# Patient Record
Sex: Female | Born: 1960 | Race: Black or African American | Hispanic: No | Marital: Married | Smoking: Former smoker
Health system: Southern US, Community
[De-identification: ages and names within clinical notes are randomized; demographics above are authoritative.]

## PROBLEM LIST (undated history)

## (undated) DIAGNOSIS — E785 Hyperlipidemia, unspecified: Secondary | ICD-10-CM

## (undated) DIAGNOSIS — E119 Type 2 diabetes mellitus without complications: Secondary | ICD-10-CM

## (undated) DIAGNOSIS — I509 Heart failure, unspecified: Secondary | ICD-10-CM

## (undated) DIAGNOSIS — I10 Essential (primary) hypertension: Secondary | ICD-10-CM

## (undated) DIAGNOSIS — F32A Depression, unspecified: Secondary | ICD-10-CM

## (undated) DIAGNOSIS — F419 Anxiety disorder, unspecified: Secondary | ICD-10-CM

## (undated) HISTORY — DX: Essential (primary) hypertension: I10

## (undated) HISTORY — DX: Depression, unspecified: F32.A

## (undated) HISTORY — DX: Heart failure, unspecified: I50.9

## (undated) HISTORY — DX: Type 2 diabetes mellitus without complications: E11.9

## (undated) HISTORY — PX: TUBAL LIGATION: SHX77

## (undated) HISTORY — DX: Hyperlipidemia, unspecified: E78.5

## (undated) HISTORY — DX: Anxiety disorder, unspecified: F41.9

---

## 2003-11-03 ENCOUNTER — Other Ambulatory Visit: Payer: Self-pay

## 2009-02-18 ENCOUNTER — Inpatient Hospital Stay: Payer: Self-pay | Admitting: Internal Medicine

## 2010-12-21 ENCOUNTER — Emergency Department: Payer: Self-pay | Admitting: Emergency Medicine

## 2010-12-23 ENCOUNTER — Observation Stay: Payer: Self-pay | Admitting: Internal Medicine

## 2011-01-05 ENCOUNTER — Emergency Department: Payer: Self-pay | Admitting: Emergency Medicine

## 2011-01-12 ENCOUNTER — Ambulatory Visit: Payer: Self-pay | Admitting: Internal Medicine

## 2011-03-03 ENCOUNTER — Emergency Department: Payer: Self-pay | Admitting: Emergency Medicine

## 2012-11-05 ENCOUNTER — Observation Stay: Payer: Self-pay | Admitting: Specialist

## 2012-11-05 LAB — CBC WITH DIFFERENTIAL/PLATELET
Basophil #: 0.1 10*3/uL (ref 0.0–0.1)
HCT: 43.2 % (ref 35.0–47.0)
Lymphocyte #: 1.8 10*3/uL (ref 1.0–3.6)
MCH: 34.5 pg — ABNORMAL HIGH (ref 26.0–34.0)
MCHC: 35.5 g/dL (ref 32.0–36.0)
MCV: 97 fL (ref 80–100)
Monocyte %: 7 %
Neutrophil #: 3.7 10*3/uL (ref 1.4–6.5)
Neutrophil %: 61.5 %
RBC: 4.44 10*6/uL (ref 3.80–5.20)
RDW: 13.4 % (ref 11.5–14.5)

## 2012-11-05 LAB — COMPREHENSIVE METABOLIC PANEL
Albumin: 4.4 g/dL (ref 3.4–5.0)
Anion Gap: 7 (ref 7–16)
BUN: 16 mg/dL (ref 7–18)
Bilirubin,Total: 0.4 mg/dL (ref 0.2–1.0)
Calcium, Total: 9.3 mg/dL (ref 8.5–10.1)
Glucose: 140 mg/dL — ABNORMAL HIGH (ref 65–99)
Potassium: 3.7 mmol/L (ref 3.5–5.1)
SGOT(AST): 28 U/L (ref 15–37)
SGPT (ALT): 28 U/L (ref 12–78)
Sodium: 135 mmol/L — ABNORMAL LOW (ref 136–145)
Total Protein: 8.8 g/dL — ABNORMAL HIGH (ref 6.4–8.2)

## 2012-11-05 LAB — TROPONIN I
Troponin-I: <0.02 ng/mL
Troponin-I: <0.02 ng/mL

## 2012-11-05 LAB — CK TOTAL AND CKMB (NOT AT ARMC)
CK, Total: 317 U/L — ABNORMAL HIGH (ref 21–215)
CK-MB: 4.3 ng/mL — ABNORMAL HIGH (ref 0.5–3.6)

## 2012-11-06 LAB — CK TOTAL AND CKMB (NOT AT ARMC): CK-MB: 2 ng/mL (ref 0.5–3.6)

## 2014-07-20 IMAGING — CT CT HEAD WITHOUT CONTRAST
2 series · 16 of 30 positions shown, 20 images · non-contrast
Comparison: none

REASON FOR EXAM: Headache, Syncope
COMMENTS:

PROCEDURE:     CT  - CT HEAD WITHOUT CONTRAST  - November 05, 2012 [DATE]
RESULT:     Technique: Helical 5mm sections were obtained from the skull
base to the vertex without administration of intravenous contrast.

[Series 2: without · axial · non-contrast · 0.39mm/px · z∈[-162,-36]mm · 13 of 31 slices shown, 17 images]
[im 3/31  brain]
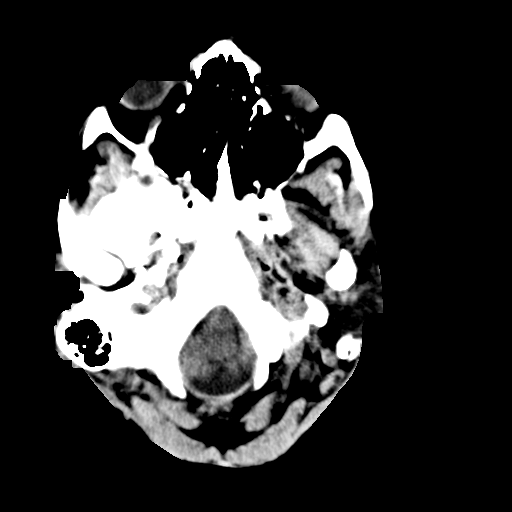
[im 3/31  bone]
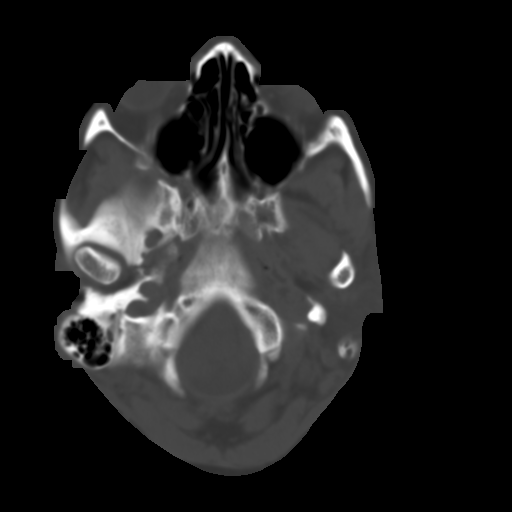
[im 5/31  brain]
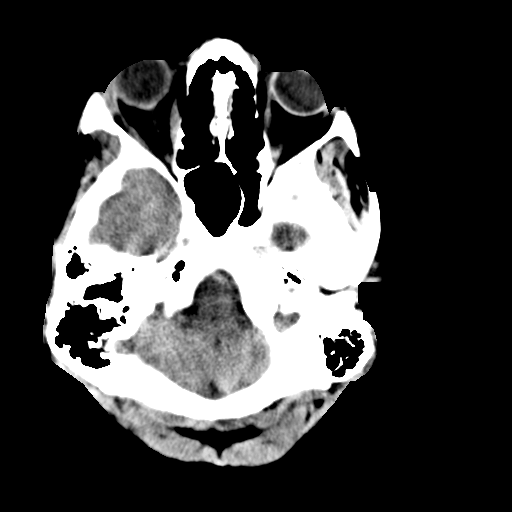
[im 7/31  brain]
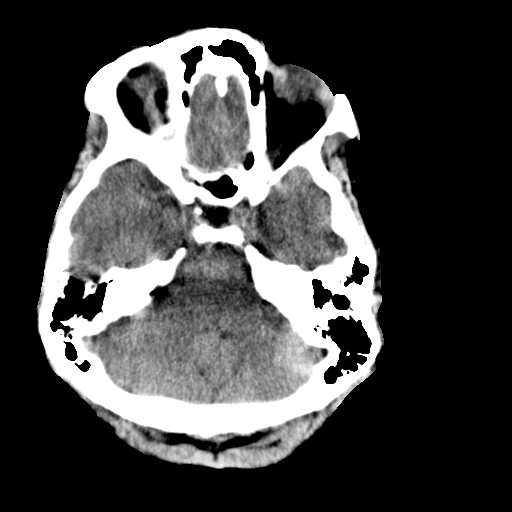
[im 9/31  brain]
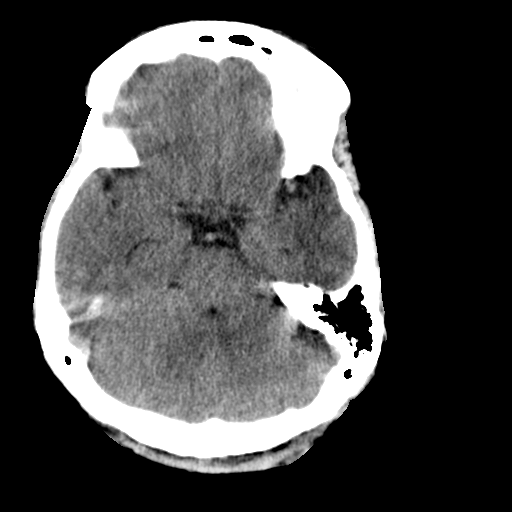
[im 11/31  brain]
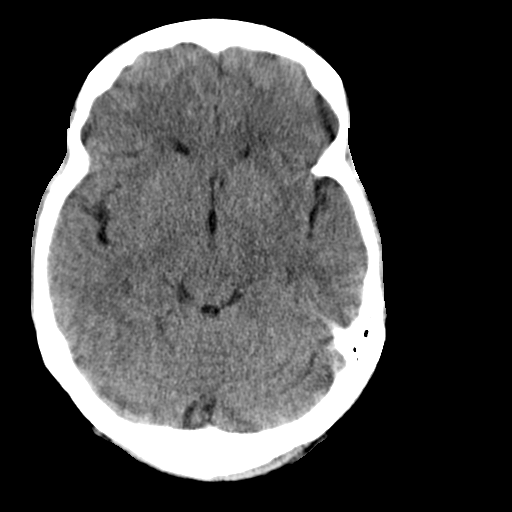
[im 11/31  bone]
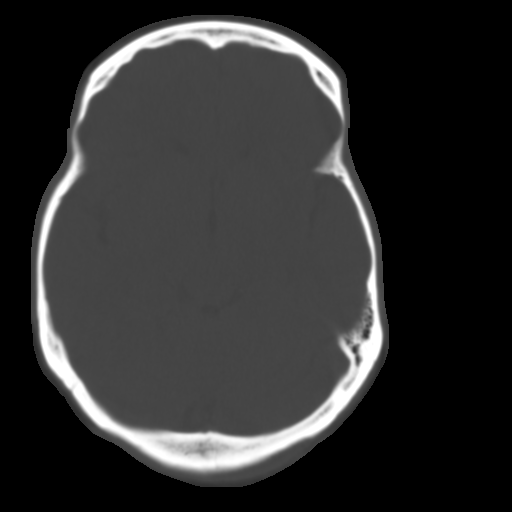
[im 13/31  brain]
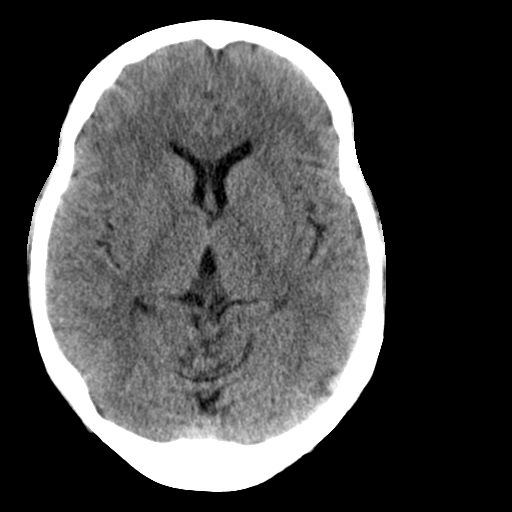
[im 16/31  brain]
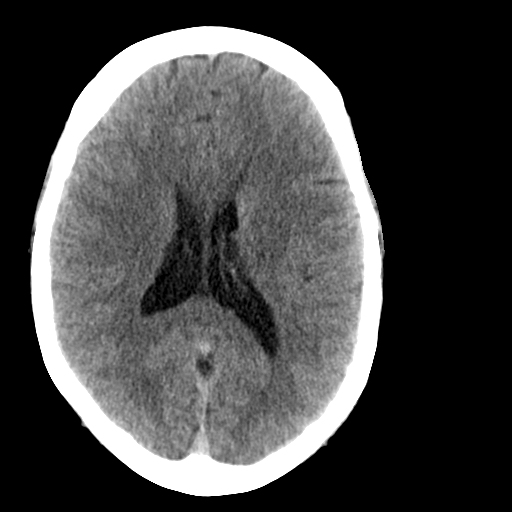
[im 18/31  brain]
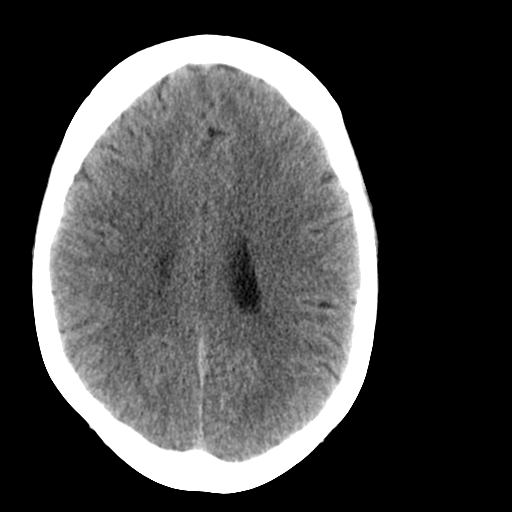
[im 20/31  brain]
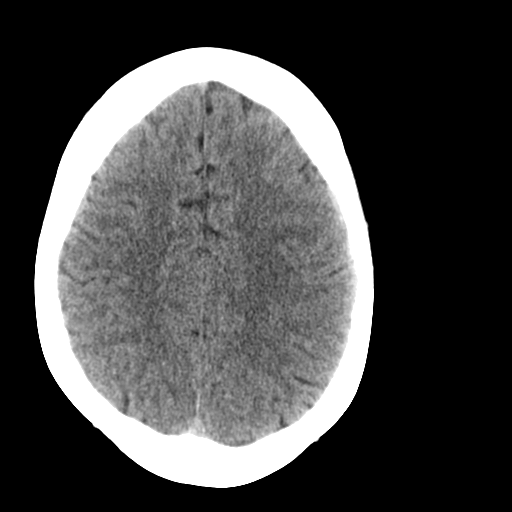
[im 20/31  bone]
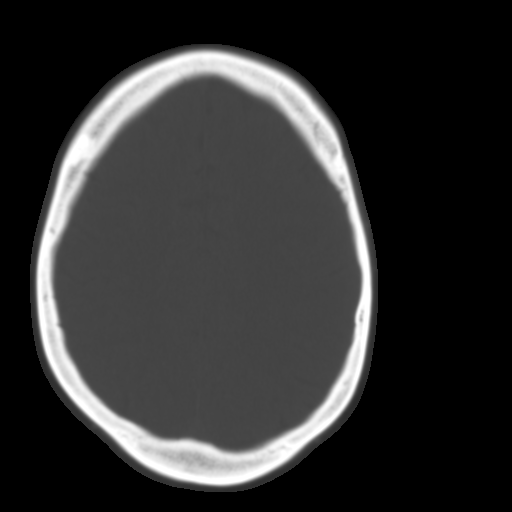
[im 22/31  brain]
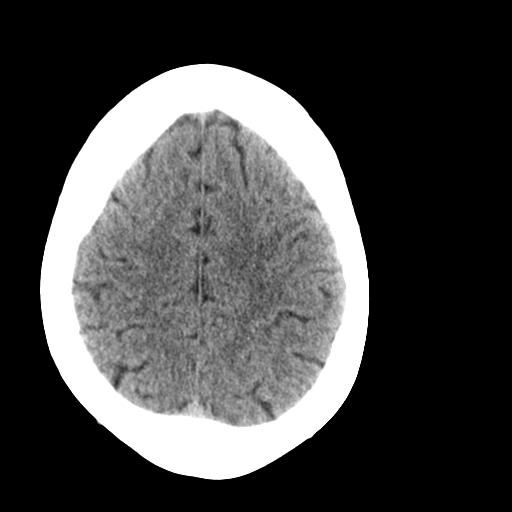
[im 24/31  brain]
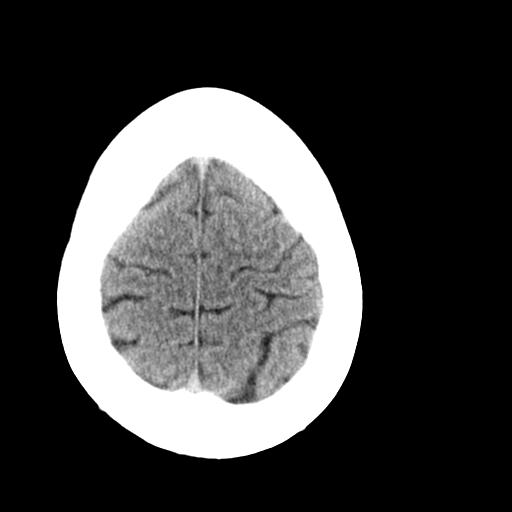
[im 26/31  brain]
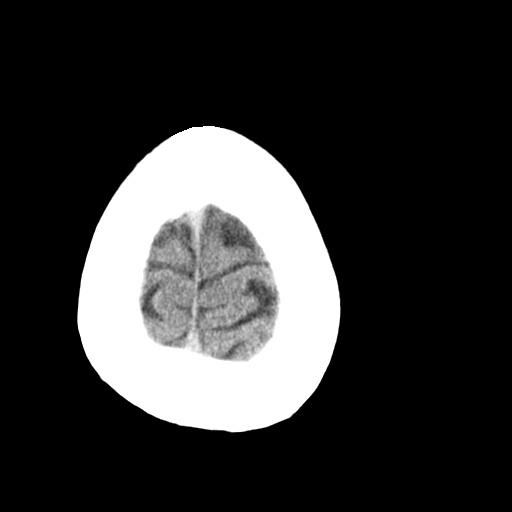
[im 28/31  brain]
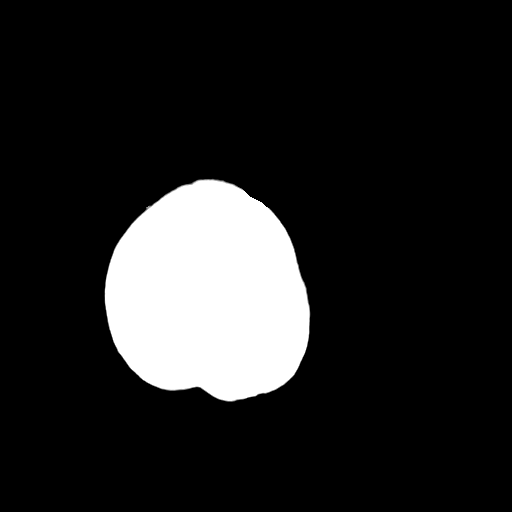
[im 28/31  bone]
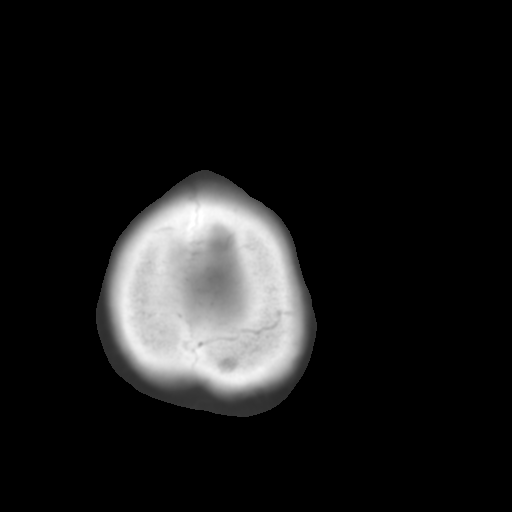

[Series 3: bone · axial · 0.39mm/px · z∈[-162,-122]mm · 3 of 31 slices shown]
[im 3/31  bone]
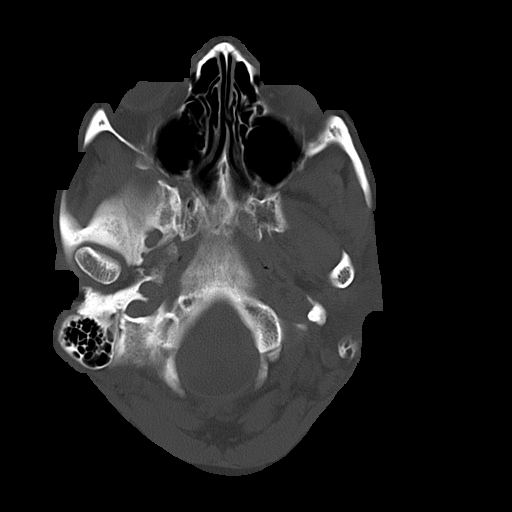
[im 7/31  bone]
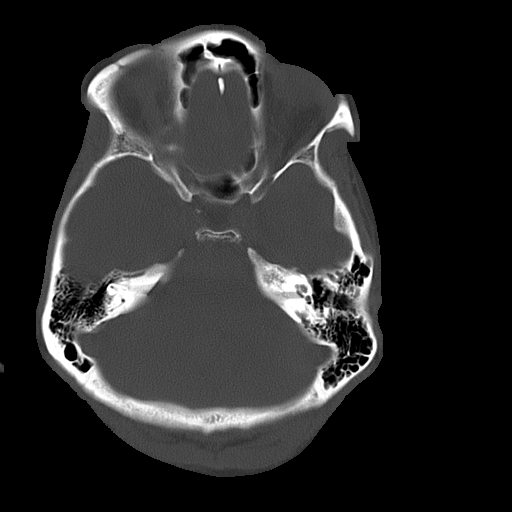
[im 11/31  bone]
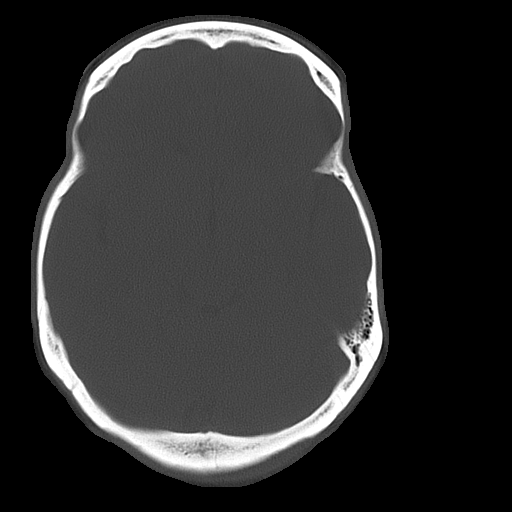

[16 of 30 positions shown; findings below may reference images not displayed]

FINDINGS: There is not evidence of intra-axial fluid collections. There is
no evidence of acute hemorrhage or secondary signs reflecting mass effect or
subacute or chronic focal territorial infarction. The osseous structures
demonstrate no evidence of a depressed skull fracture. If there is
persistent concern clinical follow-up with MRI is recommended.

Visualized paranasal sinuses sinuses and mastoid air cells are patent.
IMPRESSION: 1. No evidence of acute intracranial abnormalities.

## 2015-03-31 NOTE — Discharge Summary (Signed)
PATIENT NAME:  Jane Morrison, Jane Morrison MR#:  [id number] DATE OF BIRTH:  1961/07/25  DATE OF ADMISSION:  11/05/2012 DATE OF DISCHARGE:  11/06/2012  For a detailed note, please take a look at the history and physical done on admission.   DISCHARGE DIAGNOSES: 1. Syncope, likely vasovagal in nature. 2. History of hypertrophic cardiomyopathy.  3. Hypertension.  4. Tobacco abuse.   DIET: The patient is being discharged on a low sodium diet.   ACTIVITY: As tolerated.   DISCHARGE FOLLOWUP: Follow up with Dr. Toy CookeyErnest Eason in the next 1 to 2 weeks.   DISCHARGE MEDICATIONS: 1. Amlodipine 5 mg daily.  2. Aspirin 81 mg daily.  3. Hydrochlorothiazide/losartan 12.5/50 mg one tab daily.  4. Ramipril 10 mg daily.   PERTINENT STUDIES DURING HOSPITAL COURSE: CT of the head done without contrast on admission showed no acute intracranial abnormalities.   Carotid duplex showed no evidence of any hemodynamically significant carotid stenosis.   Two-dimensional echocardiogram showed no thrombus and no evidence of idiopathic hypertrophic subaortic stenosis, ejection fraction of 50 to 55%, and left ventricular systolic function to be normal.   BRIEF HOSPITAL COURSE: This is a 54 year old female who presented to the hospital with a syncopal episode.  1. Syncope. Most likely cause of the patient's syncope was vasovagal in nature. She had an extensive work-up during the hospitalization including CT of the head which was negative, a carotid duplex was negative, and two-dimensional echocardiogram showing no evidence of any left ventricular systolic dysfunction. She also was observed on telemetry and had no evidence of any arrhythmias, her cardiac markers x3 were negative, and she was not noted to be orthostatic. She had no further episodes of syncope and therefore was discharged to home.  2. Hypertension. The patient remained hemodynamically stable in the hospital. She will resume amlodipine, hydrochlorothiazide, losartan,  and ramipril as stated.  3. Tobacco abuse. The patient was strongly advised to quit smoking and was maintained on a nicotine patch while in the hospital.   CODE STATUS: THE PATIENT IS A FULL CODE.   TIME SPENT ON DISCHARGE: 35 minutes. ____________________________ Rolly PancakeVivek J. Cherlynn KaiserSainani, MD vjs:slb D: 11/06/2012 16:22:43 ET T: 11/07/2012 10:53:00 ET JOB#: [id number]  cc: Rolly PancakeVivek J. Cherlynn KaiserSainani, MD, <Dictator> Serita ShellerErnest B. Maryellen PileEason, MD [place] SirenVIVEK J SAINANI MD ELECTRONICALLY SIGNED 11/09/2012 14:58

## 2015-03-31 NOTE — H&P (Signed)
PATIENT NAME:  Jane Morrison, Jane Morrison MR#:  [id number] DATE OF BIRTH:  30-Apr-1961  DATE OF ADMISSION:  11/05/2012  PRIMARY CARE PHYSICIAN: Dr. Maryellen PileEason   CARDIOLOGIST: Dr. Dorothyann Pengwayne Callwood   CHIEF COMPLAINT: Syncope.   HISTORY OF PRESENT ILLNESS: This is a 54 year old female who presents to the Emergency Room after a syncopal episode she had at work. The patient said she woke up this morning with a headache, did not think much of it, still went to work. She works at [employer]. She was in her usual state doing fine except for the headache. She started to feel a little bit lightheaded shortly after she got to work. She apparently was working near and area which had an oven which was extremely hot. Shortly after when she was near the oven with the extreme temperature and heat she felt a bit more lightheaded and had a syncopal episode. The next thing she remembers is people at work attempting to call 9-1-1. She did not want to be brought by ambulance as she was more awake and coherent shortly after the episode and her husband brought her to the ER. She has had no other symptoms since then. She denied any chest pain, any palpitations, any nausea, vomiting, any blurry vision, any numbness, tingling, or any other associated symptoms. The patient does have a history of hypertrophic cardiomyopathy. Hospitalist service was contacted for further treatment and evaluation.   REVIEW OF SYSTEMS: CONSTITUTIONAL: No documented fever. No weight gain, no weight loss. EYES: No blurry or double vision. ENT: No tinnitus. No postnasal drip. No redness of the oropharynx. RESPIRATORY: No cough, no wheeze, no hemoptysis, no dyspnea. CARDIOVASCULAR: No chest pain, no orthopnea, no palpitations. Positive syncope. GI: No nausea, no vomiting, no diarrhea, no abdominal pain, no melena, no hematochezia. GU: No dysuria, no hematuria. ENDOCRINE: No polyuria or nocturia. No heat or cold intolerance. HEME: No anemia, no bruising, and bleeding.  INTEGUMENTARY: No rashes. No lesions. MUSCULOSKELETAL: No arthritis, no swelling, no gout. NEUROLOGIC: No numbness, no tingling, no ataxia, no seizure-type activity. PSYCH: No anxiety, no insomnia, no ADD.   PAST MEDICAL HISTORY:  1. Hypertension and hypertrophic cardiomyopathy.  2. Tobacco abuse.   ALLERGIES: Beta-blockers and clonidine which causes low heart rate.   SOCIAL HISTORY: Does smoke about 3/4 pack per day, has been smoking for the past 20+ years. Also drinks about a sixpack of beer daily. No other illicit drug abuse. Lives at home with her husband.   FAMILY HISTORY: Mother and father are both alive. Father is currently in Hospice secondary to COPD. Mother has diabetes and high blood pressure.   CURRENT MEDICATIONS:  1. Aspirin 81 mg daily.  2. Amlodipine 5 mg daily.  3. HCTZ/losartan 12.5/50 one tab daily.  4. Ramipril 10 mg daily.   PHYSICAL EXAMINATION ON ADMISSION:   VITAL SIGNS: Temperature 97.4, pulse 84, respirations 18, blood pressure 136/88, sats 98% on room air.   GENERAL: The patient is a pleasant appearing female in no apparent distress.   HEENT: Atraumatic, normocephalic. Extraocular muscles are intact. Pupils equal, reactive to light. Sclerae anicteric. No conjunctival injection. No pharyngeal erythema.   NECK: Supple. No jugular venous distention, no bruits, no lymphadenopathy, no thyromegaly.   HEART: Regular rate and rhythm. No murmurs, no rubs, no clicks.   LUNGS: Clear to auscultation bilaterally. No rales, no rhonchi, no wheezes.   ABDOMEN: Soft, flat, nontender, nondistended. Has good bowel sounds. No hepatosplenomegaly appreciated.   EXTREMITIES: No evidence of any cyanosis, clubbing,  or peripheral edema. Has +2 pedal and radial pulses bilaterally.   NEUROLOGICAL: The patient is alert, awake and oriented x3 with no focal motor or sensory deficits appreciated bilaterally.   SKIN: Moist and warm with no rash appreciated.   LYMPHATIC: There is no  cervical or axillary lymphadenopathy.   LABORATORY, DIAGNOSTIC, AND RADIOLOGICAL DATA: Serum glucose 140, BUN 16, creatinine 1.01, sodium 135, potassium 3.7, chloride 101, bicarb 27. LFTs are within normal limits. Troponin less than 0.02. White cell count 6.1, hemoglobin 15.3, hematocrit 43.2, platelet count 292.   The patient did have an EKG done which shows normal sinus rhythm with left axis deviation and LVH BI voltage criteria but no other acute ST-T wave changes.   ASSESSMENT AND PLAN: This is a 54 year old female with past medical history of hypertrophic cardiomyopathy and hypertension who presents to the hospital with a syncopal episode.  1. Syncope. The exact etiology of syncope is unclear but supposedly is vasovagal. The patient does have a history of hypertrophic cardiomyopathy as mentioned, therefore, I will observe her on telemetry. Follow serial cardiac markers. Will get a two-dimensional echocardiogram. Since she also had a headache, I will go ahead and get a CT of the head without contrast and also do a carotid duplex. Continue aspirin. Check a lipid profile. Her orthostatics were negative in the ER. Again, most likely the cause of her syncope is likely vasovagal in nature.  2. Hypertension, presently hemodynamically stable. Can continue Ramipril, Norvasc, losartan/HCTZ. There was no evidence of orthostasis in the ER.  3. Tobacco abuse. I strongly advised her to quit smoking. Counseled for 1 to 3 minutes. I will place her on nicotine patch.   CODE STATUS: The patient is a FULL CODE.   TIME SPENT: 45 minutes.   ____________________________ Rolly Pancake. Cherlynn Kaiser, MD vjs:drc D: 11/05/2012 10:05:24 ET T: 11/05/2012 11:27:18 ET JOB#: [id number]  cc: Rolly Pancake. Cherlynn Kaiser, MD, <Dictator>, Serita Sheller. Maryellen Pile, MD [place] Siren MD ELECTRONICALLY SIGNED 11/06/2012 8:17

## 2017-07-07 ENCOUNTER — Emergency Department: Payer: Managed Care, Other (non HMO)

## 2017-07-07 ENCOUNTER — Encounter: Payer: Self-pay | Admitting: Emergency Medicine

## 2017-07-07 ENCOUNTER — Emergency Department
Admission: EM | Admit: 2017-07-07 | Discharge: 2017-07-07 | Disposition: A | Discharge status: Home / Self Care | Payer: Managed Care, Other (non HMO) | Attending: Student in an Organized Health Care Education/Training Program | Admitting: Student in an Organized Health Care Education/Training Program

## 2017-07-07 ENCOUNTER — Other Ambulatory Visit: Payer: Self-pay

## 2017-07-07 DIAGNOSIS — F17210 Nicotine dependence, cigarettes, uncomplicated: Secondary | ICD-10-CM | POA: Diagnosis not present

## 2017-07-07 DIAGNOSIS — R079 Chest pain, unspecified: Secondary | ICD-10-CM

## 2017-07-07 LAB — CBC
HCT: 40.3 % (ref 35.0–47.0)
HEMOGLOBIN: 13.9 g/dL (ref 12.0–16.0)
MCH: 32 pg (ref 26.0–34.0)
MCHC: 34.6 g/dL (ref 32.0–36.0)
MCV: 92.6 fL (ref 80.0–100.0)
Platelets: 267 10*3/uL (ref 150–440)
RBC: 4.35 MIL/uL (ref 3.80–5.20)
RDW: 13.2 % (ref 11.5–14.5)
WBC: 6 10*3/uL (ref 3.6–11.0)

## 2017-07-07 LAB — BASIC METABOLIC PANEL
ANION GAP: 10 (ref 5–15)
BUN: 11 mg/dL (ref 6–20)
CALCIUM: 9.3 mg/dL (ref 8.9–10.3)
CO2: 24 mmol/L (ref 22–32)
Chloride: 104 mmol/L (ref 101–111)
Creatinine, Ser: 1.01 mg/dL — ABNORMAL HIGH (ref 0.44–1.00)
GFR calc Af Amer: >60 mL/min (ref >60)
GFR calc non Af Amer: >60 mL/min (ref >60)
GLUCOSE: 161 mg/dL — AB (ref 65–99)
Potassium: 3.7 mmol/L (ref 3.5–5.1)
Sodium: 138 mmol/L (ref 135–145)

## 2017-07-07 LAB — TROPONIN I

## 2017-07-07 LAB — FIBRIN DERIVATIVES D-DIMER (ARMC ONLY): Fibrin derivatives D-dimer (ARMC): 434.1 (ref 0.00–499.00)

## 2017-07-07 MED ORDER — TRAMADOL HCL 50 MG PO TABS
ORAL_TABLET | ORAL | Status: AC
  Administered 2017-07-07: 50 mg via ORAL
  Filled 2017-07-07: qty 1

## 2017-07-07 MED ORDER — TRAMADOL HCL 50 MG PO TABS
50.0000 mg | ORAL_TABLET | Freq: Once | ORAL | Status: AC
  Administered 2017-07-07: 50 mg via ORAL

## 2017-07-07 MED ORDER — TRAMADOL HCL 50 MG PO TABS: 50.0000 mg | ORAL_TABLET | Freq: Four times a day (QID) | ORAL | 0 refills | Status: AC | PRN

## 2017-07-07 NOTE — ED Provider Notes (Signed)
[facility] Emergency Department Provider Note    First MD Initiated Contact with Patient 07/07/17 1559     (approximate)  I have reviewed the triage vital signs and the nursing notes.   HISTORY  Chief Complaint Chest Pain    HPI Jane Morrison is a 56 y.o. female previously healthy female who does smoke daily presents with midsternal nonradiating chest pain and pressure for the past 3 days. Patient states she's had similar discomfort which reported a muscle or cholesterol lifting boxes. Patient states that initially she did have some pain when taking a deep breath that resolved. Denies any pain radiating through to her back or up into her jaw. There is no associated diaphoresis or nausea or vomiting. Denies any abdominal pain area no previous history of heart attacks. She does not have any history of hypercholesterol hypertension or diabetes. Describes the pain is mild nagging sensation. States that she did take an aspirin with improvement in symptoms.   History reviewed. No pertinent past medical history. No family history on file. Past Surgical History:  Procedure Laterality Date  . TUBAL LIGATION     There are no active problems to display for this patient.     Prior to Admission medications   Medication Sig Start Date End Date Taking? Authorizing Provider  traMADol (ULTRAM) 50 MG tablet Take 1 tablet (50 mg total) by mouth every 6 (six) hours as needed. 07/07/17 07/07/18  Willy Eddyobinson, Kaden Dunkel, MD    Allergies Patient has no known allergies.    Social History Social History  Substance Use Topics  . Smoking status: Current Every Day Smoker    Types: Cigarettes  . Smokeless tobacco: Never Used  . Alcohol use No    Review of Systems Patient denies headaches, rhinorrhea, blurry vision, numbness, shortness of breath, chest pain, edema, cough, abdominal pain, nausea, vomiting, diarrhea, dysuria, fevers, rashes or hallucinations unless otherwise  stated above in HPI. ____________________________________________   PHYSICAL EXAM:  VITAL SIGNS: Vitals:   07/07/17 1341  BP: (!) 168/88  Pulse: 78  Resp: 16  Temp: 98.9 F (37.2 C)    Constitutional: Alert and oriented. Well appearing and in no acute distress. Eyes: Conjunctivae are normal.  Head: Atraumatic. Nose: No congestion/rhinnorhea. Mouth/Throat: Mucous membranes are moist.   Neck: No stridor. Painless ROM.  Cardiovascular: Normal rate, regular rhythm. Grossly normal heart sounds.  Good peripheral circulation. Respiratory: Normal respiratory effort.  No retractions. Lungs CTAB. Gastrointestinal: Soft and nontender. No distention. No abdominal bruits. No CVA tenderness. Genitourinary:  Musculoskeletal: No lower extremity tenderness nor edema.  No joint effusions. Neurologic:  Normal speech and language. No gross focal neurologic deficits are appreciated. No facial droop Skin:  Skin is warm, dry and intact. No rash noted. Psychiatric: Mood and affect are normal. Speech and behavior are normal.  ____________________________________________   LABS (all labs ordered are listed, but only abnormal results are displayed)  Results for orders placed or performed during the hospital encounter of 07/07/17 (from the past 24 hour(s))  Basic metabolic panel     Status: Abnormal   Collection Time: 07/07/17  1:40 PM  Result Value Ref Range   Sodium 138 135 - 145 mmol/L   Potassium 3.7 3.5 - 5.1 mmol/L   Chloride 104 101 - 111 mmol/L   CO2 24 22 - 32 mmol/L   Glucose, Bld 161 (H) 65 - 99 mg/dL   BUN 11 6 - 20 mg/dL   Creatinine, Ser 3.241.01 (H) 0.44 -  1.00 mg/dL   Calcium 9.3 8.9 - 60.410.3 mg/dL   GFR calc non Af Amer >60 >60 mL/min   GFR calc Af Amer >60 >60 mL/min   Anion gap 10 5 - 15  CBC     Status: None   Collection Time: 07/07/17  1:40 PM  Result Value Ref Range   WBC 6.0 3.6 - 11.0 K/uL   RBC 4.35 3.80 - 5.20 MIL/uL   Hemoglobin 13.9 12.0 - 16.0 g/dL   HCT 54.040.3 98.135.0  - 19.147.0 %   MCV 92.6 80.0 - 100.0 fL   MCH 32.0 26.0 - 34.0 pg   MCHC 34.6 32.0 - 36.0 g/dL   RDW 47.813.2 29.511.5 - 62.114.5 %   Platelets 267 150 - 440 K/uL  Troponin I     Status: None   Collection Time: 07/07/17  1:40 PM  Result Value Ref Range   Troponin I <0.03 <0.03 ng/mL  Fibrin derivatives D-Dimer ([facility] only)     Status: None   Collection Time: 07/07/17  4:27 PM  Result Value Ref Range   Fibrin derivatives D-dimer ([facility]) 434.10 0.00 - 499.00   ____________________________________________  EKG My review and personal interpretation at Time: 13:39   Indication: chest pain  Rate: 75  Rhythm: sinus Axis: normal Other: rbbb, non specific t wave changes, no stemi or depressions ____________________________________________  RADIOLOGY  I personally reviewed all radiographic images ordered to evaluate for the above acute complaints and reviewed radiology reports and findings.  These findings were personally discussed with the patient.  Please see medical record for radiology report. ____________________________________________   PROCEDURES  Procedure(s) performed:  Procedures    Critical Care performed: no ____________________________________________   INITIAL IMPRESSION / ASSESSMENT AND PLAN / ED COURSE  Pertinent labs & imaging results that were available during my care of the patient were reviewed by me and considered in my medical decision making (see chart for details).  DDX: ACS, pericarditis, esophagitis, boerhaaves, pe, dissection, pna, bronchitis, costochondritis    Jane Morrison is a 56 y.o. who presents to the ED with chest pain for the past 3 days as described above. Patient well-appearing and in no acute distress. EKG does show some nonspecific ST and T-wave changes with an incomplete right bundle but do not appreciated any STEMI or ST depressions suggest active ischemia. Her troponin is negative. I would suspect this for ACS would have an elevated troponin after 3  days of chest pain.  Patient has a heart score of 3 (01110).  Patient is low risk by well's score. We'll order a d-dimer to further risk stratify she was describing some pleuritic pain. She is well perfused. Her abdominal exam is benign. Do not suspect any intra-abdominal pathology causing her chest discomfort.  The patient will be placed on continuous pulse oximetry and telemetry for monitoring.  Laboratory evaluation will be sent to evaluate for the above complaints.       ----------------------------------------- 5:37 PM on 07/07/2017 -----------------------------------------  D-dimer is negative. Patient remains symptomatically stable and in no acute distress. Do not feel this is consistent with unstable angina at this time. I do feel the patient is appropriate for outpatient follow-up with cardiology based on her low risk heart score and reassuring workup thus far. Discussed strict return precautions.  Have discussed with the patient and available family all diagnostics and treatments performed thus far and all questions were answered to the best of my ability. The patient demonstrates understanding and agreement with plan.  ____________________________________________   FINAL CLINICAL IMPRESSION(S) / ED DIAGNOSES  Final diagnoses:  Chest pain, unspecified type      NEW MEDICATIONS STARTED DURING THIS VISIT:  New Prescriptions   TRAMADOL (ULTRAM) 50 MG TABLET    Take 1 tablet (50 mg total) by mouth every 6 (six) hours as needed.     Note:  This document was prepared using Dragon voice recognition software and may include unintentional dictation errors.    Willy Eddy, MD 07/07/17 [phone]

## 2017-07-07 NOTE — ED Triage Notes (Signed)
C/O mid / left chest tightness x 3 days.

## 2018-06-18 ENCOUNTER — Other Ambulatory Visit: Payer: Self-pay | Admitting: Pediatrics

## 2018-06-18 DIAGNOSIS — Z1231 Encounter for screening mammogram for malignant neoplasm of breast: Secondary | ICD-10-CM

## 2018-07-02 ENCOUNTER — Encounter (INDEPENDENT_AMBULATORY_CARE_PROVIDER_SITE_OTHER): Payer: Self-pay

## 2018-07-02 ENCOUNTER — Ambulatory Visit
Admission: RE | Admit: 2018-07-02 | Discharge: 2018-07-02 | Disposition: A | Discharge status: Home / Self Care | Payer: 59 | Source: Ambulatory Visit | Attending: Pediatrics | Admitting: Pediatrics

## 2018-07-02 DIAGNOSIS — Z1231 Encounter for screening mammogram for malignant neoplasm of breast: Secondary | ICD-10-CM | POA: Diagnosis not present

## 2018-09-24 DIAGNOSIS — E119 Type 2 diabetes mellitus without complications: Secondary | ICD-10-CM | POA: Insufficient documentation

## 2018-09-27 ENCOUNTER — Other Ambulatory Visit: Payer: Self-pay | Admitting: Pediatrics

## 2018-09-27 DIAGNOSIS — E049 Nontoxic goiter, unspecified: Secondary | ICD-10-CM

## 2018-10-02 ENCOUNTER — Ambulatory Visit
Admission: RE | Admit: 2018-10-02 | Discharge: 2018-10-02 | Disposition: A | Discharge status: Home / Self Care | Payer: 59 | Source: Ambulatory Visit | Attending: Pediatrics | Admitting: Pediatrics

## 2018-10-02 DIAGNOSIS — E049 Nontoxic goiter, unspecified: Secondary | ICD-10-CM | POA: Diagnosis present

## 2019-01-18 IMAGING — US US THYROID
1 series · 14 of 25 positions shown · non-contrast
Comparison: None.

CLINICAL DATA: Goiter.

EXAM:
THYROID ULTRASOUND
TECHNIQUE: Ultrasound examination of the thyroid gland and adjacent soft
tissues was performed.

[Series 1: us thyroid · 0.07mm/px · 14 of 47 slices shown]
[im 1/47]
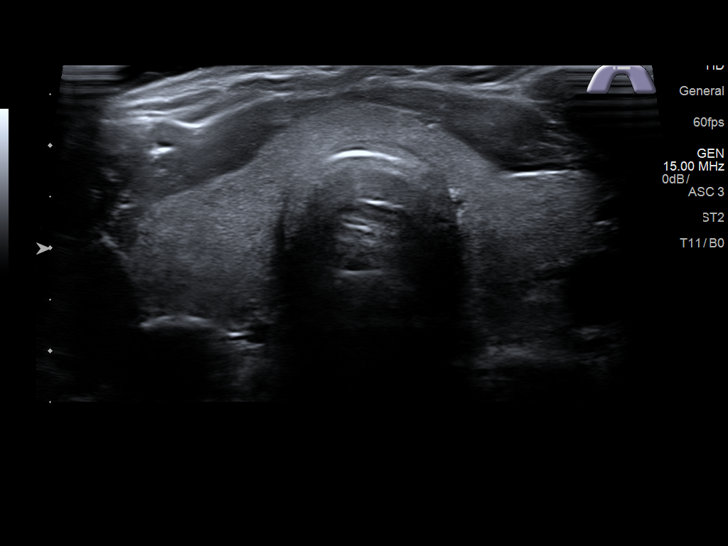
[im 4/47]
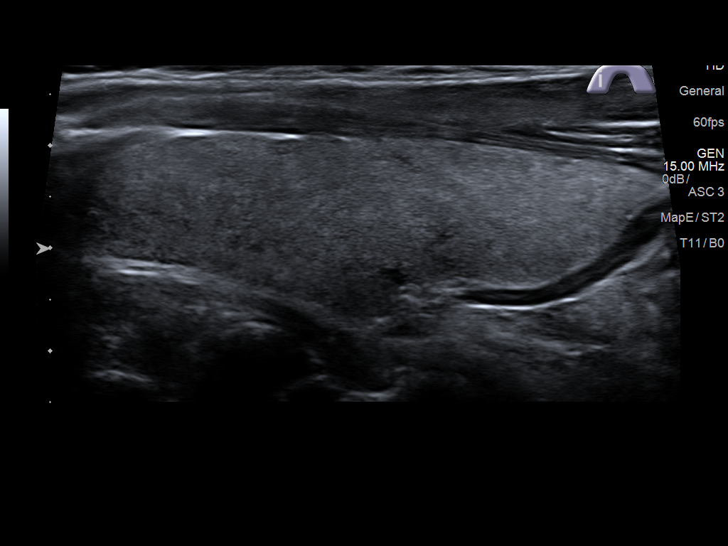
[im 8/47]
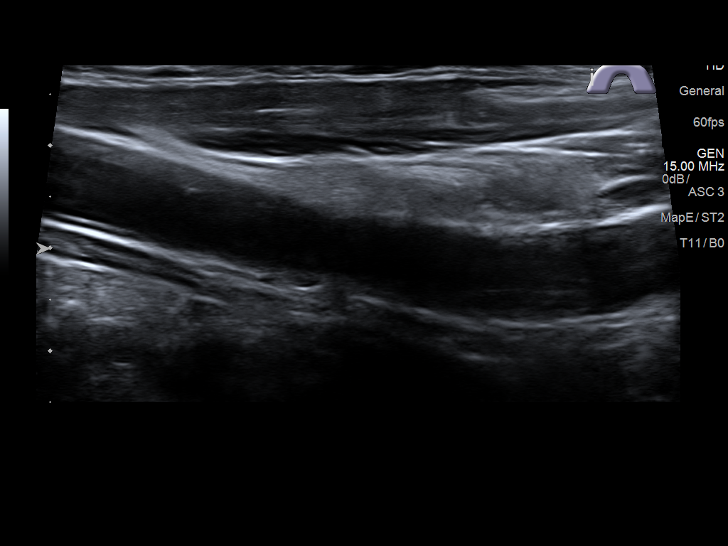
[im 12/47]
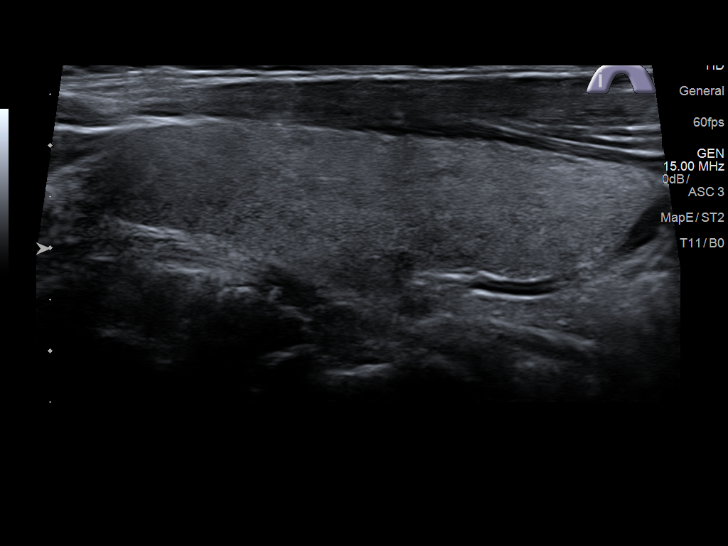
[im 16/47]
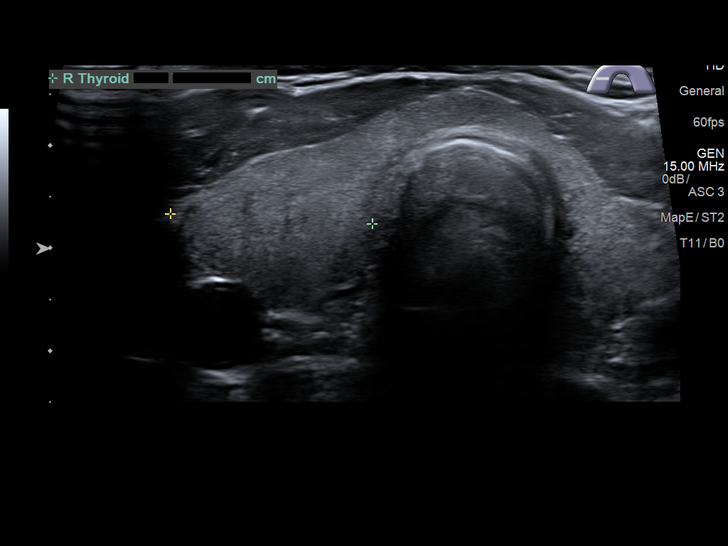
[im 18/47]
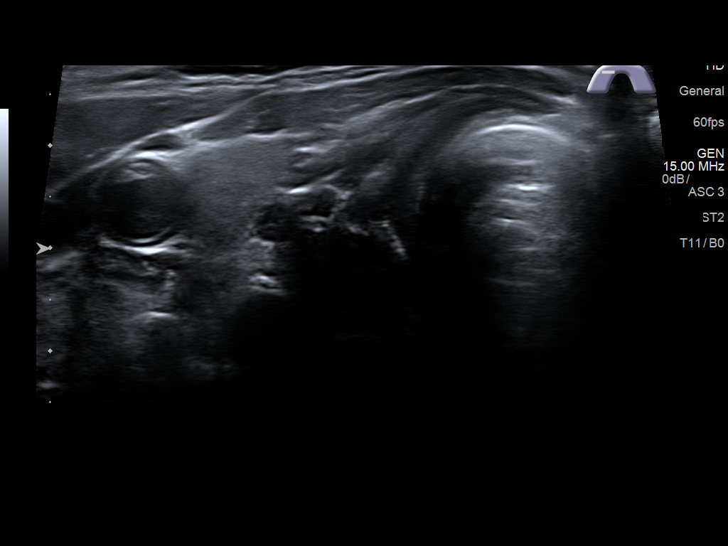
[im 22/47]
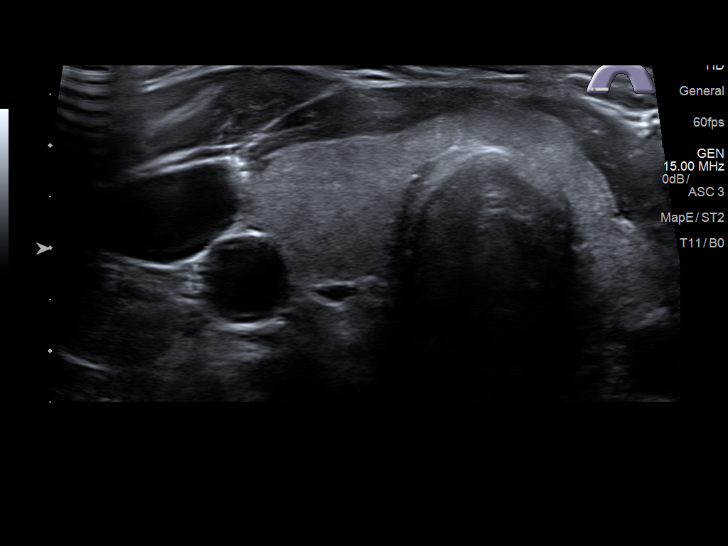
[im 25/47]
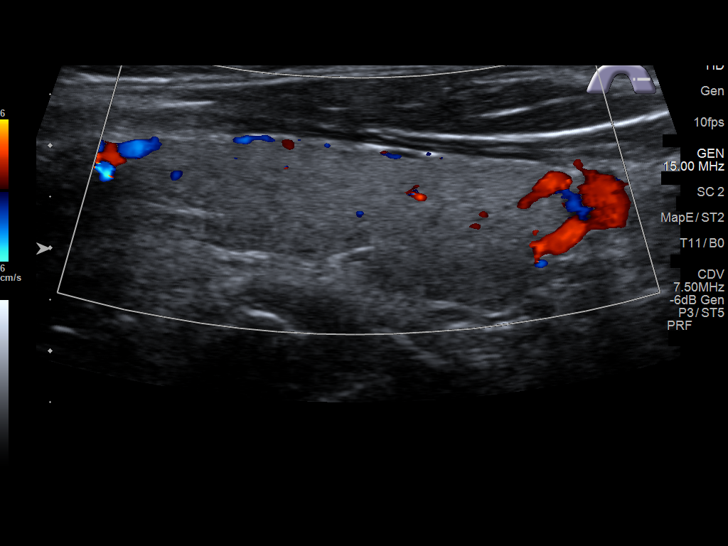
[im 29/47]
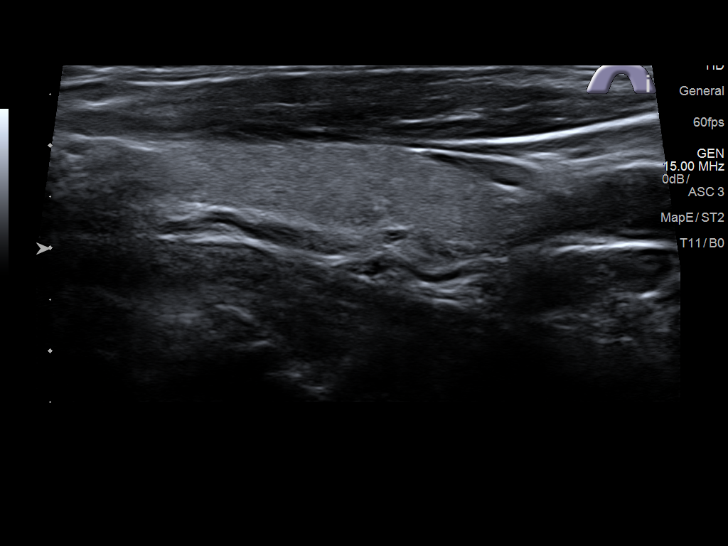
[im 31/47]
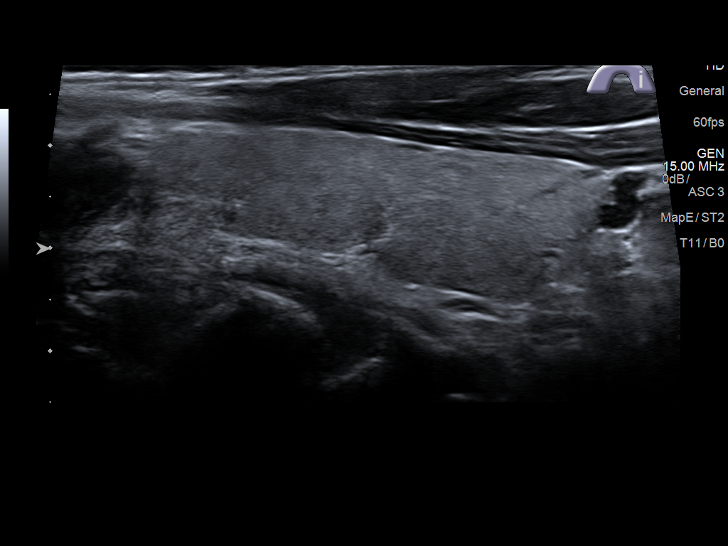
[im 35/47]
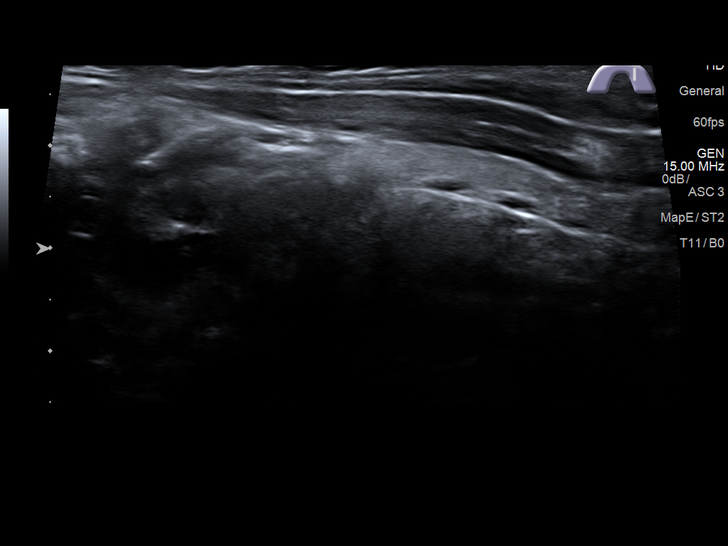
[im 39/47]
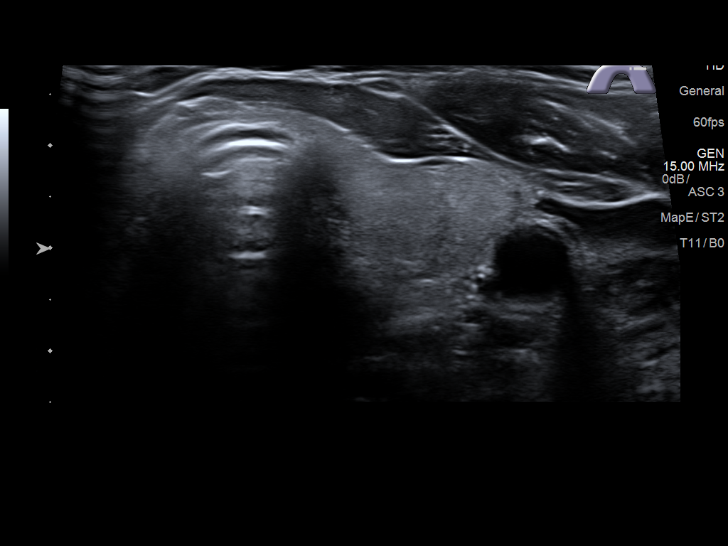
[im 43/47]
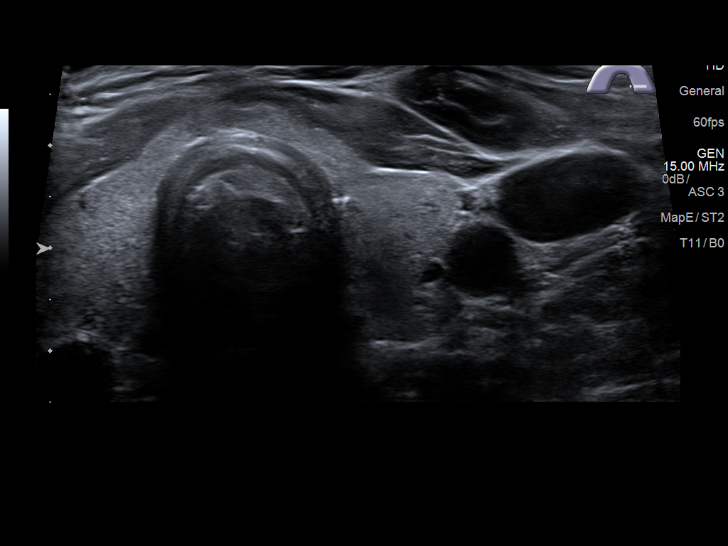
[im 47/47]
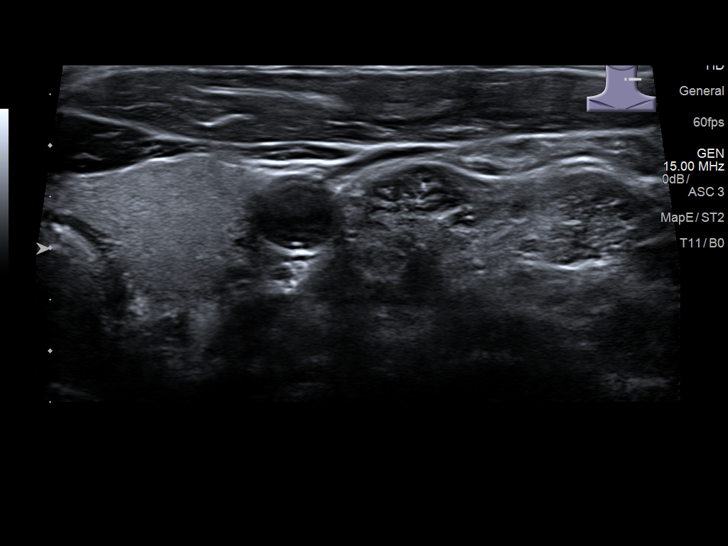

[14 of 25 positions shown; findings below may reference images not displayed]

FINDINGS: Parenchymal Echotexture: Mildly heterogenous

Isthmus: 0.4 cm

Right lobe: 5.7 x 1.7 x 2.0 cm

Left lobe: 4.6 x 1.5 x 1.5 cm

_________________________________________________________

Estimated total number of nodules >/= 1 cm: 0

Number of spongiform nodules >/=  2 cm not described below (TR1): 0

Number of mixed cystic and solid nodules >/= 1.5 cm not described
below (TR2): 0

_________________________________________________________

No discrete nodules are seen within the thyroid gland.
IMPRESSION: Mildly enlarged and heterogeneous thyroid gland without discrete
nodule.

## 2019-04-05 DIAGNOSIS — I10 Essential (primary) hypertension: Secondary | ICD-10-CM | POA: Insufficient documentation

## 2021-05-27 ENCOUNTER — Other Ambulatory Visit (HOSPITAL_COMMUNITY): Payer: Self-pay | Admitting: Pediatrics

## 2021-05-27 ENCOUNTER — Other Ambulatory Visit: Payer: Self-pay | Admitting: Pediatrics

## 2021-05-27 DIAGNOSIS — E040 Nontoxic diffuse goiter: Secondary | ICD-10-CM

## 2022-06-13 ENCOUNTER — Ambulatory Visit (INDEPENDENT_AMBULATORY_CARE_PROVIDER_SITE_OTHER): Payer: 59 | Admitting: Nurse Practitioner

## 2022-06-13 ENCOUNTER — Other Ambulatory Visit: Payer: Self-pay

## 2022-06-13 ENCOUNTER — Encounter: Payer: Self-pay | Admitting: Nurse Practitioner

## 2022-06-13 VITALS — BP 144/86 | HR 96 | Temp 98.6°F | Resp 18 | Ht 66.5 in | Wt 125.8 lb

## 2022-06-13 DIAGNOSIS — Z13220 Encounter for screening for lipoid disorders: Secondary | ICD-10-CM

## 2022-06-13 DIAGNOSIS — Z1159 Encounter for screening for other viral diseases: Secondary | ICD-10-CM

## 2022-06-13 DIAGNOSIS — R634 Abnormal weight loss: Secondary | ICD-10-CM | POA: Diagnosis not present

## 2022-06-13 DIAGNOSIS — E119 Type 2 diabetes mellitus without complications: Secondary | ICD-10-CM | POA: Diagnosis not present

## 2022-06-13 DIAGNOSIS — F17200 Nicotine dependence, unspecified, uncomplicated: Secondary | ICD-10-CM

## 2022-06-13 DIAGNOSIS — Z7689 Persons encountering health services in other specified circumstances: Secondary | ICD-10-CM

## 2022-06-13 DIAGNOSIS — Z1231 Encounter for screening mammogram for malignant neoplasm of breast: Secondary | ICD-10-CM

## 2022-06-13 DIAGNOSIS — I10 Essential (primary) hypertension: Secondary | ICD-10-CM

## 2022-06-13 DIAGNOSIS — Z1211 Encounter for screening for malignant neoplasm of colon: Secondary | ICD-10-CM

## 2022-06-13 DIAGNOSIS — Z114 Encounter for screening for human immunodeficiency virus [HIV]: Secondary | ICD-10-CM

## 2022-06-13 DIAGNOSIS — Z122 Encounter for screening for malignant neoplasm of respiratory organs: Secondary | ICD-10-CM

## 2022-06-13 NOTE — Assessment & Plan Note (Addendum)
Discussed smoking cessation.  Getting lab work.  If appropriate will start Chantix and nicotine patches.

## 2022-06-13 NOTE — Assessment & Plan Note (Addendum)
Patient's last A1c was 10.3 on 04/29/2021.  Patient used to take metformin at 1000 mg twice daily but has been out.  We will get labs today.  Plan is to restart metformin after checking kidney function.

## 2022-06-13 NOTE — Assessment & Plan Note (Signed)
Patient's blood pressure is elevated.  She was taking Benicar 40-25 mg daily.  But she has not been taking this medication for a while.  We will send in blood pressure medication after getting lab results.

## 2022-06-13 NOTE — Progress Notes (Signed)
BP (!) 144/86   Pulse 96   Temp 98.6 F (37 C) (Oral)   Resp 18   Ht 5' 6.5" (1.689 m)   Wt 125 lb 12.8 oz (57.1 kg)   SpO2 98%   BMI 20.00 kg/m    Subjective:    Patient ID: Jane Morrison, female    DOB: Mar 29, 1961, 61 y.o.   MRN: [id number]  HPI: Jane Morrison is a 61 y.o. female  Chief Complaint  Patient presents with   Establish Care   Weight Loss    Has lost 50 lbs   Diabetes   Establish care: She says her last physical was years ago. She does have a history of diabetes, and hypertension. She was on medication but has been for a while.   Weight loss:  She says over the last six months she has lost about 50 lbs.  She  says she was at 160-170 lbs and is now 125 lbs.  She denies any nausea, vomiting and diarrhea. Discussed getting labs, screening tests and increasing caloric intake.   Diabetes:  She says that she has had diabetes for many years she was taking metformin 1000 mg BID.  She says that she does not check her blood sugar at home. She says she has to drink water all the time. Patient reports polyuria and polydipsia.  Last A1C was 10.3 on 04/29/2021. Patient has been out of medication for a while.   Hypertension: She says she does not check her blood pressure. She denies any chest pain, shortness of breath, headaches and blurred vision.  She used to take Benicar 40-25 mg daily.  She says she did have a reaction to lisinopril.  She has been out of medication for awhile.  Her blood pressure today was 142/86, retake was 144/86.  Will start her on medication after getting lab work back.   Tobacco dependence:  She smokes about a pack every two day.  She has tried to stop smoking in the past. She is interested in smoking cessation.  Will discuss further after getting lab results.   Relevant past medical, surgical, family and social history reviewed and updated as indicated. Interim medical history since our last visit reviewed. Allergies and medications reviewed and  updated.  Review of Systems  Constitutional: Negative for fever, positive for weight loss Respiratory: Negative for cough and shortness of breath.   Cardiovascular: Negative for chest pain or palpitations.  Gastrointestinal: Negative for abdominal pain, no bowel changes.  Musculoskeletal: Negative for gait problem or joint swelling.  Skin: Negative for rash.  Neurological: Negative for dizziness or headache.  No other specific complaints in a complete review of systems (except as listed in HPI above).      Objective:    BP (!) 144/86   Pulse 96   Temp 98.6 F (37 C) (Oral)   Resp 18   Ht 5' 6.5" (1.689 m)   Wt 125 lb 12.8 oz (57.1 kg)   SpO2 98%   BMI 20.00 kg/m   Wt Readings from Last 3 Encounters:  06/13/22 125 lb 12.8 oz (57.1 kg)  07/07/17 155 lb (70.3 kg)    Physical Exam  Constitutional: Patient appears well-developed and well-nourished. No distress.  HEENT: head atraumatic, normocephalic, pupils equal and reactive to light, neck supple Cardiovascular: Normal rate, regular rhythm and normal heart sounds.  No murmur heard. No BLE edema. Pulmonary/Chest: Effort normal and breath sounds normal. No respiratory distress. Abdominal: Soft.  There is no  tenderness. Psychiatric: Patient has a normal mood and affect. behavior is normal. Judgment and thought content normal.   Diabetic Foot Exam - Simple   Simple Foot Form Visual Inspection No deformities, no ulcerations, no other skin breakdown bilaterally: Yes Sensation Testing Intact to touch and monofilament testing bilaterally: Yes Pulse Check Posterior Tibialis and Dorsalis pulse intact bilaterally: Yes Comments     Assessment & Plan:   Problem List Items Addressed This Visit       Cardiovascular and Mediastinum   Essential hypertension    Patient's blood pressure is elevated.  She was taking Benicar 40-25 mg daily.  But she has not been taking this medication for a while.  We will send in blood pressure  medication after getting lab results.       Relevant Orders   CBC with Differential/Platelet   COMPLETE METABOLIC PANEL WITH GFR     Endocrine   Type 2 diabetes mellitus without complication, without long-term current use of insulin (HCC)    Patient's last A1c was 10.3 on 04/29/2021.  Patient used to take metformin at 1000 mg twice daily but has been out.  We will get labs today.  Plan is to restart metformin after checking kidney function.      Relevant Orders   COMPLETE METABOLIC PANEL WITH GFR   Hemoglobin A1c   Microalbumin / creatinine urine ratio   HM Diabetes Foot Exam (Completed)     Other   Tobacco dependence    Discussed smoking cessation.  Getting lab work.  If appropriate will start Chantix and nicotine patches.      Relevant Orders   Ambulatory Referral for Lung Cancer Scre   Other Visit Diagnoses     Weight loss    -  Primary   She reports she is lost 50 pounds in the last 6 months.  Getting labs, screening tests.  Discussed increasing caloric intake.   Relevant Orders   MM Digital Screening   Ambulatory referral to Gastroenterology   Ambulatory Referral for Lung Cancer Scre   CBC with Differential/Platelet   COMPLETE METABOLIC PANEL WITH GFR   Hemoglobin A1c   Hepatitis C antibody   HIV Antibody (routine testing w rflx)   TSH   Encounter to establish care       Schedule for CPE   Screening for HIV without presence of risk factors       Relevant Orders   HIV Antibody (routine testing w rflx)   Encounter for hepatitis C screening test for low risk patient       Relevant Orders   Hepatitis C antibody   Screening for lung cancer       Relevant Orders   Ambulatory Referral for Lung Cancer Scre   Encounter for screening mammogram for malignant neoplasm of breast       Relevant Orders   MM Digital Screening   Screening for colon cancer       Relevant Orders   Ambulatory referral to Gastroenterology   Screening for cholesterol level       Relevant  Orders   Lipid panel        Follow up plan: Return in about 3 months (around 09/13/2022) for follow up.

## 2022-06-14 ENCOUNTER — Telehealth (INDEPENDENT_AMBULATORY_CARE_PROVIDER_SITE_OTHER): Payer: 59 | Admitting: Family Medicine

## 2022-06-14 LAB — CBC WITH DIFFERENTIAL/PLATELET
Absolute Monocytes: 352 cells/uL (ref 200–950)
Basophils Absolute: 32 cells/uL (ref 0–200)
Basophils Relative: 0.4 %
Eosinophils Absolute: 8 cells/uL — ABNORMAL LOW (ref 15–500)
Eosinophils Relative: 0.1 %
HCT: 39.9 % (ref 35.0–45.0)
Hemoglobin: 13 g/dL (ref 11.7–15.5)
Lymphs Abs: 2896 cells/uL (ref 850–3900)
MCH: 31.1 pg (ref 27.0–33.0)
MCHC: 32.6 g/dL (ref 32.0–36.0)
MCV: 95.5 fL (ref 80.0–100.0)
MPV: 9.9 fL (ref 7.5–12.5)
Monocytes Relative: 4.4 %
Neutro Abs: 4712 cells/uL (ref 1500–7800)
Neutrophils Relative %: 58.9 %
Platelets: 338 10*3/uL (ref 140–400)
RBC: 4.18 10*6/uL (ref 3.80–5.10)
RDW: 11.8 % (ref 11.0–15.0)
Total Lymphocyte: 36.2 %
WBC: 8 10*3/uL (ref 3.8–10.8)

## 2022-06-14 LAB — LIPID PANEL
Cholesterol: 161 mg/dL (ref <200)
HDL: 75 mg/dL (ref >=50)
LDL Cholesterol (Calc): 67 mg/dL (calc)
Non-HDL Cholesterol (Calc): 86 mg/dL (calc) (ref <130)
Total CHOL/HDL Ratio: 2.1 (calc) (ref <5.0)
Triglycerides: 104 mg/dL (ref <150)

## 2022-06-14 LAB — HEMOGLOBIN A1C: Hgb A1c MFr Bld: >14 % of total Hgb — ABNORMAL HIGH (ref <5.7)

## 2022-06-14 LAB — COMPLETE METABOLIC PANEL WITH GFR
AG Ratio: 1.4 (calc) (ref 1.0–2.5)
ALT: 25 U/L (ref 6–29)
AST: 19 U/L (ref 10–35)
Albumin: 4.3 g/dL (ref 3.6–5.1)
Alkaline phosphatase (APISO): 100 U/L (ref 37–153)
BUN/Creatinine Ratio: 12 (calc) (ref 6–22)
BUN: 15 mg/dL (ref 7–25)
CO2: 27 mmol/L (ref 20–32)
Calcium: 9.7 mg/dL (ref 8.6–10.4)
Chloride: 92 mmol/L — ABNORMAL LOW (ref 98–110)
Creat: 1.27 mg/dL — ABNORMAL HIGH (ref 0.50–1.03)
Globulin: 3.1 g/dL (calc) (ref 1.9–3.7)
Glucose, Bld: 573 mg/dL (ref 65–99)
Potassium: 5.5 mmol/L — ABNORMAL HIGH (ref 3.5–5.3)
Sodium: 126 mmol/L — ABNORMAL LOW (ref 135–146)
Total Bilirubin: 0.4 mg/dL (ref 0.2–1.2)
Total Protein: 7.4 g/dL (ref 6.1–8.1)
eGFR: 49 mL/min/{1.73_m2} — ABNORMAL LOW (ref >=60)

## 2022-06-14 LAB — TSH: TSH: 0.56 mIU/L (ref 0.40–4.50)

## 2022-06-14 LAB — HEPATITIS C ANTIBODY: Hepatitis C Ab: NONREACTIVE

## 2022-06-14 LAB — HIV ANTIBODY (ROUTINE TESTING W REFLEX): HIV 1&2 Ab, 4th Generation: NONREACTIVE

## 2022-06-14 LAB — MICROALBUMIN / CREATININE URINE RATIO
Creatinine, Urine: 19 mg/dL — ABNORMAL LOW (ref 20–275)
Microalb, Ur: <0.2 mg/dL

## 2022-06-15 ENCOUNTER — Other Ambulatory Visit: Payer: Self-pay | Admitting: Nurse Practitioner

## 2022-06-15 DIAGNOSIS — E1165 Type 2 diabetes mellitus with hyperglycemia: Secondary | ICD-10-CM

## 2022-06-15 DIAGNOSIS — E119 Type 2 diabetes mellitus without complications: Secondary | ICD-10-CM

## 2022-06-15 DIAGNOSIS — I10 Essential (primary) hypertension: Secondary | ICD-10-CM

## 2022-06-15 MED ORDER — ACCU-CHEK SOFTCLIX LANCETS MISC: 12 refills | Status: DC

## 2022-06-15 MED ORDER — INSULIN PEN NEEDLE 32G X 6 MM MISC: 1.0000 | Freq: Every day | 1 refills | Status: DC

## 2022-06-15 MED ORDER — GVOKE HYPOPEN 1-PACK 0.5 MG/0.1ML ~~LOC~~ SOAJ: 0.5000 mg | Freq: Once | SUBCUTANEOUS | 3 refills | Status: AC | PRN

## 2022-06-15 MED ORDER — METFORMIN HCL 500 MG PO TABS: 500.0000 mg | ORAL_TABLET | Freq: Two times a day (BID) | ORAL | 3 refills | Status: DC

## 2022-06-15 MED ORDER — LOSARTAN POTASSIUM 25 MG PO TABS: 25.0000 mg | ORAL_TABLET | Freq: Every day | ORAL | 0 refills | Status: DC

## 2022-06-15 MED ORDER — BLOOD GLUCOSE TEST VI STRP: ORAL_STRIP | 3 refills | Status: DC

## 2022-06-15 MED ORDER — BLOOD GLUCOSE MONITOR KIT: PACK | 0 refills | Status: AC

## 2022-06-15 MED ORDER — TRESIBA FLEXTOUCH 100 UNIT/ML ~~LOC~~ SOPN: 10.0000 [IU] | PEN_INJECTOR | Freq: Every day | SUBCUTANEOUS | 3 refills | Status: DC

## 2022-06-15 NOTE — Telephone Encounter (Signed)
Call with lab results of blood sugar of 567.  She also had a sodium of 126.. I attempted to call the patient 3 times and went to voicemail each time.  Left 2 messages, told her to go to the ED if she felt sick at all. In reviewing her chart it had been many months since she had sought medical care and been off her medication so up and walk around like this for some time.  I told her on the message if she did not go to the ED to call the office in the next few days for possible further medication adjustment

## 2022-06-15 NOTE — Telephone Encounter (Signed)
Thanks

## 2022-06-16 ENCOUNTER — Other Ambulatory Visit: Payer: Self-pay

## 2022-06-16 ENCOUNTER — Telehealth: Payer: Self-pay

## 2022-06-16 DIAGNOSIS — Z1211 Encounter for screening for malignant neoplasm of colon: Secondary | ICD-10-CM

## 2022-06-16 MED ORDER — NA SULFATE-K SULFATE-MG SULF 17.5-3.13-1.6 GM/177ML PO SOLN: 1.0000 | Freq: Once | ORAL | 0 refills | Status: AC

## 2022-06-16 NOTE — Telephone Encounter (Signed)
Gastroenterology Pre-Procedure Review  Request Date: 06/29/22 Requesting Physician: Dr. Vicente Males  PATIENT REVIEW QUESTIONS: The patient responded to the following health history questions as indicated:    1. Are you having any GI issues? no 2. Do you have a personal history of Polyps? no 3. Do you have a family history of Colon Cancer or Polyps? no 4. Diabetes Mellitus? yes (type 2 oral diabetic meds and insulin ) 5. Joint replacements in the past 12 months?no 6. Major health problems in the past 3 months?no, however patient stated that she has lost a total of 50 pounds since 2020.  Contributes the weight loss to having calcium deficiency that caused her to loose all her teeth making her unable to eat anything because she had a dental infection that effected her entire system 7. Any artificial heart valves, MVP, or defibrillator?no    MEDICATIONS & ALLERGIES:    Patient reports the following regarding taking any anticoagulation/antiplatelet therapy:   Plavix, Coumadin, Eliquis, Xarelto, Lovenox, Pradaxa, Brilinta, or Effient? no Aspirin? no  Patient confirms/reports the following medications:  Current Outpatient Medications  Medication Sig Dispense Refill   Accu-Chek Softclix Lancets lancets Use as instructed 100 each 12   blood glucose meter kit and supplies KIT Dispense based on patient and insurance preference. Use up to four times daily as directed. (FOR ICD-9 250.00, 250.01). 1 each 0   Glucagon (GVOKE HYPOPEN 1-PACK) 0.5 MG/0.1ML SOAJ Inject 0.5 mg into the skin once as needed for up to 1 dose (for hypoglycemia). 0.1 mL 3   Glucose Blood (BLOOD GLUCOSE TEST STRIPS) STRP Use as directed to monitor FSBS once daily for DM 100 strip 3   insulin degludec (TRESIBA FLEXTOUCH) 100 UNIT/ML FlexTouch Pen Inject 10 Units into the skin daily. 3 mL 3   Insulin Pen Needle 32G X 6 MM MISC 1 each by Does not apply route daily. 100 each 1   losartan (COZAAR) 25 MG tablet Take 1 tablet (25 mg total) by  mouth daily. 90 tablet 0   metFORMIN (GLUCOPHAGE) 500 MG tablet Take 1 tablet (500 mg total) by mouth 2 (two) times daily with a meal. 180 tablet 3   No current facility-administered medications for this visit.    Patient confirms/reports the following allergies:  No Known Allergies  No orders of the defined types were placed in this encounter.   AUTHORIZATION INFORMATION Primary Insurance: 1D#: Group #:  Secondary Insurance: 1D#: Group #:  SCHEDULE INFORMATION: Date: 06/29/22 Time: Location: [facility]

## 2022-06-23 ENCOUNTER — Ambulatory Visit: Payer: Self-pay | Admitting: *Deleted

## 2022-06-23 ENCOUNTER — Telehealth: Payer: Self-pay | Admitting: *Deleted

## 2022-06-23 ENCOUNTER — Other Ambulatory Visit: Payer: Self-pay | Admitting: Nurse Practitioner

## 2022-06-23 DIAGNOSIS — E1165 Type 2 diabetes mellitus with hyperglycemia: Secondary | ICD-10-CM

## 2022-06-23 DIAGNOSIS — E119 Type 2 diabetes mellitus without complications: Secondary | ICD-10-CM

## 2022-06-23 DIAGNOSIS — I10 Essential (primary) hypertension: Secondary | ICD-10-CM

## 2022-06-23 NOTE — Telephone Encounter (Signed)
Called pharmacy to verify dose needed for insulins patient is requesting. Spoke with Trinna Post regarding tresibaflextouch insulin degludec 100 unit/ml felx touch pen. Packages come in 15 ml and patient only had 3 refills and has used up refills due to order was for only 3 ml on 06/15/22. Glucagon( Gvoke hypopen 1 pack) 0.5mg /0.65ml soaj comes in 0.2 packages and will need refill.

## 2022-06-23 NOTE — Telephone Encounter (Signed)
Call to pharmacy- [facility]) checked records and does not have Rx sent in 7/5 on file. Will resend both Requested Prescriptions  Pending Prescriptions Disp Refills  . TRESIBA FLEXTOUCH 100 UNIT/ML FlexTouch Pen [Pharmacy Med Name: TRESIBA FLEXTOUCH 100 UNIT/ML] 3 mL 3    Sig: INJECT 10 UNITS INTO THE SKIN DAILY     Endocrinology:  Diabetes - Insulins Failed - 06/23/2022  8:43 AM      Failed - HBA1C is between 0 and 7.9 and within 180 days    Hgb A1c MFr Bld  Date Value Ref Range Status  06/13/2022 >14.0 (H) <5.7 % of total Hgb Final    Comment:    Verified by repeat analysis. . For someone without known diabetes, a hemoglobin A1c value of 6.5% or greater indicates that they may have  diabetes and this should be confirmed with a follow-up  test. . For someone with known diabetes, a value <7% indicates  that their diabetes is well controlled and a value  greater than or equal to 7% indicates suboptimal  control. A1c targets should be individualized based on  duration of diabetes, age, comorbid conditions, and  other considerations. . Currently, no consensus exists regarding use of hemoglobin A1c for diagnosis of diabetes for children. Verna Czech - Valid encounter within last 6 months    Recent Outpatient Visits          1 week ago Weight loss   [facility] [place], Raynelle Fanning F, [place]             . losartan (COZAAR) 25 MG tablet [Pharmacy Med Name: LOSARTAN POTASSIUM 25 MG TAB] 90 tablet 0    Sig: TAKE 1 TABLET (25 MG TOTAL) BY MOUTH DAILY.     Cardiovascular:  Angiotensin Receptor Blockers Failed - 06/23/2022  8:43 AM      Failed - Cr in normal range and within 180 days    Creat  Date Value Ref Range Status  06/13/2022 1.27 (H) 0.50 - 1.03 mg/dL Final   Creatinine, Urine  Date Value Ref Range Status  06/13/2022 19 (L) 20 - 275 mg/dL Final         Failed - K in normal range and within 180 days    Potassium  Date Value Ref Range Status   06/13/2022 5.5 (H) 3.5 - 5.3 mmol/L Final  11/05/2012 3.7 3.5 - 5.1 mmol/L Final         Failed - Last BP in normal range    BP Readings from Last 1 Encounters:  06/13/22 (!) 144/86         Passed - Patient is not pregnant      Passed - Valid encounter within last 6 months    Recent Outpatient Visits          1 week ago Weight loss   [facility] [place], Rudolpho Sevin, [place]

## 2022-06-23 NOTE — Telephone Encounter (Signed)
Summary: pt out of insulin/ unclear on refill plan as new diabetic/new pt also     RE:(TRESIBA FLEXTOUCH) 100 UNIT/ML FlexTouch Pen &  Glucagon (GVOKE HYPOPEN 1-PACK) 0.5 MG/0.1ML SOAJ   Please FU with Pt she is a new pt and seems to be a new diabetic, She said she was given starter insulin pack at appt and she was completely out, I thought she had refills and advised to call pharmacy. They told her she had no refills and would have to wait till the 18th. It does look like by calling pharmacy a script was generated . She needs a fu call as states this is all new to her and wanted to make sure she is clear on this since according to her she ran out 5 days early. Call to advise pt [phone]       Called patient to review requests for insulin: tresiba and glucagon. No answer, LVMTCB [phone].called pharmacy to verify orders needed. See previous encounter. Patient ordered medication but has used up refills. Patient is new diabetic and will need additional orders. Patient needs refills on tresiba and Gvoke due to dosing packaging more than what was ordered on 06/15/22. Pharmacy reports patient has refills on metformin 500 mg  but does not have refills on insulin degludec Evaristo Bury Flextouch) 100 unit/ ml flextouch pen. Comes in packages of 15 ml and only 3 ml ordered on 06/15/22. Glucagon (Gvoke hypopen 1 - pack) 0.5mg / 0,1 ml soaj come in packages on 0.2 and will need refills due to taking 1/2 dose if needed. Please advise.

## 2022-06-23 NOTE — Telephone Encounter (Signed)
Patient notified refills sent to [facility]

## 2022-06-29 ENCOUNTER — Ambulatory Visit: Payer: 59 | Admitting: Anesthesiology

## 2022-06-29 ENCOUNTER — Encounter: Payer: Self-pay | Admitting: Gastroenterology

## 2022-06-29 ENCOUNTER — Encounter
Admission: RE | Disposition: A | Discharge status: Home / Self Care | Payer: Self-pay | Source: Home / Self Care | Attending: Gastroenterology

## 2022-06-29 ENCOUNTER — Ambulatory Visit
Admission: RE | Admit: 2022-06-29 | Discharge: 2022-06-29 | Disposition: A | Discharge status: Home / Self Care | Payer: 59 | Attending: Gastroenterology | Admitting: Gastroenterology

## 2022-06-29 DIAGNOSIS — E119 Type 2 diabetes mellitus without complications: Secondary | ICD-10-CM | POA: Insufficient documentation

## 2022-06-29 DIAGNOSIS — Z1211 Encounter for screening for malignant neoplasm of colon: Secondary | ICD-10-CM | POA: Diagnosis present

## 2022-06-29 DIAGNOSIS — I10 Essential (primary) hypertension: Secondary | ICD-10-CM | POA: Insufficient documentation

## 2022-06-29 DIAGNOSIS — F17210 Nicotine dependence, cigarettes, uncomplicated: Secondary | ICD-10-CM | POA: Insufficient documentation

## 2022-06-29 DIAGNOSIS — Z7984 Long term (current) use of oral hypoglycemic drugs: Secondary | ICD-10-CM | POA: Diagnosis not present

## 2022-06-29 DIAGNOSIS — K5730 Diverticulosis of large intestine without perforation or abscess without bleeding: Secondary | ICD-10-CM | POA: Insufficient documentation

## 2022-06-29 DIAGNOSIS — Z794 Long term (current) use of insulin: Secondary | ICD-10-CM | POA: Diagnosis not present

## 2022-06-29 HISTORY — PX: COLONOSCOPY WITH PROPOFOL: SHX5780

## 2022-06-29 LAB — GLUCOSE, CAPILLARY: Glucose-Capillary: 305 mg/dL — ABNORMAL HIGH (ref 70–99)

## 2022-06-29 SURGERY — COLONOSCOPY WITH PROPOFOL
Anesthesia: General

## 2022-06-29 MED ORDER — LIDOCAINE HCL (CARDIAC) PF 100 MG/5ML IV SOSY
PREFILLED_SYRINGE | INTRAVENOUS | Status: DC | PRN
  Administered 2022-06-29: 50 mg via INTRAVENOUS

## 2022-06-29 MED ORDER — MIDAZOLAM HCL 2 MG/2ML IJ SOLN
INTRAMUSCULAR | Status: DC | PRN
  Administered 2022-06-29: 2 mg via INTRAVENOUS

## 2022-06-29 MED ORDER — SODIUM CHLORIDE 0.9 % IV SOLN
INTRAVENOUS | Status: DC
  Administered 2022-06-29: 1000 mL via INTRAVENOUS

## 2022-06-29 MED ORDER — PROPOFOL 500 MG/50ML IV EMUL
INTRAVENOUS | Status: DC | PRN
  Administered 2022-06-29: 50 ug/kg/min via INTRAVENOUS

## 2022-06-29 MED ORDER — PROPOFOL 1000 MG/100ML IV EMUL
INTRAVENOUS | Status: AC
  Filled 2022-06-29: qty 100

## 2022-06-29 MED ORDER — FENTANYL CITRATE (PF) 100 MCG/2ML IJ SOLN
INTRAMUSCULAR | Status: AC
  Filled 2022-06-29: qty 2

## 2022-06-29 MED ORDER — MIDAZOLAM HCL 2 MG/2ML IJ SOLN
INTRAMUSCULAR | Status: AC
  Filled 2022-06-29: qty 2

## 2022-06-29 MED ORDER — LIDOCAINE HCL (PF) 2 % IJ SOLN
INTRAMUSCULAR | Status: AC
  Filled 2022-06-29: qty 5

## 2022-06-29 MED ORDER — PROPOFOL 10 MG/ML IV BOLUS
INTRAVENOUS | Status: DC | PRN
  Administered 2022-06-29: 40 mg via INTRAVENOUS

## 2022-06-29 NOTE — Op Note (Signed)
[facility] Gastroenterology Patient Name: Jane Morrison Procedure Date: 06/29/2022 7:20 AM MRN: [id number] Account #: [id number] Date of Birth: [birth year] Admit Type: Outpatient Age: 61 Room: [facility] ENDO ROOM 3 Gender: Female Note Status: Finalized Instrument Name: Prentice Docker [id number] Procedure:             Colonoscopy Indications:           Screening for colorectal malignant neoplasm Providers:             Wyline Mood MD, MD Referring MD:          No Local Md, MD (Referring MD) Medicines:             Monitored Anesthesia Care Complications:         No immediate complications. Procedure:             Pre-Anesthesia Assessment:                        - Prior to the procedure, a History and Physical was                         performed, and patient medications, allergies and                         sensitivities were reviewed. The patient's tolerance                         of previous anesthesia was reviewed.                        - The risks and benefits of the procedure and the                         sedation options and risks were discussed with the                         patient. All questions were answered and informed                         consent was obtained.                        - After reviewing the risks and benefits, the patient                         was deemed in satisfactory condition to undergo the                         procedure.                        - ASA Grade Assessment: II - A patient with mild                         systemic disease.                        After obtaining informed consent, the colonoscope was                         passed under direct vision.  Throughout the procedure,                         the patient's blood pressure, pulse, and oxygen                         saturations were monitored continuously. The                         Colonoscope was introduced through the anus and                         advanced  to the the cecum, identified by the                         appendiceal orifice. The colonoscopy was performed                         with ease. The patient tolerated the procedure well.                         The quality of the bowel preparation was adequate. Findings:      The perianal and digital rectal examinations were normal.      Multiple small and large-mouthed diverticula were found in the entire       colon.      The exam was otherwise without abnormality on direct and retroflexion       views. Impression:            - Diverticulosis in the entire examined colon.                        - The examination was otherwise normal on direct and                         retroflexion views.                        - No specimens collected. Recommendation:        - Discharge patient to home (with escort).                        - Resume previous diet.                        - Continue present medications.                        - Repeat colonoscopy in 10 years for screening                         purposes. Procedure Code(s):     --- Professional ---                        [phone], Colonoscopy, flexible; diagnostic, including                         collection of specimen(s) by brushing or washing, when  performed (separate procedure) Diagnosis Code(s):     --- Professional ---                        Z12.11, Encounter for screening for malignant neoplasm                         of colon                        K57.30, Diverticulosis of large intestine without                         perforation or abscess without bleeding CPT copyright 2019 American [employer] Association. All rights reserved. The codes documented in this report are preliminary and upon coder review may  be revised to meet current compliance requirements. Wyline Mood, MD Wyline Mood MD, MD 06/29/2022 8:15:20 AM This report has been signed electronically. Number of Addenda: 0 Note Initiated On:  06/29/2022 7:20 AM Scope Withdrawal Time: 0 hours 9 minutes 2 seconds  Total Procedure Duration: 0 hours 18 minutes 20 seconds  Estimated Blood Loss:  Estimated blood loss: none.      [facility]

## 2022-06-29 NOTE — Anesthesia Preprocedure Evaluation (Signed)
Anesthesia Evaluation  Patient identified by MRN, date of birth, ID band Patient awake    Reviewed: Allergy & Precautions, NPO status , Patient's Chart, lab work & pertinent test results  History of Anesthesia Complications Negative for: history of anesthetic complications  Airway Mallampati: III  TM Distance: >3 FB Neck ROM: full    Dental  (+) Lower Dentures, Upper Dentures   Pulmonary neg pulmonary ROS, Current SmokerPatient did not abstain from smoking.,    Pulmonary exam normal        Cardiovascular hypertension, negative cardio ROS Normal cardiovascular exam     Neuro/Psych PSYCHIATRIC DISORDERS negative neurological ROS     GI/Hepatic negative GI ROS, Neg liver ROS,   Endo/Other  negative endocrine ROSdiabetes  Renal/GU negative Renal ROS  negative genitourinary   Musculoskeletal   Abdominal   Peds  Hematology negative hematology ROS (+)   Anesthesia Other Findings Past Medical History: No date: Anxiety No date: Depression No date: Diabetes mellitus without complication (HCC) No date: Hyperlipidemia No date: Hypertension  Past Surgical History: No date: TUBAL LIGATION     Reproductive/Obstetrics negative OB ROS                            Anesthesia Physical Anesthesia Plan  ASA: 2  Anesthesia Plan: General   Post-op Pain Management:    Induction: Intravenous  PONV Risk Score and Plan: 3 and Propofol infusion and TIVA  Airway Management Planned: Natural Airway and Nasal Cannula  Additional Equipment:   Intra-op Plan:   Post-operative Plan:   Informed Consent: I have reviewed the patients History and Physical, chart, labs and discussed the procedure including the risks, benefits and alternatives for the proposed anesthesia with the patient or authorized representative who has indicated his/her understanding and acceptance.     Dental Advisory Given  Plan  Discussed with: Anesthesiologist, CRNA and Surgeon  Anesthesia Plan Comments: (Patient consented for risks of anesthesia including but not limited to:  - adverse reactions to medications - risk of airway placement if required - damage to eyes, teeth, lips or other oral mucosa - nerve damage due to positioning  - sore throat or hoarseness - Damage to heart, brain, nerves, lungs, other parts of body or loss of life  Patient voiced understanding.)        Anesthesia Quick Evaluation

## 2022-06-29 NOTE — Anesthesia Postprocedure Evaluation (Signed)
Anesthesia Post Note  Patient: Jane Morrison  Procedure(s) Performed: COLONOSCOPY WITH PROPOFOL  Patient location during evaluation: Endoscopy Anesthesia Type: General Level of consciousness: awake and alert Pain management: pain level controlled Vital Signs Assessment: post-procedure vital signs reviewed and stable Respiratory status: spontaneous breathing, nonlabored ventilation, respiratory function stable and patient connected to nasal cannula oxygen Cardiovascular status: blood pressure returned to baseline and stable Postop Assessment: no apparent nausea or vomiting Anesthetic complications: no   No notable events documented.   Last Vitals:  Vitals:   06/29/22 0815 06/29/22 0817  BP:  134/79  Pulse:    Resp: 16   Temp:  (!) 36.3 C  SpO2: 100%     Last Pain:  Vitals:   06/29/22 0819  TempSrc:   PainSc: 0-No pain                 Stephanie Coup

## 2022-06-29 NOTE — Transfer of Care (Signed)
Immediate Anesthesia Transfer of Care Note  Patient: Jane Morrison  Procedure(s) Performed: COLONOSCOPY WITH PROPOFOL  Patient Location: PACU  Anesthesia Type:General  Level of Consciousness: sedated  Airway & Oxygen Therapy: Patient Spontanous Breathing and Patient connected to nasal cannula oxygen  Post-op Assessment: Report given to RN and Post -op Vital signs reviewed and stable  Post vital signs: Reviewed and stable  Last Vitals:  Vitals Value Taken Time  BP    Temp    Pulse 79 06/29/22 0816  Resp 24 06/29/22 0816  SpO2 100 % 06/29/22 0816  Vitals shown include unvalidated device data.  Last Pain:  Vitals:   06/29/22 0815  TempSrc:   PainSc: 0-No pain         Complications: No notable events documented.

## 2022-06-29 NOTE — H&P (Signed)
Jonathon Bellows, MD [address] [place], [place], [place], [id number], [place], [place], [place], [address] Phone: [phone]  Fax: [phone]  Primary Care Physician:  Bo Merino, FNP   Pre-Procedure History & Physical: HPI:  BOWEN GOYAL is a 61 y.o. female is here for an colonoscopy.   Past Medical History:  Diagnosis Date   Anxiety    Depression    Diabetes mellitus without complication ([place])    Hyperlipidemia    Hypertension     Past Surgical History:  Procedure Laterality Date   TUBAL LIGATION      Prior to Admission medications   Medication Sig Start Date End Date Taking? Authorizing Provider  Accu-Chek Softclix Lancets lancets Use as instructed 06/15/22  Yes Bo Merino, FNP  blood glucose meter kit and supplies KIT Dispense based on patient and insurance preference. Use up to four times daily as directed. (FOR ICD-9 250.00, 250.01). 06/15/22  Yes Bo Merino, FNP  Glucose Blood (BLOOD GLUCOSE TEST STRIPS) STRP Use as directed to monitor FSBS once daily for DM 06/15/22  Yes Bo Merino, FNP  Insulin Pen Needle 32G X 6 MM MISC 1 each by Does not apply route daily. 06/15/22  Yes Bo Merino, FNP  losartan (COZAAR) 25 MG tablet TAKE 1 TABLET (25 MG TOTAL) BY MOUTH DAILY. 06/23/22  Yes Bo Merino, FNP  metFORMIN (GLUCOPHAGE) 500 MG tablet Take 1 tablet (500 mg total) by mouth 2 (two) times daily with a meal. 06/15/22  Yes Bo Merino, FNP  TRESIBA FLEXTOUCH 100 UNIT/ML FlexTouch Pen INJECT 10 UNITS INTO THE SKIN DAILY 06/23/22  Yes Bo Merino, FNP  Glucagon (GVOKE HYPOPEN 1-PACK) 0.5 MG/0.1ML SOAJ Inject 0.5 mg into the skin once as needed for up to 1 dose (for hypoglycemia). 06/15/22   Bo Merino, FNP    Allergies as of 06/16/2022   (No Known Allergies)    Family History  Problem Relation Age of Onset   Hypertension Mother    Diabetes Mother    Hypertension Father    Diabetes Father    Breast cancer Sister 19     Social History   Socioeconomic History   Marital status: Married    Spouse name: Not on file   Number of children: 3   Years of education: Not on file   Highest education level: Not on file  Occupational History   Not on file  Tobacco Use   Smoking status: Every Day    Types: Cigarettes   Smokeless tobacco: Never  Vaping Use   Vaping Use: Never used  Substance and Sexual Activity   Alcohol use: No   Drug use: No   Sexual activity: Yes  Other Topics Concern   Not on file  Social History Narrative   3 biological kids 4 stepchildren   Social Determinants of Health   Financial Resource Strain: Not on file  Food Insecurity: Not on file  Transportation Needs: Not on file  Physical Activity: Not on file  Stress: Not on file  Social Connections: Not on file  Intimate Partner Violence: Not on file    Review of Systems: See HPI, otherwise negative ROS  Physical Exam: BP 123/80   Pulse 76   Temp (!) 96.9 F (36.1 C) (Temporal)   Resp 16   Ht 5' 8.5" (1.74 m)   Wt 56.8 kg   SpO2 100%   BMI 18.77 kg/m  General:  Alert,  pleasant and cooperative in NAD Head:  Normocephalic and atraumatic. Neck:  Supple; no masses or thyromegaly. Lungs:  Clear throughout to auscultation, normal respiratory effort.    Heart:  +S1, +S2, Regular rate and rhythm, No edema. Abdomen:  Soft, nontender and nondistended. Normal bowel sounds, without guarding, and without rebound.   Neurologic:  Alert and  oriented x4;  grossly normal neurologically.  Impression/Plan: MARCILLE BARMAN is here for an colonoscopy to be performed for Screening colonoscopy average risk   Risks, benefits, limitations, and alternatives regarding  colonoscopy have been reviewed with the patient.  Questions have been answered.  All parties agreeable.   Jonathon Bellows, MD  06/29/2022, 7:49 AM

## 2022-06-30 ENCOUNTER — Encounter: Payer: Self-pay | Admitting: Gastroenterology

## 2022-09-19 DIAGNOSIS — W228XXA Striking against or struck by other objects, initial encounter: Secondary | ICD-10-CM | POA: Diagnosis not present

## 2022-09-19 DIAGNOSIS — R69 Illness, unspecified: Secondary | ICD-10-CM | POA: Diagnosis not present

## 2022-09-19 DIAGNOSIS — I10 Essential (primary) hypertension: Secondary | ICD-10-CM | POA: Diagnosis not present

## 2022-09-19 DIAGNOSIS — Y990 Civilian activity done for income or pay: Secondary | ICD-10-CM | POA: Diagnosis not present

## 2022-09-19 DIAGNOSIS — S0083XA Contusion of other part of head, initial encounter: Secondary | ICD-10-CM | POA: Diagnosis not present

## 2022-09-19 DIAGNOSIS — E119 Type 2 diabetes mellitus without complications: Secondary | ICD-10-CM | POA: Diagnosis not present

## 2022-09-19 DIAGNOSIS — Z7982 Long term (current) use of aspirin: Secondary | ICD-10-CM | POA: Diagnosis not present

## 2022-09-19 DIAGNOSIS — R519 Headache, unspecified: Secondary | ICD-10-CM | POA: Diagnosis not present

## 2022-09-19 DIAGNOSIS — Z7984 Long term (current) use of oral hypoglycemic drugs: Secondary | ICD-10-CM | POA: Diagnosis not present

## 2022-09-19 DIAGNOSIS — S0181XA Laceration without foreign body of other part of head, initial encounter: Secondary | ICD-10-CM | POA: Diagnosis not present

## 2022-10-30 ENCOUNTER — Other Ambulatory Visit: Payer: Self-pay | Admitting: Nurse Practitioner

## 2022-10-30 DIAGNOSIS — E1165 Type 2 diabetes mellitus with hyperglycemia: Secondary | ICD-10-CM

## 2022-10-31 NOTE — Telephone Encounter (Signed)
Requested Prescriptions  Pending Prescriptions Disp Refills   BD PEN NEEDLE MICRO U/F 32G X 6 MM MISC [Pharmacy Med Name: BD UF MICRO PEN NEEDLE 6MMX32G] 100 each 0    Sig: USE 1 DAILY AS DIRECTED     Endocrinology: Diabetes - Testing Supplies Passed - 10/30/2022  8:51 AM      Passed - Valid encounter within last 12 months    Recent Outpatient Visits           4 months ago Weight loss   [facility] [place], [place], [place]

## 2022-11-01 ENCOUNTER — Other Ambulatory Visit: Payer: Self-pay | Admitting: *Deleted

## 2022-11-01 DIAGNOSIS — Z87891 Personal history of nicotine dependence: Secondary | ICD-10-CM

## 2022-11-01 DIAGNOSIS — F17210 Nicotine dependence, cigarettes, uncomplicated: Secondary | ICD-10-CM

## 2022-11-01 DIAGNOSIS — Z122 Encounter for screening for malignant neoplasm of respiratory organs: Secondary | ICD-10-CM

## 2022-11-09 ENCOUNTER — Other Ambulatory Visit: Payer: Self-pay | Admitting: Nurse Practitioner

## 2022-11-09 DIAGNOSIS — I10 Essential (primary) hypertension: Secondary | ICD-10-CM

## 2022-11-09 DIAGNOSIS — E1165 Type 2 diabetes mellitus with hyperglycemia: Secondary | ICD-10-CM

## 2022-11-09 NOTE — Telephone Encounter (Signed)
Requested Prescriptions  Pending Prescriptions Disp Refills   losartan (COZAAR) 25 MG tablet [Pharmacy Med Name: LOSARTAN POTASSIUM 25 MG TAB] 90 tablet 0    Sig: TAKE 1 TABLET (25 MG TOTAL) BY MOUTH DAILY.     Cardiovascular:  Angiotensin Receptor Blockers Failed - 11/09/2022  2:30 PM      Failed - Cr in normal range and within 180 days    Creat  Date Value Ref Range Status  06/13/2022 1.27 (H) 0.50 - 1.03 mg/dL Final   Creatinine, Urine  Date Value Ref Range Status  06/13/2022 19 (L) 20 - 275 mg/dL Final         Failed - K in normal range and within 180 days    Potassium  Date Value Ref Range Status  06/13/2022 5.5 (H) 3.5 - 5.3 mmol/L Final  11/05/2012 3.7 3.5 - 5.1 mmol/L Final         Passed - Patient is not pregnant      Passed - Last BP in normal range    BP Readings from Last 1 Encounters:  06/29/22 134/79         Passed - Valid encounter within last 6 months    Recent Outpatient Visits           4 months ago Weight loss   [facility] [place], [place]

## 2022-12-14 ENCOUNTER — Ambulatory Visit
Admission: RE | Admit: 2022-12-14 | Discharge: 2022-12-14 | Disposition: A | Discharge status: Home / Self Care | Payer: 59 | Source: Ambulatory Visit | Attending: Nurse Practitioner | Admitting: Nurse Practitioner

## 2022-12-14 DIAGNOSIS — R634 Abnormal weight loss: Secondary | ICD-10-CM | POA: Diagnosis not present

## 2022-12-14 DIAGNOSIS — Z1231 Encounter for screening mammogram for malignant neoplasm of breast: Secondary | ICD-10-CM | POA: Diagnosis not present

## 2022-12-15 ENCOUNTER — Other Ambulatory Visit: Payer: Self-pay | Admitting: Nurse Practitioner

## 2022-12-15 DIAGNOSIS — R928 Other abnormal and inconclusive findings on diagnostic imaging of breast: Secondary | ICD-10-CM

## 2022-12-16 ENCOUNTER — Encounter: Payer: Self-pay | Admitting: Nurse Practitioner

## 2022-12-16 DIAGNOSIS — R928 Other abnormal and inconclusive findings on diagnostic imaging of breast: Secondary | ICD-10-CM

## 2022-12-16 DIAGNOSIS — N6489 Other specified disorders of breast: Secondary | ICD-10-CM

## 2022-12-20 ENCOUNTER — Ambulatory Visit
Admission: RE | Admit: 2022-12-20 | Discharge: 2022-12-20 | Disposition: A | Discharge status: Home / Self Care | Payer: 59 | Source: Ambulatory Visit | Attending: Nurse Practitioner | Admitting: Nurse Practitioner

## 2022-12-20 ENCOUNTER — Encounter: Payer: Self-pay | Admitting: Acute Care

## 2022-12-20 DIAGNOSIS — N6489 Other specified disorders of breast: Secondary | ICD-10-CM | POA: Diagnosis not present

## 2022-12-20 DIAGNOSIS — R928 Other abnormal and inconclusive findings on diagnostic imaging of breast: Secondary | ICD-10-CM | POA: Diagnosis not present

## 2022-12-21 ENCOUNTER — Encounter: Payer: Self-pay | Admitting: Acute Care

## 2022-12-21 ENCOUNTER — Ambulatory Visit (INDEPENDENT_AMBULATORY_CARE_PROVIDER_SITE_OTHER): Payer: 59 | Admitting: Acute Care

## 2022-12-21 ENCOUNTER — Ambulatory Visit
Admission: RE | Admit: 2022-12-21 | Discharge: 2022-12-21 | Disposition: A | Discharge status: Home / Self Care | Payer: Self-pay | Source: Ambulatory Visit | Attending: Acute Care | Admitting: Acute Care

## 2022-12-21 DIAGNOSIS — F17210 Nicotine dependence, cigarettes, uncomplicated: Secondary | ICD-10-CM

## 2022-12-21 DIAGNOSIS — Z87891 Personal history of nicotine dependence: Secondary | ICD-10-CM

## 2022-12-21 DIAGNOSIS — Z122 Encounter for screening for malignant neoplasm of respiratory organs: Secondary | ICD-10-CM

## 2022-12-21 DIAGNOSIS — R69 Illness, unspecified: Secondary | ICD-10-CM | POA: Diagnosis not present

## 2022-12-21 NOTE — Patient Instructions (Signed)
Thank you for participating in the [facility] Screening Program. It was our pleasure to meet you today. We will call you with the results of your scan within the next few days. Your scan will be assigned a Lung RADS category score by the physicians reading the scans.  This Lung RADS score determines follow up scanning.  See below for description of categories, and follow up screening recommendations. We will be in touch to schedule your follow up screening annually or based on recommendations of our providers. We will fax a copy of your scan results to your Primary Care Physician, or the physician who referred you to the program, to ensure they have the results. Please call the office if you have any questions or concerns regarding your scanning experience or results.  Our office number is [phone]. Please speak with Denise Phelps, RN. , or  Denise Buckner RN, They are  our Lung Cancer Screening RN.'s If They are unavailable when you call, Please leave a message on the voice mail. We will return your call at our earliest convenience.This voice mail is monitored several times a day.  Remember, if your scan is normal, we will scan you annually as long as you continue to meet the criteria for the program. (Age 55-77, Current smoker or smoker who has quit within the last 15 years). If you are a smoker, remember, quitting is the single most powerful action that you can take to decrease your risk of lung cancer and other pulmonary, breathing related problems. We know quitting is hard, and we are here to help.  Please let us know if there is anything we can do to help you meet your goal of quitting. If you are a former smoker, congratulations. We are proud of you! Remain smoke free! Remember you can refer friends or family members through the number above.  We will screen them to make sure they meet criteria for the program. Thank you for helping us take better care of you by  participating in Lung Screening.  You can receive free nicotine replacement therapy ( patches, gum or mints) by calling 1-800-QUIT NOW. Please call so we can get you on the path to becoming  a non-smoker. I know it is hard, but you can do this!  Lung RADS Categories:  Lung RADS 1: no nodules or definitely non-concerning nodules.  Recommendation is for a repeat annual scan in 12 months.  Lung RADS 2:  nodules that are non-concerning in appearance and behavior with a very low likelihood of becoming an active cancer. Recommendation is for a repeat annual scan in 12 months.  Lung RADS 3: nodules that are probably non-concerning , includes nodules with a low likelihood of becoming an active cancer.  Recommendation is for a 6-month repeat screening scan. Often noted after an upper respiratory illness. We will be in touch to make sure you have no questions, and to schedule your 6-month scan.  Lung RADS 4 A: nodules with concerning findings, recommendation is most often for a follow up scan in 3 months or additional testing based on our provider's assessment of the scan. We will be in touch to make sure you have no questions and to schedule the recommended 3 month follow up scan.  Lung RADS 4 B:  indicates findings that are concerning. We will be in touch with you to schedule additional diagnostic testing based on our provider's  assessment of the scan.  Other options for assistance in smoking cessation (   As covered by your insurance benefits)  Hypnosis for smoking cessation  [employer] [phone]  Acupuncture for smoking cessation  [facility] [phone]   

## 2022-12-21 NOTE — Progress Notes (Addendum)
Virtual Visit via Telephone Note  I connected with Jane Morrison on 12/21/22 at 11:00 AM EST by telephone and verified that I am speaking with the correct person using two identifiers.  Location: Patient:  At home Provider: [place], [place], [place], [address]    I discussed the limitations, risks, security and privacy concerns of performing an evaluation and management service by telephone and the availability of in person appointments. I also discussed with the patient that there may be a patient responsible charge related to this service. The patient expressed understanding and agreed to proceed.   Shared Decision Making Visit Lung Cancer Screening Program [phone])   Eligibility: Age 62 y.o. Pack Years Smoking History Calculation  20 pack year smoking history (# packs/per year x # years smoked) Recent History of coughing up blood  no Unexplained weight loss? no ( >Than 15 pounds within the last 6 months ) Prior History Lung / other cancer no (Diagnosis within the last 5 years already requiring surveillance chest CT Scans). Smoking Status Current Smoker Former Smokers: Years since quit: NA  Quit Date: NA  Visit Components: Discussion included one or more decision making aids. yes Discussion included risk/benefits of screening. yes Discussion included potential follow up diagnostic testing for abnormal scans. yes Discussion included meaning and risk of over diagnosis. yes Discussion included meaning and risk of False Positives. yes Discussion included meaning of total radiation exposure. yes  Counseling Included: Importance of adherence to annual lung cancer LDCT screening. yes Impact of comorbidities on ability to participate in the program. yes Ability and willingness to under diagnostic treatment. yes  Smoking Cessation Counseling: Current Smokers:  Discussed importance of smoking cessation. yes Information about tobacco cessation classes and  interventions provided to patient. yes Patient provided with "ticket" for LDCT Scan. yes Symptomatic Patient. no  Counseling NA Diagnosis Code: Tobacco Use Z72.0 Asymptomatic Patient yes  Counseling (Intermediate counseling: > three minutes counseling) A1937 Former Smokers:  Discussed the importance of maintaining cigarette abstinence. yes Diagnosis Code: Personal History of Nicotine Dependence. T02.409 Information about tobacco cessation classes and interventions provided to patient. Yes Patient provided with "ticket" for LDCT Scan. yes Written Order for Lung Cancer Screening with LDCT placed in Epic. Yes (CT Chest Lung Cancer Screening Low Dose W/O CM) [id number] Z12.2-Screening of respiratory organs Z87.891-Personal history of nicotine dependence  I have spent 25 minutes of face to face/ virtual visit   time with  Ms. Goebel discussing the risks and benefits of lung cancer screening. We viewed / discussed a power point together that explained in detail the above noted topics. We paused at intervals to allow for questions to be asked and answered to ensure understanding.We discussed that the single most powerful action that she can take to decrease her risk of developing lung cancer is to quit smoking. We discussed whether or not she is ready to commit to setting a quit date. We discussed options for tools to aid in quitting smoking including nicotine replacement therapy, non-nicotine medications, support groups, Quit Smart classes, and behavior modification. We discussed that often times setting smaller, more achievable goals, such as eliminating 1 cigarette a day for a week and then 2 cigarettes a day for a week can be helpful in slowly decreasing the number of cigarettes smoked. This allows for a sense of accomplishment as well as providing a clinical benefit. I provided  her  with smoking cessation  information  with contact information for community resources, classes, free nicotine  replacement  therapy, and access to mobile apps, text messaging, and on-line smoking cessation help. I have also provided  her  the office contact information in the event she needs to contact me, or the screening staff. We discussed the time and location of the scan, and that either Doroteo Glassman RN, Joella Prince, RN  or I will call / send a letter with the results within 24-72 hours of receiving them. The patient verbalized understanding of all of  the above and had no further questions upon leaving the office. They have my contact information in the event they have any further questions.  I spent 3 minutes counseling on smoking cessation and the health risks of continued tobacco abuse.  I explained to the patient that there has been a high incidence of coronary artery disease noted on these exams. I explained that this is a non-gated exam therefore degree or severity cannot be determined. This patient is not on statin therapy. I have asked the patient to follow-up with their PCP regarding any incidental finding of coronary artery disease and management with diet or medication as their PCP  feels is clinically indicated. The patient verbalized understanding of the above and had no further questions upon completion of the visit.      Magdalen Spatz, NP 12/21/2022

## 2022-12-27 ENCOUNTER — Telehealth: Payer: Self-pay | Admitting: Acute Care

## 2022-12-27 ENCOUNTER — Other Ambulatory Visit: Payer: Self-pay

## 2022-12-27 DIAGNOSIS — Z87891 Personal history of nicotine dependence: Secondary | ICD-10-CM

## 2022-12-27 DIAGNOSIS — R911 Solitary pulmonary nodule: Secondary | ICD-10-CM

## 2022-12-27 NOTE — Telephone Encounter (Signed)
Results/plan faxed to PCP. New order placed for 6 months LCS LDCT for follow up nodule

## 2022-12-27 NOTE — Telephone Encounter (Signed)
I have called the patient with the results of her low dose Ct Chest. I explained that her scan was read as a Lung  RADS 3, nodules that are probably benign findings, short term follow up suggested: includes nodules with a low likelihood of becoming a clinically active cancer. Radiology recommends a 6 month repeat LDCT follow up. She has a 6.5 mm nodule in the RUL, that radiology feel could be infectious or inflammatory. She did get her flu and Covid Vaccines 3 weeks before the scan.  I have also spoken with her about the notation of age advanced cardiac disease. She has a grandmother who had an MI. I told her we will fax results to her PCP . She is not on statin medication, and she may benefit from cardiology consult for eval and follow up. She is interested in quitting smoking. She is going to try nicotine mints ( I gave her the 1-800-QUIT NOW) number for free nicotine replacement  therapy. Denise, 6 month follow up LDCT . Fax  results to PCP. Thanks so much

## 2023-02-01 LAB — HM DIABETES EYE EXAM

## 2023-02-05 ENCOUNTER — Other Ambulatory Visit: Payer: Self-pay | Admitting: Nurse Practitioner

## 2023-02-05 DIAGNOSIS — E1165 Type 2 diabetes mellitus with hyperglycemia: Secondary | ICD-10-CM

## 2023-02-05 DIAGNOSIS — I10 Essential (primary) hypertension: Secondary | ICD-10-CM

## 2023-02-06 MED ORDER — BD PEN NEEDLE MICRO U/F 32G X 6 MM MISC: 0 refills | Status: DC

## 2023-02-06 NOTE — Telephone Encounter (Signed)
Courtesy refill. Patient will need an office visit for further refills. Requested Prescriptions  Pending Prescriptions Disp Refills   losartan (COZAAR) 25 MG tablet [Pharmacy Med Name: LOSARTAN POTASSIUM 25 MG TAB] 30 tablet 0    Sig: TAKE 1 TABLET (25 MG TOTAL) BY MOUTH DAILY.     Cardiovascular:  Angiotensin Receptor Blockers Failed - 02/05/2023  8:43 AM      Failed - Cr in normal range and within 180 days    Creat  Date Value Ref Range Status  06/13/2022 1.27 (H) 0.50 - 1.03 mg/dL Final   Creatinine, Urine  Date Value Ref Range Status  06/13/2022 19 (L) 20 - 275 mg/dL Final         Failed - K in normal range and within 180 days    Potassium  Date Value Ref Range Status  06/13/2022 5.5 (H) 3.5 - 5.3 mmol/L Final  11/05/2012 3.7 3.5 - 5.1 mmol/L Final         Failed - Valid encounter within last 6 months    Recent Outpatient Visits           7 months ago Weight loss   [place], [place]              Passed - Patient is not pregnant      Passed - Last BP in normal range    BP Readings from Last 1 Encounters:  06/29/22 134/79

## 2023-02-06 NOTE — Telephone Encounter (Signed)
Left message on machine to call back and schedule appt.

## 2023-02-20 ENCOUNTER — Emergency Department: Payer: 59

## 2023-02-20 ENCOUNTER — Encounter: Payer: Self-pay | Admitting: Emergency Medicine

## 2023-02-20 ENCOUNTER — Emergency Department
Admission: EM | Admit: 2023-02-20 | Discharge: 2023-02-20 | Disposition: A | Discharge status: Home / Self Care | Payer: 59 | Attending: Student in an Organized Health Care Education/Training Program | Admitting: Student in an Organized Health Care Education/Training Program

## 2023-02-20 ENCOUNTER — Other Ambulatory Visit: Payer: Self-pay

## 2023-02-20 DIAGNOSIS — R051 Acute cough: Secondary | ICD-10-CM | POA: Insufficient documentation

## 2023-02-20 DIAGNOSIS — E119 Type 2 diabetes mellitus without complications: Secondary | ICD-10-CM | POA: Diagnosis not present

## 2023-02-20 DIAGNOSIS — Z1152 Encounter for screening for COVID-19: Secondary | ICD-10-CM | POA: Diagnosis not present

## 2023-02-20 DIAGNOSIS — I10 Essential (primary) hypertension: Secondary | ICD-10-CM | POA: Diagnosis not present

## 2023-02-20 DIAGNOSIS — R000 Tachycardia, unspecified: Secondary | ICD-10-CM | POA: Insufficient documentation

## 2023-02-20 DIAGNOSIS — R059 Cough, unspecified: Secondary | ICD-10-CM | POA: Diagnosis not present

## 2023-02-20 LAB — RESP PANEL BY RT-PCR (RSV, FLU A&B, COVID)  RVPGX2
Influenza A by PCR: NEGATIVE
Influenza B by PCR: NEGATIVE
Resp Syncytial Virus by PCR: NEGATIVE
SARS Coronavirus 2 by RT PCR: NEGATIVE

## 2023-02-20 MED ORDER — AZITHROMYCIN 250 MG PO TABS: ORAL_TABLET | ORAL | 0 refills | Status: AC

## 2023-02-20 MED ORDER — AMOXICILLIN-POT CLAVULANATE 875-125 MG PO TABS: 1.0000 | ORAL_TABLET | Freq: Two times a day (BID) | ORAL | 0 refills | Status: AC

## 2023-02-20 NOTE — ED Triage Notes (Signed)
Pt here with a cough and congestion x1 week. Pt denies pain. Pt denies fever or ill symptoms but states she cannot get the phlegm out. Pt states she has been swabbed with negative results.

## 2023-02-20 NOTE — ED Provider Notes (Signed)
[facility] Provider Note  Patient Contact: 4:12 PM (approximate)   History   Cough   HPI  Jane Morrison is a 62 y.o. female with a history of depression, hyperlipidemia, hypertension and diabetes, presents to the emergency department with productive cough or purulent sputum production for the next week.  Patient states that her cough is worse at night and is keeping her up.  She states that occasionally her chest feels tight.  No shortness of breath.  No lower extremity swelling.  She states that her cough started with viral URI-like symptoms.  No nausea, vomiting or abdominal pain.  No diarrhea.  No alleviating measures have been attempted      Physical Exam   Triage Vital Signs: ED Triage Vitals  Enc Vitals Group     BP 02/20/23 1313 (!) 190/112     Pulse Rate 02/20/23 1312 (!) 104     Resp 02/20/23 1312 16     Temp 02/20/23 1312 98 F (36.7 C)     Temp Source 02/20/23 1312 Oral     SpO2 02/20/23 1312 99 %     Weight 02/20/23 1312 125 lb 3.5 oz (56.8 kg)     Height 02/20/23 1312 5' 8.5" (1.74 m)     Head Circumference --      Peak Flow --      Pain Score 02/20/23 1312 0     Pain Loc --      Pain Edu? --      Excl. in [place]? --     Most recent vital signs: Vitals:   02/20/23 1312 02/20/23 1313  BP:  (!) 190/112  Pulse: (!) 104   Resp: 16   Temp: 98 F (36.7 C)   SpO2: 99%      General: Alert and in no acute distress. Eyes:  PERRL. EOMI. Head: No acute traumatic findings ENT:      Nose: No congestion/rhinnorhea.      Mouth/Throat: Mucous membranes are moist. Neck: No stridor. No cervical spine tenderness to palpation. Cardiovascular:  Good peripheral perfusion Respiratory: Normal respiratory effort without tachypnea or retractions. Lungs CTAB. Good air entry to the bases with no decreased or absent breath sounds. Gastrointestinal: Bowel sounds 4 quadrants. Soft and nontender to palpation. No guarding or rigidity. No palpable  masses. No distention. No CVA tenderness. Musculoskeletal: Full range of motion to all extremities.  Neurologic:  No gross focal neurologic deficits are appreciated.  Skin:   No rash noted Other:   ED Results / Procedures / Treatments   Labs (all labs ordered are listed, but only abnormal results are displayed) Labs Reviewed  RESP PANEL BY RT-PCR (RSV, FLU A&B, COVID)  [id number]       RADIOLOGY  I personally viewed and evaluated these images as part of my medical decision making, as well as reviewing the written report by the radiologist.  ED Provider Interpretation: Increased interstitial markings on chest x-ray, pulmonary edema versus atypical infection.   PROCEDURES:  Critical Care performed: No  Procedures   MEDICATIONS ORDERED IN ED: Medications - No data to display   IMPRESSION / MDM / [place] / ED COURSE  I reviewed the triage vital signs and the nursing notes.                              Assessment and plan Cough 62 year old female presents to the emergency department with productive  cough or purulent sputum production after viral URI-like symptoms.  Patient was hypertensive and tachycardic at triage but vital signs were otherwise reassuring.  On exam, patient alert, active and nontoxic-appearing with no increased work of breathing.  No lower extremity swelling.  No other adventitious lung sounds auscultated . Given history of diabetes, will start patient on both Augmentin and azithromycin to cover her for postviral pneumonia.  Patient feels comfortable with this plan and has Easy Access to the emergency department should her symptoms change or worsen.      FINAL CLINICAL IMPRESSION(S) / ED DIAGNOSES   Final diagnoses:  Acute cough     Rx / DC Orders   ED Discharge Orders          Ordered    amoxicillin-clavulanate (AUGMENTIN) 875-125 MG tablet  2 times daily        02/20/23 1603    azithromycin (ZITHROMAX Z-PAK) 250 MG tablet         02/20/23 1603             Note:  This document was prepared using Dragon voice recognition software and may include unintentional dictation errors.   Vallarie Mare [place], PA-C 02/20/23 1615    Naaman Plummer, MD 02/21/23 0002

## 2023-02-20 NOTE — Discharge Instructions (Addendum)
Take azithromycin and Augmentin as directed.

## 2023-03-01 ENCOUNTER — Other Ambulatory Visit: Payer: Self-pay | Admitting: Nurse Practitioner

## 2023-03-01 DIAGNOSIS — I10 Essential (primary) hypertension: Secondary | ICD-10-CM

## 2023-03-01 DIAGNOSIS — E1165 Type 2 diabetes mellitus with hyperglycemia: Secondary | ICD-10-CM

## 2023-03-02 NOTE — Telephone Encounter (Signed)
Courtesy RF has already been given. Requested Prescriptions  Pending Prescriptions Disp Refills   losartan (COZAAR) 25 MG tablet [Pharmacy Med Name: LOSARTAN POTASSIUM 25 MG TAB] 90 tablet 1    Sig: TAKE 1 TABLET (25 MG TOTAL) BY MOUTH DAILY.     Cardiovascular:  Angiotensin Receptor Blockers Failed - 03/01/2023 12:34 PM      Failed - Cr in normal range and within 180 days    Creat  Date Value Ref Range Status  06/13/2022 1.27 (H) 0.50 - 1.03 mg/dL Final   Creatinine, Urine  Date Value Ref Range Status  06/13/2022 19 (L) 20 - 275 mg/dL Final         Failed - K in normal range and within 180 days    Potassium  Date Value Ref Range Status  06/13/2022 5.5 (H) 3.5 - 5.3 mmol/L Final  11/05/2012 3.7 3.5 - 5.1 mmol/L Final         Failed - Last BP in normal range    BP Readings from Last 1 Encounters:  02/20/23 (!) 180/100         Failed - Valid encounter within last 6 months    Recent Outpatient Visits           8 months ago Weight loss   [place], [place]              Passed - Patient is not pregnant

## 2023-03-05 ENCOUNTER — Other Ambulatory Visit: Payer: Self-pay | Admitting: Nurse Practitioner

## 2023-03-05 DIAGNOSIS — E1165 Type 2 diabetes mellitus with hyperglycemia: Secondary | ICD-10-CM

## 2023-03-05 DIAGNOSIS — I10 Essential (primary) hypertension: Secondary | ICD-10-CM

## 2023-03-06 NOTE — Telephone Encounter (Signed)
Requested medications are due for refill today.  yes  Requested medications are on the active medications list.  yes  Last refill. 02/06/2023 #30 0 rf  Future visit scheduled.   no  Notes to clinic.  Pt already given a courtesy refill.    Requested Prescriptions  Pending Prescriptions Disp Refills   losartan (COZAAR) 25 MG tablet [Pharmacy Med Name: LOSARTAN POTASSIUM 25 MG TAB] 30 tablet 0    Sig: TAKE 1 TABLET (25 MG TOTAL) BY MOUTH DAILY.     Cardiovascular:  Angiotensin Receptor Blockers Failed - 03/05/2023  8:54 AM      Failed - Cr in normal range and within 180 days    Creat  Date Value Ref Range Status  06/13/2022 1.27 (H) 0.50 - 1.03 mg/dL Final   Creatinine, Urine  Date Value Ref Range Status  06/13/2022 19 (L) 20 - 275 mg/dL Final         Failed - K in normal range and within 180 days    Potassium  Date Value Ref Range Status  06/13/2022 5.5 (H) 3.5 - 5.3 mmol/L Final  11/05/2012 3.7 3.5 - 5.1 mmol/L Final         Failed - Last BP in normal range    BP Readings from Last 1 Encounters:  02/20/23 (!) 180/100         Failed - Valid encounter within last 6 months    Recent Outpatient Visits           8 months ago Weight loss   [place], [place]              Passed - Patient is not pregnant

## 2023-03-07 NOTE — Telephone Encounter (Signed)
Pt is scheduled °

## 2023-03-07 NOTE — Telephone Encounter (Signed)
Patient need  appointment to follow up for blood pressure

## 2023-03-22 ENCOUNTER — Other Ambulatory Visit: Payer: Self-pay

## 2023-03-22 ENCOUNTER — Ambulatory Visit (INDEPENDENT_AMBULATORY_CARE_PROVIDER_SITE_OTHER): Payer: Self-pay | Admitting: Nurse Practitioner

## 2023-03-22 ENCOUNTER — Encounter: Payer: Self-pay | Admitting: Nurse Practitioner

## 2023-03-22 VITALS — BP 132/84 | HR 98 | Temp 98.2°F | Resp 16 | Ht 66.5 in | Wt 146.7 lb

## 2023-03-22 DIAGNOSIS — Z794 Long term (current) use of insulin: Secondary | ICD-10-CM | POA: Diagnosis not present

## 2023-03-22 DIAGNOSIS — F17200 Nicotine dependence, unspecified, uncomplicated: Secondary | ICD-10-CM

## 2023-03-22 DIAGNOSIS — Z716 Tobacco abuse counseling: Secondary | ICD-10-CM

## 2023-03-22 DIAGNOSIS — E1165 Type 2 diabetes mellitus with hyperglycemia: Secondary | ICD-10-CM | POA: Insufficient documentation

## 2023-03-22 DIAGNOSIS — I10 Essential (primary) hypertension: Secondary | ICD-10-CM

## 2023-03-22 LAB — CBC WITH DIFFERENTIAL/PLATELET
Eosinophils Relative: 1.1 %
MCV: 90.4 fL (ref 80.0–100.0)
Neutro Abs: 3703 cells/uL (ref 1500–7800)
Neutrophils Relative %: 52.9 %
Platelets: 372 10*3/uL (ref 140–400)

## 2023-03-22 MED ORDER — VARENICLINE TARTRATE (STARTER) 0.5 MG X 11 & 1 MG X 42 PO TBPK: ORAL_TABLET | ORAL | 0 refills | Status: DC

## 2023-03-22 MED ORDER — LOSARTAN POTASSIUM 25 MG PO TABS
25.0000 mg | ORAL_TABLET | Freq: Every day | ORAL | 0 refills | Status: DC
Start: 2023-03-22 — End: 2023-04-03

## 2023-03-22 MED ORDER — METFORMIN HCL 500 MG PO TABS: 500.0000 mg | ORAL_TABLET | Freq: Two times a day (BID) | ORAL | 0 refills | Status: DC

## 2023-03-22 MED ORDER — TRESIBA FLEXTOUCH 100 UNIT/ML ~~LOC~~ SOPN: 10.0000 [IU] | PEN_INJECTOR | Freq: Every day | SUBCUTANEOUS | 3 refills | Status: DC

## 2023-03-22 NOTE — Progress Notes (Signed)
BP 132/84   Pulse 98   Temp 98.2 F (36.8 C) (Oral)   Resp 16   Ht 5' 6.5" (1.689 m)   Wt 146 lb 11.2 oz (66.5 kg)   SpO2 96%   BMI 23.32 kg/m    Subjective:    Patient ID: Jane Morrison, female    DOB: 1961/10/22, 62 y.o.   MRN: [id number]  HPI: Jane Morrison is a 62 y.o. female  Chief Complaint  Patient presents with   Diabetes   Hypertension    Follow up    Weight loss:  Last visit she reported that over the last six months she has lost about 50 lbs.  She  says she was at 160-170 lbs and is now 125 lbs.  She denies any nausea, vomiting and diarrhea. Discussed getting labs, screening tests and increasing caloric intake. Her A1C was >14. Today her weight is 146 lbs.   Diabetes:  last A1C was >14. She had been out of her medication for a while.  She was prescribed metformin 500 mg BID and tresiba 10 units daily.  Patient has not been seen since 06/2022. Patient reports her blood sugar has been running 78-110.  Will get labs.  She denies any polyuria, polydipsia or polyphagia.  Which is an improvement from her last visit.  Hypertension: her blood pressure today is 132/84. she currently takes losartan 25 mg daily.  She is doing well.  She denies any chest pain, shortness of breath, headaches or blurred vision.  Smoking cessation: she would like to quit smoking.  She currently smokes about a pack every two days.  She has smoked since she was 18.  She has tried patches in the past but did not work for her. Will trial chantix.   Relevant past medical, surgical, family and social history reviewed and updated as indicated. Interim medical history since our last visit reviewed. Allergies and medications reviewed and updated.  Review of Systems  Constitutional: Negative for fever, positive for weight loss Respiratory: Negative for cough and shortness of breath.   Cardiovascular: Negative for chest pain or palpitations.  Gastrointestinal: Negative for abdominal pain, no bowel  changes.  Musculoskeletal: Negative for gait problem or joint swelling.  Skin: Negative for rash.  Neurological: Negative for dizziness or headache.  No other specific complaints in a complete review of systems (except as listed in HPI above).      Objective:    BP 132/84   Pulse 98   Temp 98.2 F (36.8 C) (Oral)   Resp 16   Ht 5' 6.5" (1.689 m)   Wt 146 lb 11.2 oz (66.5 kg)   SpO2 96%   BMI 23.32 kg/m   Wt Readings from Last 3 Encounters:  03/22/23 146 lb 11.2 oz (66.5 kg)  02/20/23 125 lb 3.5 oz (56.8 kg)  06/29/22 125 lb 3.9 oz (56.8 kg)    Physical Exam  Constitutional: Patient appears well-developed and well-nourished. No distress.  HEENT: head atraumatic, normocephalic, pupils equal and reactive to light, neck supple Cardiovascular: Normal rate, regular rhythm and normal heart sounds.  No murmur heard. No BLE edema. Pulmonary/Chest: Effort normal and breath sounds normal. No respiratory distress. Abdominal: Soft.  There is no tenderness. Psychiatric: Patient has a normal mood and affect. behavior is normal. Judgment and thought content normal.    Assessment & Plan:   Problem List Items Addressed This Visit       Cardiovascular and Mediastinum   Essential hypertension  Continue losartan 25 mg daily.      Relevant Medications   losartan (COZAAR) 25 MG tablet   Other Relevant Orders   CBC with Differential/Platelet   COMPLETE METABOLIC PANEL WITH GFR     Endocrine   Type 2 diabetes mellitus with hyperglycemia, with long-term current use of insulin    Continue metformin 500 mg 2 times a day and Tresiba 10 units daily..      Relevant Medications   insulin degludec (TRESIBA FLEXTOUCH) 100 UNIT/ML FlexTouch Pen   metFORMIN (GLUCOPHAGE) 500 MG tablet   losartan (COZAAR) 25 MG tablet   Other Relevant Orders   Microalbumin / creatinine urine ratio   COMPLETE METABOLIC PANEL WITH GFR   Hemoglobin A1c     Other   Tobacco dependence - Primary    Start  Chantix, starter pack sent to       Relevant Medications   Varenicline Tartrate, Starter, (CHANTIX STARTING MONTH PAK) 0.5 MG X 11 & 1 MG X 42 TBPK   Other Visit Diagnoses     Encounter for smoking cessation counseling       Start Chantix, starter pack sent to   Relevant Medications   Varenicline Tartrate, Starter, (CHANTIX STARTING MONTH PAK) 0.5 MG X 11 & 1 MG X 42 TBPK        Follow up plan: Return in about 4 months (around 07/22/2023) for follow up.

## 2023-03-22 NOTE — Assessment & Plan Note (Signed)
Continue losartan 25 mg daily

## 2023-03-22 NOTE — Assessment & Plan Note (Signed)
Start Chantix, starter pack sent to

## 2023-03-22 NOTE — Assessment & Plan Note (Signed)
Continue metformin 500 mg 2 times a day and Tresiba 10 units daily.Jane Morrison

## 2023-03-23 LAB — CBC WITH DIFFERENTIAL/PLATELET
Absolute Monocytes: 567 cells/uL (ref 200–950)
Basophils Absolute: 42 cells/uL (ref 0–200)
Basophils Relative: 0.6 %
Eosinophils Absolute: 77 cells/uL (ref 15–500)
HCT: 42.3 % (ref 35.0–45.0)
Hemoglobin: 14.2 g/dL (ref 11.7–15.5)
Lymphs Abs: 2611 cells/uL (ref 850–3900)
MCH: 30.3 pg (ref 27.0–33.0)
MCHC: 33.6 g/dL (ref 32.0–36.0)
MPV: 10.1 fL (ref 7.5–12.5)
Monocytes Relative: 8.1 %
RBC: 4.68 10*6/uL (ref 3.80–5.10)
RDW: 12.6 % (ref 11.0–15.0)
Total Lymphocyte: 37.3 %
WBC: 7 10*3/uL (ref 3.8–10.8)

## 2023-03-23 LAB — HEMOGLOBIN A1C
Hgb A1c MFr Bld: 10.4 % of total Hgb — ABNORMAL HIGH (ref <5.7)
Mean Plasma Glucose: 252 mg/dL
eAG (mmol/L): 13.9 mmol/L

## 2023-03-23 LAB — COMPLETE METABOLIC PANEL WITH GFR
AG Ratio: 1.2 (calc) (ref 1.0–2.5)
ALT: 42 U/L — ABNORMAL HIGH (ref 6–29)
AST: 26 U/L (ref 10–35)
Albumin: 3.9 g/dL (ref 3.6–5.1)
Alkaline phosphatase (APISO): 113 U/L (ref 37–153)
BUN: 12 mg/dL (ref 7–25)
CO2: 28 mmol/L (ref 20–32)
Calcium: 9.6 mg/dL (ref 8.6–10.4)
Chloride: 104 mmol/L (ref 98–110)
Creat: 1.02 mg/dL (ref 0.50–1.03)
Globulin: 3.3 g/dL (calc) (ref 1.9–3.7)
Glucose, Bld: 190 mg/dL — ABNORMAL HIGH (ref 65–99)
Potassium: 5.3 mmol/L (ref 3.5–5.3)
Sodium: 139 mmol/L (ref 135–146)
Total Bilirubin: 0.5 mg/dL (ref 0.2–1.2)
Total Protein: 7.2 g/dL (ref 6.1–8.1)
eGFR: 63 mL/min/{1.73_m2} (ref >=60)

## 2023-03-23 LAB — MICROALBUMIN / CREATININE URINE RATIO
Creatinine, Urine: 105 mg/dL (ref 20–275)
Microalb Creat Ratio: 21 mg/g creat (ref <30)
Microalb, Ur: 2.2 mg/dL

## 2023-03-30 ENCOUNTER — Emergency Department: Payer: 59

## 2023-03-30 ENCOUNTER — Other Ambulatory Visit: Payer: Self-pay

## 2023-03-30 ENCOUNTER — Encounter: Payer: Self-pay | Admitting: Emergency Medicine

## 2023-03-30 ENCOUNTER — Inpatient Hospital Stay
Admission: EM | Admit: 2023-03-30 | Discharge: 2023-04-03 | DRG: 286 | Disposition: A | Discharge status: Home / Self Care | Payer: 59 | Attending: Internal Medicine | Admitting: Internal Medicine

## 2023-03-30 ENCOUNTER — Inpatient Hospital Stay
Admit: 2023-03-30 | Discharge: 2023-03-30 | Disposition: A | Discharge status: Home / Self Care | Payer: 59 | Attending: Family Medicine | Admitting: Family Medicine

## 2023-03-30 DIAGNOSIS — Z7982 Long term (current) use of aspirin: Secondary | ICD-10-CM | POA: Diagnosis not present

## 2023-03-30 DIAGNOSIS — F17210 Nicotine dependence, cigarettes, uncomplicated: Secondary | ICD-10-CM | POA: Diagnosis present

## 2023-03-30 DIAGNOSIS — I5033 Acute on chronic diastolic (congestive) heart failure: Secondary | ICD-10-CM | POA: Diagnosis not present

## 2023-03-30 DIAGNOSIS — Z794 Long term (current) use of insulin: Secondary | ICD-10-CM | POA: Diagnosis not present

## 2023-03-30 DIAGNOSIS — I493 Ventricular premature depolarization: Secondary | ICD-10-CM | POA: Diagnosis not present

## 2023-03-30 DIAGNOSIS — Z1152 Encounter for screening for COVID-19: Secondary | ICD-10-CM | POA: Diagnosis not present

## 2023-03-30 DIAGNOSIS — R0603 Acute respiratory distress: Secondary | ICD-10-CM

## 2023-03-30 DIAGNOSIS — I160 Hypertensive urgency: Secondary | ICD-10-CM | POA: Diagnosis present

## 2023-03-30 DIAGNOSIS — R0602 Shortness of breath: Secondary | ICD-10-CM | POA: Diagnosis not present

## 2023-03-30 DIAGNOSIS — J9600 Acute respiratory failure, unspecified whether with hypoxia or hypercapnia: Secondary | ICD-10-CM | POA: Diagnosis not present

## 2023-03-30 DIAGNOSIS — Z833 Family history of diabetes mellitus: Secondary | ICD-10-CM | POA: Diagnosis not present

## 2023-03-30 DIAGNOSIS — D75839 Thrombocytosis, unspecified: Secondary | ICD-10-CM | POA: Diagnosis present

## 2023-03-30 DIAGNOSIS — J9601 Acute respiratory failure with hypoxia: Secondary | ICD-10-CM | POA: Diagnosis not present

## 2023-03-30 DIAGNOSIS — I509 Heart failure, unspecified: Secondary | ICD-10-CM

## 2023-03-30 DIAGNOSIS — I2489 Other forms of acute ischemic heart disease: Secondary | ICD-10-CM | POA: Diagnosis present

## 2023-03-30 DIAGNOSIS — Z7984 Long term (current) use of oral hypoglycemic drugs: Secondary | ICD-10-CM | POA: Diagnosis not present

## 2023-03-30 DIAGNOSIS — Z8249 Family history of ischemic heart disease and other diseases of the circulatory system: Secondary | ICD-10-CM

## 2023-03-30 DIAGNOSIS — I5022 Chronic systolic (congestive) heart failure: Secondary | ICD-10-CM | POA: Diagnosis present

## 2023-03-30 DIAGNOSIS — E1165 Type 2 diabetes mellitus with hyperglycemia: Secondary | ICD-10-CM

## 2023-03-30 DIAGNOSIS — E119 Type 2 diabetes mellitus without complications: Secondary | ICD-10-CM | POA: Diagnosis not present

## 2023-03-30 DIAGNOSIS — Z803 Family history of malignant neoplasm of breast: Secondary | ICD-10-CM

## 2023-03-30 DIAGNOSIS — F32A Depression, unspecified: Secondary | ICD-10-CM | POA: Diagnosis present

## 2023-03-30 DIAGNOSIS — Z79899 Other long term (current) drug therapy: Secondary | ICD-10-CM | POA: Diagnosis not present

## 2023-03-30 DIAGNOSIS — E785 Hyperlipidemia, unspecified: Secondary | ICD-10-CM | POA: Diagnosis not present

## 2023-03-30 DIAGNOSIS — R7989 Other specified abnormal findings of blood chemistry: Secondary | ICD-10-CM

## 2023-03-30 DIAGNOSIS — I161 Hypertensive emergency: Secondary | ICD-10-CM | POA: Diagnosis present

## 2023-03-30 DIAGNOSIS — I5021 Acute systolic (congestive) heart failure: Secondary | ICD-10-CM | POA: Diagnosis not present

## 2023-03-30 DIAGNOSIS — I081 Rheumatic disorders of both mitral and tricuspid valves: Secondary | ICD-10-CM | POA: Diagnosis present

## 2023-03-30 DIAGNOSIS — F419 Anxiety disorder, unspecified: Secondary | ICD-10-CM | POA: Diagnosis present

## 2023-03-30 DIAGNOSIS — F17200 Nicotine dependence, unspecified, uncomplicated: Secondary | ICD-10-CM | POA: Diagnosis present

## 2023-03-30 DIAGNOSIS — I2510 Atherosclerotic heart disease of native coronary artery without angina pectoris: Secondary | ICD-10-CM | POA: Diagnosis present

## 2023-03-30 DIAGNOSIS — R0902 Hypoxemia: Secondary | ICD-10-CM

## 2023-03-30 DIAGNOSIS — I5031 Acute diastolic (congestive) heart failure: Secondary | ICD-10-CM | POA: Diagnosis not present

## 2023-03-30 DIAGNOSIS — I110 Hypertensive heart disease with heart failure: Secondary | ICD-10-CM | POA: Diagnosis not present

## 2023-03-30 LAB — ECHOCARDIOGRAM COMPLETE
AR max vel: 2.18 cm2
AV Area VTI: 2.46 cm2
AV Area mean vel: 2.33 cm2
AV Mean grad: 2 mmHg
AV Peak grad: 4.8 mmHg
Ao pk vel: 1.09 m/s
Area-P 1/2: 2.92 cm2
Calc EF: 21.7 %
Height: 66.5 in
MV VTI: 1.62 cm2
S' Lateral: 5.3 cm
Single Plane A2C EF: 34.1 %
Single Plane A4C EF: 12.8 %
Weight: 2370.39 oz

## 2023-03-30 LAB — CBC WITH DIFFERENTIAL/PLATELET
Abs Immature Granulocytes: 0.03 10*3/uL (ref 0.00–0.07)
Basophils Absolute: 0 10*3/uL (ref 0.0–0.1)
Basophils Relative: 0 %
Eosinophils Absolute: 0.1 10*3/uL (ref 0.0–0.5)
Eosinophils Relative: 1 %
HCT: 42.6 % (ref 36.0–46.0)
Hemoglobin: 13.9 g/dL (ref 12.0–15.0)
Immature Granulocytes: 0 %
Lymphocytes Relative: 52 %
Lymphs Abs: 5.4 10*3/uL — ABNORMAL HIGH (ref 0.7–4.0)
MCH: 30 pg (ref 26.0–34.0)
MCHC: 32.6 g/dL (ref 30.0–36.0)
MCV: 91.8 fL (ref 80.0–100.0)
Monocytes Absolute: 0.6 10*3/uL (ref 0.1–1.0)
Monocytes Relative: 6 %
Neutro Abs: 4.2 10*3/uL (ref 1.7–7.7)
Neutrophils Relative %: 41 %
Platelets: 403 10*3/uL — ABNORMAL HIGH (ref 150–400)
RBC: 4.64 MIL/uL (ref 3.87–5.11)
RDW: 13.2 % (ref 11.5–15.5)
WBC: 10.4 10*3/uL (ref 4.0–10.5)
nRBC: 0 % (ref 0.0–0.2)

## 2023-03-30 LAB — BLOOD GAS, ARTERIAL
Acid-Base Excess: 1.3 mmol/L (ref 0.0–2.0)
Bicarbonate: 27.2 mmol/L (ref 20.0–28.0)
Delivery systems: POSITIVE
Expiratory PAP: 5 cmH2O
FIO2: 40 %
Inspiratory PAP: 10 cmH2O
O2 Saturation: 98.5 %
Patient temperature: 37
RATE: 12 resp/min
pCO2 arterial: 47 mmHg (ref 32–48)
pH, Arterial: 7.37 (ref 7.35–7.45)
pO2, Arterial: 86 mmHg (ref 83–108)

## 2023-03-30 LAB — CBC
HCT: 40.2 % (ref 36.0–46.0)
Hemoglobin: 13.4 g/dL (ref 12.0–15.0)
MCH: 30 pg (ref 26.0–34.0)
MCHC: 33.3 g/dL (ref 30.0–36.0)
MCV: 89.9 fL (ref 80.0–100.0)
Platelets: 339 10*3/uL (ref 150–400)
RBC: 4.47 MIL/uL (ref 3.87–5.11)
RDW: 12.9 % (ref 11.5–15.5)
WBC: 11 10*3/uL — ABNORMAL HIGH (ref 4.0–10.5)
nRBC: 0 % (ref 0.0–0.2)

## 2023-03-30 LAB — BASIC METABOLIC PANEL
Anion gap: 8 (ref 5–15)
BUN: 13 mg/dL (ref 6–20)
CO2: 29 mmol/L (ref 22–32)
Calcium: 8.4 mg/dL — ABNORMAL LOW (ref 8.9–10.3)
Chloride: 102 mmol/L (ref 98–111)
Creatinine, Ser: 0.93 mg/dL (ref 0.44–1.00)
GFR, Estimated: >60 mL/min (ref >60)
Glucose, Bld: 260 mg/dL — ABNORMAL HIGH (ref 70–99)
Potassium: 4.1 mmol/L (ref 3.5–5.1)
Sodium: 139 mmol/L (ref 135–145)

## 2023-03-30 LAB — RESP PANEL BY RT-PCR (RSV, FLU A&B, COVID)  RVPGX2
Influenza A by PCR: NEGATIVE
Influenza B by PCR: NEGATIVE
Resp Syncytial Virus by PCR: NEGATIVE
SARS Coronavirus 2 by RT PCR: NEGATIVE

## 2023-03-30 LAB — COMPREHENSIVE METABOLIC PANEL
ALT: 131 U/L — ABNORMAL HIGH (ref 0–44)
AST: 221 U/L — ABNORMAL HIGH (ref 15–41)
Albumin: 3.4 g/dL — ABNORMAL LOW (ref 3.5–5.0)
Alkaline Phosphatase: 173 U/L — ABNORMAL HIGH (ref 38–126)
Anion gap: 10 (ref 5–15)
BUN: 14 mg/dL (ref 6–20)
CO2: 24 mmol/L (ref 22–32)
Calcium: 8.4 mg/dL — ABNORMAL LOW (ref 8.9–10.3)
Chloride: 103 mmol/L (ref 98–111)
Creatinine, Ser: 1.02 mg/dL — ABNORMAL HIGH (ref 0.44–1.00)
GFR, Estimated: >60 mL/min (ref >60)
Glucose, Bld: 310 mg/dL — ABNORMAL HIGH (ref 70–99)
Potassium: 3.5 mmol/L (ref 3.5–5.1)
Sodium: 137 mmol/L (ref 135–145)
Total Bilirubin: 0.5 mg/dL (ref 0.3–1.2)
Total Protein: 7.3 g/dL (ref 6.5–8.1)

## 2023-03-30 LAB — MRSA NEXT GEN BY PCR, NASAL: MRSA by PCR Next Gen: NOT DETECTED

## 2023-03-30 LAB — GLUCOSE, CAPILLARY
Glucose-Capillary: 252 mg/dL — ABNORMAL HIGH (ref 70–99)
Glucose-Capillary: 194 mg/dL — ABNORMAL HIGH (ref 70–99)
Glucose-Capillary: 276 mg/dL — ABNORMAL HIGH (ref 70–99)
Glucose-Capillary: 114 mg/dL — ABNORMAL HIGH (ref 70–99)
Glucose-Capillary: 174 mg/dL — ABNORMAL HIGH (ref 70–99)

## 2023-03-30 LAB — TROPONIN I (HIGH SENSITIVITY)
Troponin I (High Sensitivity): 41 ng/L — ABNORMAL HIGH (ref <18)
Troponin I (High Sensitivity): 63 ng/L — ABNORMAL HIGH (ref <18)
Troponin I (High Sensitivity): 63 ng/L — ABNORMAL HIGH (ref <18)

## 2023-03-30 LAB — BRAIN NATRIURETIC PEPTIDE: B Natriuretic Peptide: 471 pg/mL — ABNORMAL HIGH (ref 0.0–100.0)

## 2023-03-30 MED ORDER — INSULIN ASPART 100 UNIT/ML IJ SOLN
0.0000 [IU] | Freq: Every day | INTRAMUSCULAR | Status: DC
  Administered 2023-04-01: 3 [IU] via SUBCUTANEOUS
  Administered 2023-04-02: 2 [IU] via SUBCUTANEOUS
  Filled 2023-03-30 (×2): qty 1

## 2023-03-30 MED ORDER — NITROGLYCERIN IN D5W 200-5 MCG/ML-% IV SOLN
0.0000 ug/min | INTRAVENOUS | Status: DC
  Administered 2023-03-30: 5 ug/min via INTRAVENOUS
  Filled 2023-03-30: qty 250

## 2023-03-30 MED ORDER — ACETAMINOPHEN 325 MG PO TABS: 650.0000 mg | ORAL_TABLET | Freq: Four times a day (QID) | ORAL | Status: DC | PRN

## 2023-03-30 MED ORDER — MAGNESIUM HYDROXIDE 400 MG/5ML PO SUSP: 30.0000 mL | Freq: Every day | ORAL | Status: DC | PRN

## 2023-03-30 MED ORDER — FUROSEMIDE 10 MG/ML IJ SOLN
40.0000 mg | Freq: Two times a day (BID) | INTRAMUSCULAR | Status: DC
  Administered 2023-03-30 – 2023-03-31 (×2): 40 mg via INTRAVENOUS
  Filled 2023-03-30 (×2): qty 4

## 2023-03-30 MED ORDER — INSULIN ASPART 100 UNIT/ML IJ SOLN
0.0000 [IU] | Freq: Three times a day (TID) | INTRAMUSCULAR | Status: DC
  Administered 2023-03-30: 3 [IU] via SUBCUTANEOUS
  Administered 2023-03-30: 8 [IU] via SUBCUTANEOUS
  Administered 2023-03-31 – 2023-04-01 (×3): 3 [IU] via SUBCUTANEOUS
  Administered 2023-04-01: 5 [IU] via SUBCUTANEOUS
  Administered 2023-04-02: 8 [IU] via SUBCUTANEOUS
  Administered 2023-04-02: 5 [IU] via SUBCUTANEOUS
  Administered 2023-04-02: 3 [IU] via SUBCUTANEOUS
  Filled 2023-03-30 (×9): qty 1

## 2023-03-30 MED ORDER — HYDRALAZINE HCL 20 MG/ML IJ SOLN: 10.0000 mg | Freq: Four times a day (QID) | INTRAMUSCULAR | Status: DC | PRN

## 2023-03-30 MED ORDER — METOPROLOL SUCCINATE ER 25 MG PO TB24
25.0000 mg | ORAL_TABLET | Freq: Every day | ORAL | Status: DC
  Administered 2023-03-30 – 2023-04-03 (×4): 25 mg via ORAL
  Filled 2023-03-30 (×4): qty 1

## 2023-03-30 MED ORDER — LIVING WELL WITH DIABETES BOOK
Freq: Once | Status: AC
  Filled 2023-03-30: qty 1

## 2023-03-30 MED ORDER — NITROGLYCERIN 2 % TD OINT
1.0000 [in_us] | TOPICAL_OINTMENT | Freq: Once | TRANSDERMAL | Status: AC
  Administered 2023-03-30: 1 [in_us] via TOPICAL
  Filled 2023-03-30: qty 1

## 2023-03-30 MED ORDER — ORAL CARE MOUTH RINSE: 15.0000 mL | OROMUCOSAL | Status: DC | PRN

## 2023-03-30 MED ORDER — CHLORHEXIDINE GLUCONATE CLOTH 2 % EX PADS
6.0000 | MEDICATED_PAD | Freq: Every day | CUTANEOUS | Status: DC
  Administered 2023-03-30 – 2023-04-02 (×4): 6 via TOPICAL

## 2023-03-30 MED ORDER — INSULIN GLARGINE-YFGN 100 UNIT/ML ~~LOC~~ SOLN
10.0000 [IU] | Freq: Every day | SUBCUTANEOUS | Status: DC
  Administered 2023-03-30 – 2023-03-31 (×2): 10 [IU] via SUBCUTANEOUS
  Filled 2023-03-30 (×2): qty 0.1

## 2023-03-30 MED ORDER — FUROSEMIDE 10 MG/ML IJ SOLN
40.0000 mg | Freq: Once | INTRAMUSCULAR | Status: AC
  Administered 2023-03-30: 40 mg via INTRAVENOUS
  Filled 2023-03-30: qty 4

## 2023-03-30 MED ORDER — LOSARTAN POTASSIUM 25 MG PO TABS
25.0000 mg | ORAL_TABLET | Freq: Every day | ORAL | Status: DC
  Administered 2023-03-30 – 2023-03-31 (×2): 25 mg via ORAL
  Filled 2023-03-30 (×2): qty 1

## 2023-03-30 MED ORDER — TRAZODONE HCL 50 MG PO TABS: 25.0000 mg | ORAL_TABLET | Freq: Every evening | ORAL | Status: DC | PRN

## 2023-03-30 MED ORDER — FUROSEMIDE 10 MG/ML IJ SOLN
40.0000 mg | Freq: Two times a day (BID) | INTRAMUSCULAR | Status: DC
  Administered 2023-03-30: 40 mg via INTRAVENOUS
  Filled 2023-03-30: qty 4

## 2023-03-30 MED ORDER — ACETAMINOPHEN 650 MG RE SUPP: 650.0000 mg | Freq: Four times a day (QID) | RECTAL | Status: DC | PRN

## 2023-03-30 MED ORDER — NICARDIPINE HCL IN NACL 20-0.86 MG/200ML-% IV SOLN
3.0000 mg/h | INTRAVENOUS | Status: DC
  Administered 2023-03-30: 5 mg/h via INTRAVENOUS
  Filled 2023-03-30 (×2): qty 200

## 2023-03-30 MED ORDER — VARENICLINE TARTRATE 0.5 MG PO TABS
1.0000 mg | ORAL_TABLET | Freq: Two times a day (BID) | ORAL | Status: DC
  Administered 2023-03-30 – 2023-04-03 (×9): 1 mg via ORAL
  Filled 2023-03-30 (×9): qty 2

## 2023-03-30 MED ORDER — ENOXAPARIN SODIUM 40 MG/0.4ML IJ SOSY
40.0000 mg | PREFILLED_SYRINGE | INTRAMUSCULAR | Status: DC
  Administered 2023-03-30 – 2023-04-02 (×4): 40 mg via SUBCUTANEOUS
  Filled 2023-03-30 (×4): qty 0.4

## 2023-03-30 MED ORDER — MORPHINE SULFATE (PF) 4 MG/ML IV SOLN
4.0000 mg | Freq: Once | INTRAVENOUS | Status: AC
  Administered 2023-03-30: 4 mg via INTRAVENOUS
  Filled 2023-03-30: qty 1

## 2023-03-30 MED ORDER — ONDANSETRON HCL 4 MG/2ML IJ SOLN
4.0000 mg | Freq: Once | INTRAMUSCULAR | Status: AC
  Administered 2023-03-30: 4 mg via INTRAVENOUS
  Filled 2023-03-30: qty 2

## 2023-03-30 NOTE — ED Notes (Signed)
Pt taken to room 2 via wheelchair: Dr. Dolores Frame & McKenzie, RN present at the bedside - RT en route. Pt assisted to the bed and placed in a hospital gown and attached to the monitor.

## 2023-03-30 NOTE — ED Provider Notes (Signed)
[facility] Provider Note    Event Date/Time   First MD Initiated Contact with Patient 03/30/23 [phone]     (approximate)   History   Shortness of Breath   HPI  Jane Morrison is a 62 y.o. female presents to the ED from home with a chief complaint of shortness of breath.  Patient with a history of CHF not on diuretics who has been experiencing progressive shortness of breath and slight cough over the past several days.  Endorses associated chest tightness.  Denies fever/chills, abdominal pain, nausea, vomiting or dizziness.  Denies recent travel, trauma or hormone use.     Past Medical History   Past Medical History:  Diagnosis Date   Anxiety    Depression    Diabetes mellitus without complication    Hyperlipidemia    Hypertension      Active Problem List   Patient Active Problem List   Diagnosis Date Noted   Type 2 diabetes mellitus with hyperglycemia, with long-term current use of insulin 03/22/2023   Tobacco dependence 06/13/2022   Essential hypertension 04/05/2019     Past Surgical History   Past Surgical History:  Procedure Laterality Date   COLONOSCOPY WITH PROPOFOL N/A 06/29/2022   Procedure: COLONOSCOPY WITH PROPOFOL;  Surgeon: Wyline Mood, MD;  Location: [facility] ENDOSCOPY;  Service: Gastroenterology;  Laterality: N/A;   TUBAL LIGATION       Home Medications   Prior to Admission medications   Medication Sig Start Date End Date Taking? Authorizing Provider  Accu-Chek Softclix Lancets lancets Use as instructed Patient not taking: Reported on 03/22/2023 06/15/22   Berniece Salines, FNP  blood glucose meter kit and supplies KIT Dispense based on patient and insurance preference. Use up to four times daily as directed. (FOR ICD-9 250.00, 250.01). Patient not taking: Reported on 03/22/2023 06/15/22   Berniece Salines, FNP  Glucagon (GVOKE HYPOPEN 1-PACK) 0.5 MG/0.1ML SOAJ Inject 0.5 mg into the skin once as needed for up to 1 dose (for  hypoglycemia). 06/15/22   Berniece Salines, FNP  Glucose Blood (BLOOD GLUCOSE TEST STRIPS) STRP Use as directed to monitor FSBS once daily for DM Patient not taking: Reported on 03/22/2023 06/15/22   Berniece Salines, FNP  insulin degludec (TRESIBA FLEXTOUCH) 100 UNIT/ML FlexTouch Pen Inject 10 Units into the skin daily. 03/22/23   Berniece Salines, FNP  Insulin Pen Needle (BD PEN NEEDLE MICRO U/F) 32G X 6 MM MISC Use to administer insulin Patient not taking: Reported on 03/22/2023 02/06/23   Berniece Salines, FNP  losartan (COZAAR) 25 MG tablet Take 1 tablet (25 mg total) by mouth daily. 03/22/23   Berniece Salines, FNP  metFORMIN (GLUCOPHAGE) 500 MG tablet Take 1 tablet (500 mg total) by mouth 2 (two) times daily with a meal. 03/22/23   Berniece Salines, FNP  Varenicline Tartrate, Starter, (CHANTIX STARTING MONTH PAK) 0.5 MG X 11 & 1 MG X 42 TBPK Take 0.5 mg tablet by mouth 1x daily for 3 days, then increase to 0.5 mg tablet 2x daily for 4 days, then increase to 1 mg tablet 2x daily. 03/22/23   Berniece Salines, FNP     Allergies  Patient has no known allergies.   Family History   Family History  Problem Relation Age of Onset   Hypertension Mother    Diabetes Mother    Hypertension Father    Diabetes Father    Breast cancer Sister 27  Physical Exam  Triage Vital Signs: ED Triage Vitals [03/30/23 0054]  Enc Vitals Group     BP (!) 141/70     Pulse Rate (!) 136     Resp      Temp 98.2 F (36.8 C)     Temp Source Oral     SpO2 (!) 66 %     Weight      Height      Head Circumference      Peak Flow      Pain Score      Pain Loc      Pain Edu?      Excl. in GC?     Updated Vital Signs: BP (!) 166/120 (BP Location: Right Arm)   Pulse (!) 115   Temp 98.2 F (36.8 C) (Oral)   Resp (!) 26   Ht 5' 6.5" (1.689 m)   Wt 67.2 kg   SpO2 99%   BMI 23.55 kg/m    General: Awake, moderate distress.  CV:  Tachycardic.  Good peripheral perfusion.  Resp:  Increased effort.  Diminished,  faint bibasilar rales. Abd:  Nontender.  No distention.  Other:  No pedal edema.   ED Results / Procedures / Treatments  Labs (all labs ordered are listed, but only abnormal results are displayed) Labs Reviewed  CBC WITH DIFFERENTIAL/PLATELET - Abnormal; Notable for the following components:      Result Value   Platelets 403 (*)    Lymphs Abs 5.4 (*)    All other components within normal limits  COMPREHENSIVE METABOLIC PANEL - Abnormal; Notable for the following components:   Glucose, Bld 310 (*)    Creatinine, Ser 1.02 (*)    Calcium 8.4 (*)    Albumin 3.4 (*)    AST 221 (*)    ALT 131 (*)    Alkaline Phosphatase 173 (*)    All other components within normal limits  BRAIN NATRIURETIC PEPTIDE - Abnormal; Notable for the following components:   B Natriuretic Peptide 471.0 (*)    All other components within normal limits  TROPONIN I (HIGH SENSITIVITY) - Abnormal; Notable for the following components:   Troponin I (High Sensitivity) 41 (*)    All other components within normal limits  RESP PANEL BY RT-PCR (RSV, FLU A&B, COVID)  [id number]  BLOOD GAS, ARTERIAL     EKG  ED ECG REPORT I, Brytnee Bechler J, the attending physician, personally viewed and interpreted this ECG.   Date: 03/30/2023  EKG Time: 0100  Rate: 133  Rhythm: sinus tachycardia  Axis: LAD  Intervals:left anterior fascicular block  ST&T Change: No ST elevation to meet STEMI criteria  ED ECG REPORT I, Darrelle Barrell J, the attending physician, personally viewed and interpreted this ECG.   Date: 03/30/2023  EKG Time: 0112  Rate: 128  Rhythm: sinus tachycardia  Axis: LAD  Intervals:none  ST&T Change: No ST elevation to meet STEMI criteria   RADIOLOGY I have independently visualized and interpreted patient's chest x-ray as well as noted the radiology interpretation:  X-ray: Cardiomegaly with interstitial edema  Official radiology report(s): DG Chest Port 1 View  Result Date: 03/30/2023 CLINICAL DATA:   Shortness of breath EXAM: PORTABLE CHEST 1 VIEW COMPARISON:  02/20/2023 FINDINGS: Cardiomegaly with mild interstitial edema. Possible trace right pleural effusion. No pneumothorax. IMPRESSION: Cardiomegaly with mild interstitial edema and possible trace right pleural effusion. Electronically Signed   By: Charline Bills M.D.   On: 03/30/2023 01:30     PROCEDURES:  Critical Care performed: Yes, see critical care procedure note(s)  CRITICAL CARE Performed by: Irean Hong   Total critical care time: 45 minutes  Critical care time was exclusive of separately billable procedures and treating other patients.  Critical care was necessary to treat or prevent imminent or life-threatening deterioration.  Critical care was time spent personally by me on the following activities: development of treatment plan with patient and/or surrogate as well as nursing, discussions with consultants, evaluation of patient's response to treatment, examination of patient, obtaining history from patient or surrogate, ordering and performing treatments and interventions, ordering and review of laboratory studies, ordering and review of radiographic studies, pulse oximetry and re-evaluation of patient's condition.   Marland Kitchen1-3 Lead EKG Interpretation  Performed by: Irean Hong, MD Authorized by: Irean Hong, MD     Interpretation: abnormal     ECG rate:  136   ECG rate assessment: tachycardic     Rhythm: sinus tachycardia     Ectopy: none     Conduction: normal   Comments:     Patient placed on cardiac monitor to evaluate for arrhythmias    MEDICATIONS ORDERED IN ED: Medications  nitroGLYCERIN 50 mg in dextrose 5 % 250 mL (0.2 mg/mL) infusion (has no administration in time range)  morphine (PF) 4 MG/ML injection 4 mg (4 mg Intravenous Given 03/30/23 0114)  ondansetron (ZOFRAN) injection 4 mg (4 mg Intravenous Given 03/30/23 0114)  nitroGLYCERIN (NITROGLYN) 2 % ointment 1 inch (1 inch Topical Given 03/30/23  0114)  furosemide (LASIX) injection 40 mg (40 mg Intravenous Given 03/30/23 0120)     IMPRESSION / MDM / ASSESSMENT AND PLAN / ED COURSE  I reviewed the triage vital signs and the nursing notes.                             63 year old female with history of CHF presenting in respiratory distress with hypoxia. Differential includes, but is not limited to, viral syndrome, bronchitis including COPD exacerbation, pneumonia, reactive airway disease including asthma, CHF including exacerbation with or without pulmonary/interstitial edema, pneumothorax, ACS, thoracic trauma, and pulmonary embolism.  I personally reviewed patient's records and note a pulmonology office visit on 12/21/2022 for follow-up cigarette smoker.  No formal diagnosis of COPD.  Patient's presentation is most consistent with acute presentation with potential threat to life or bodily function.  The patient is on the cardiac monitor to evaluate for evidence of arrhythmia and/or significant heart rate changes.  Patient placed on BiPAP immediately upon her arrival to the treatment room.  Will obtain cardiac panel, chest x-ray.  Anticipate hospitalization.  Clinical Course as of 03/30/23 0154  Thu Mar 30, 2023  0105 Repeat vital signs and treatment room demonstrates blood pressure 206/162.  Likely CHF exacerbation given patient's history of.  Administer nitroglycerin paste, IV Morphine.  Anticipate IV Lasix dependent on chest x-ray appearance.  Consider antihypertensive infusion if blood pressure continues to be elevated. [JS]  0110 Wet read portable chest x-ray demonstrates overt edema; will administer IV Lasix. [JS]  0118 Spoke with Dr. Darrold Junker, cardiologist on STEMI call who reviewed patient's EKGs and did not see diagnostic criteria to activate Cath Lab. [JS]  0147 BP remains elevated, will initiate Cardene gtt. Patient breathing improved on BiPAP. Have updated patient of all test results and consulted hospitalist services for  evaluation and admission. [JS]  0154 Dr. Arville Care prefers nitroglycerin gtt; will change order.  [JS]  Clinical Course User Index [JS] Irean Hong, MD     FINAL CLINICAL IMPRESSION(S) / ED DIAGNOSES   Final diagnoses:  Shortness of breath  Hypoxia  Respiratory distress  Acute on chronic congestive heart failure, unspecified heart failure type  Hypertensive urgency  Elevated troponin     Rx / DC Orders   ED Discharge Orders     None        Note:  This document was prepared using Dragon voice recognition software and may include unintentional dictation errors.   Irean Hong, MD 03/30/23 [phone]

## 2023-03-30 NOTE — Progress Notes (Signed)
*  PRELIMINARY RESULTS* Echocardiogram 2D Echocardiogram has been performed.  Jane Morrison 03/30/2023, 12:08 PM

## 2023-03-30 NOTE — Progress Notes (Signed)
Progress Note   Patient: Jane Morrison:[id number] DOB: 02/03/1961 DOA: 03/30/2023     0 DOS: the patient was seen and examined on 03/30/2023   Brief hospital course: Taken from H&P.  Jane Morrison is a 62 y.o. African-American female with medical history significant for hypertension, dyslipidemia, type 2 diabetes mellitus, anxiety and depression, who presented to the emergency room with acute onset of worsening dyspnea associated orthopnea and cough productive of clear sputum as well as wheezing that started earlier today.  She admits to chest pressure and denies any fever or chills.   ED course.  On arrival blood pressure was elevated with tachycardia and tachypnea, patient was hypoxic at 66% on room air and initially requiring nonrebreather and BiPAP. Labs pertinent for blood glucose of 310, alkaline phosphatase 173, AST 221, ALT 131, BNP 471.  Troponin 41>> 63.  CBC with thrombocytosis at 403, otherwise unremarkable EKG with sinus tachycardia, PVCs, RSR-in V1  Portable chest x-ray showed cardiomegaly with mild interstitial pulmonary edema and possible trace right pleural effusion.   Patient was given 40 mg of IV Lasix,4 mg of IV morphine sulfate and 4 mg of IV Zofran as well as 1 inch of Nitropaste followed by IV nitroglycerin drip.  Cardiology was also consulted  4/18: Blood pressure improved to 136/90.  Patient was able to wean off from BiPAP and nitro infusion, currently saturating well on 2 L of oxygen.  Labs stable.  Echocardiogram with new diagnosis of HFrEF with EF of 25 to 30%, regional wall motion abnormalities and grade 1 diastolic dysfunction.  Patient will need ischemic workup and further evaluation with cardiac catheterization by cardiology.    Assessment and Plan: * Acute HFrEF (heart failure with reduced ejection fraction) Patient with no prior diagnosis of heart failure.  Symptoms are concerning for heart failure and echocardiogram with new onset HFrEF with a EF  of 25 to 30%, regional wall motion abnormalities, severely depressed ventricular function and grade 1 diastolic dysfunction. Cardiology is on board-patient will need ischemic workup and cardiac catheterization. -Continue with IV diuresis -Daily weight and BMP -Strict intake and output  Acute respiratory failure with hypoxia Most likely secondary to acute heart failure causing pulmonary edema.  Patient initially required BiPAP and now transition to 2 L of oxygen. No baseline oxygen use -Continue with supplemental oxygen-wean as tolerated  Hypertensive urgency Patient presented with pulmonary edema causing acute hypoxic respiratory failure with elevated blood pressure.  Initially placed on nitroglycerin drip. Blood pressure improved and patient is being weaned off from infusion. -Continue metoprolol and losartan -As needed hydralazine  Uncontrolled type 2 diabetes mellitus with hyperglycemia, with long-term current use of insulin Patient has uncontrolled diabetes with hyperglycemia and most recent A1c of 10.4. He was on Tresiba and metformin at home. -Semglee 10 units daily -SSI  Tobacco dependence -Continue home Chantix   Subjective: Patient was sitting in chair comfortably when seen today.  Denies any shortness of breath.  She was feeling much improved.  Physical Exam: Vitals:   03/30/23 1100 03/30/23 1200 03/30/23 1300 03/30/23 1400  BP: 108/65 97/72 108/72 108/77  Pulse: 78 71 66 71  Resp: Temp:      TempSrc:      SpO2: 97% 98% 95% 95%  Weight:      Height:       General.  Well-developed lady, in no acute distress. Pulmonary.  Few basal crackles bilaterally, normal respiratory effort. CV.  Regular rate and rhythm,  no JVD, rub or murmur. Abdomen.  Soft, nontender, nondistended, BS positive. CNS.  Alert and oriented .  No focal neurologic deficit. Extremities.  No edema, no cyanosis, pulses intact and symmetrical. Psychiatry.  Judgment and insight appears  normal.   Data Reviewed: Prior data reviewed  Family Communication: Discussed with patient  Disposition: Status is: Inpatient Remains inpatient appropriate because: Severity of illness  Planned Discharge Destination: Home  DVT prophylaxis.  Lovenox Time spent:  minutes  This record has been created using Conservation officer, historic buildings. Errors have been sought and corrected,but may not always be located. Such creation errors do not reflect on the standard of care.   Author: Arnetha Courser, MD 03/30/2023 2:34 PM  For on call review [email or web address].

## 2023-03-30 NOTE — H&P (Signed)
[place]   PATIENT NAME: Jane Morrison    MR#:  [id number]  DATE OF BIRTH:  Jul 11, 1961  DATE OF ADMISSION:  03/30/2023  PRIMARY CARE PHYSICIAN: Berniece Salines, FNP   Patient is coming from: Home  REQUESTING/REFERRING PHYSICIAN: Chiquita Loth, MD  CHIEF COMPLAINT:   Chief Complaint  Patient presents with   Shortness of Breath    HISTORY OF PRESENT ILLNESS:  Jane Morrison is a 62 y.o. African-American female with medical history significant for hypertension, dyslipidemia, type 2 diabetes mellitus, anxiety and depression, who presented to the emergency room with acute onset of worsening dyspnea associated orthopnea and cough productive of clear sputum as well as wheezing that started earlier today.  She admits to chest pressure and denies any fever or chills.  No dysuria, oliguria or hematuria or flank pain.  No headache or dizziness or blurred vision.  No nausea or vomiting or abdominal pain.  She denies any lower extremity edema.  ED Course: When she came to the ER, BP was 194/120 and later 1 206/162 with a heart rate of 132 respiratory to 33 and pulse 70 was 66 per center on room air and 100% on 40% FiO2 and nonrebreather.  Patient was then placed on BiPAP with a pulse currently of 99% on 40% FiO2.  Labs revealed a blood glucose of 310, calcium 8.4 and alk phos 173 with albumin 3.4, AST 221 ALT 131 with BNP 471, high sensitive troponin was 41 and later 63 and CBC showed thrombocytosis of 403 and was otherwise unremarkable.  Respiratory panel came back negative. EKG as reviewed by me : .EKG showed sinus tachycardia with a rate of 128 with PVCs, RSR-in V1. Imaging: Portable chest x-ray showed cardiomegaly with mild interstitial pulmonary edema and possible trace right pleural effusion.  The patient was given 40 mg of IV Lasix, 4 mg of IV morphine sulfate and 4 mg of IV Zofran as well as 1 inch of Nitropaste followed by IV nitroglycerin drip.  She will be admitted to a stepdown  unit bed for further evaluation and management PAST MEDICAL HISTORY:   Past Medical History:  Diagnosis Date   Anxiety    Depression    Diabetes mellitus without complication    Hyperlipidemia    Hypertension     PAST SURGICAL HISTORY:   Past Surgical History:  Procedure Laterality Date   COLONOSCOPY WITH PROPOFOL N/A 06/29/2022   Procedure: COLONOSCOPY WITH PROPOFOL;  Surgeon: Wyline Mood, MD;  Location: [facility] ENDOSCOPY;  Service: Gastroenterology;  Laterality: N/A;   TUBAL LIGATION      SOCIAL HISTORY:   Social History   Tobacco Use   Smoking status: Every Day    Packs/day: 0.50    Years: 40.00    Additional pack years: 0.00    Total pack years: 20.00    Types: Cigarettes   Smokeless tobacco: Never  Substance Use Topics   Alcohol use: No    FAMILY HISTORY:   Family History  Problem Relation Age of Onset   Hypertension Mother    Diabetes Mother    Hypertension Father    Diabetes Father    Breast cancer Sister 73    DRUG ALLERGIES:  No Known Allergies  REVIEW OF SYSTEMS:   ROS As per history of present illness. All pertinent systems were reviewed above. Constitutional, HEENT, cardiovascular, respiratory, GI, GU, musculoskeletal, neuro, psychiatric, endocrine, integumentary and hematologic systems were reviewed and are otherwise negative/unremarkable except for positive findings  mentioned above in the HPI.   MEDICATIONS AT HOME:   Prior to Admission medications   Medication Sig Start Date End Date Taking? Authorizing Provider  Glucagon (GVOKE HYPOPEN 1-PACK) 0.5 MG/0.1ML SOAJ Inject 0.5 mg into the skin once as needed for up to 1 dose (for hypoglycemia). 06/15/22  Yes Berniece Salines, FNP  insulin degludec (TRESIBA FLEXTOUCH) 100 UNIT/ML FlexTouch Pen Inject 10 Units into the skin daily. 03/22/23  Yes Berniece Salines, FNP  losartan (COZAAR) 25 MG tablet Take 1 tablet (25 mg total) by mouth daily. 03/22/23  Yes Berniece Salines, FNP  metFORMIN (GLUCOPHAGE) 500  MG tablet Take 1 tablet (500 mg total) by mouth 2 (two) times daily with a meal. 03/22/23  Yes Berniece Salines, FNP  Varenicline Tartrate, Starter, (CHANTIX STARTING MONTH PAK) 0.5 MG X 11 & 1 MG X 42 TBPK Take 0.5 mg tablet by mouth 1x daily for 3 days, then increase to 0.5 mg tablet 2x daily for 4 days, then increase to 1 mg tablet 2x daily. 03/22/23  Yes Berniece Salines, FNP  Accu-Chek Softclix Lancets lancets Use as instructed Patient not taking: Reported on 03/22/2023 06/15/22   Berniece Salines, FNP  blood glucose meter kit and supplies KIT Dispense based on patient and insurance preference. Use up to four times daily as directed. (FOR ICD-9 250.00, 250.01). Patient not taking: Reported on 03/22/2023 06/15/22   Berniece Salines, FNP  Glucose Blood (BLOOD GLUCOSE TEST STRIPS) STRP Use as directed to monitor FSBS once daily for DM Patient not taking: Reported on 03/22/2023 06/15/22   Berniece Salines, FNP  Insulin Pen Needle (BD PEN NEEDLE MICRO U/F) 32G X 6 MM MISC Use to administer insulin Patient not taking: Reported on 03/22/2023 02/06/23   Berniece Salines, FNP      VITAL SIGNS:  Blood pressure (!) 126/100, pulse 73, temperature 98.6 F (37 C), resp. rate 16, height 5' 6.5" (1.689 m), weight 67.2 kg, SpO2 97 %.  PHYSICAL EXAMINATION:  Physical Exam  GENERAL: Acutely ill 62 y.o.-year-old African-American working patient semilying in bed with mild respiratory distress on BiPAP EYES: Pupils equal, round, reactive to light and accommodation. No scleral icterus. Extraocular muscles intact.  HEENT: Head atraumatic, normocephalic. Oropharynx and nasopharynx clear.  NECK:  Supple, no jugular venous distention. No thyroid enlargement, no tenderness.  LUNGS: Diminished bibasilar breath sounds with bibasal rales.. No use of accessory muscles of respiration.  CARDIOVASCULAR: Regular rate and rhythm, S1, S2 normal. No murmurs, rubs, or gallops.  ABDOMEN: Soft, nondistended, nontender. Bowel sounds present. No  organomegaly or mass.  EXTREMITIES: No pedal edema, cyanosis, or clubbing.  NEUROLOGIC: Cranial nerves II through XII are intact. Muscle strength 5/5 in all extremities. Sensation intact. Gait not checked.  PSYCHIATRIC: The patient is alert and oriented x 3.  Normal affect and good eye contact. SKIN: No obvious rash, lesion, or ulcer.   LABORATORY PANEL:   CBC Recent Labs  Lab 03/30/23 0536  WBC 11.0*  HGB 13.4  HCT 40.2  PLT 339   ------------------------------------------------------------------------------------------------------------------  Chemistries  Recent Labs  Lab 03/30/23 0101 03/30/23 0536  NA 137 139  K 3.5 4.1  CL 103 102  CO2 24 29  GLUCOSE 310* 260*  BUN 14 13  CREATININE 1.02* 0.93  CALCIUM 8.4* 8.4*  AST 221*  --   ALT 131*  --   ALKPHOS 173*  --   BILITOT 0.5  --    ------------------------------------------------------------------------------------------------------------------  Cardiac Enzymes  No results for input(s): "TROPONINI" in the last 168 hours. ------------------------------------------------------------------------------------------------------------------  RADIOLOGY:  DG Chest Port 1 View  Result Date: 03/30/2023 CLINICAL DATA:  Shortness of breath EXAM: PORTABLE CHEST 1 VIEW COMPARISON:  02/20/2023 FINDINGS: Cardiomegaly with mild interstitial edema. Possible trace right pleural effusion. No pneumothorax. IMPRESSION: Cardiomegaly with mild interstitial edema and possible trace right pleural effusion. Electronically Signed   By: Charline Bills M.D.   On: 03/30/2023 01:30      IMPRESSION AND PLAN:  Assessment and Plan: * Acute respiratory failure with hypoxia - This is clearly secondary to her acute CHF. - She will be admitted to a stepdown unit bed. - We will continue her on BiPAP and taper it off as tolerated. - Management otherwise as below.  Acute on chronic diastolic CHF (congestive heart failure) - This is Acute CHF.   I suspect diastolic etiology. - Hypertensive urgency could be the main culprit for it. - Will diurese with IV Lasix. - Daily weights and strict I's and O's will be monitored. - Cardiology consult to be obtained. - I notified Dr. Darrold Junker about the patient. - We will obtain a 2D echo.  Hypertensive urgency - This is likely the culprit for her acute CHF. - The patient is placed on IV nitroglycerin drip. - We will continue her antihypertensives.  Uncontrolled type 2 diabetes mellitus with hyperglycemia, with long-term current use of insulin - The patient will be placed on supplemental coverage with NovoLog. - We will hold off metformin. - We will continue basal coverage.    DVT prophylaxis: Lovenox.  Advanced Care Planning:  Code Status: full code.  Family Communication:  The plan of care was discussed in details with the patient (and family). I answered all questions. The patient agreed to proceed with the above mentioned plan. Further management will depend upon hospital course. Disposition Plan: Back to previous home environment Consults called: Cardiology All the records are reviewed and case discussed with ED provider.  Status is: Inpatient   At the time of the admission, it appears that the appropriate admission status for this patient is inpatient.  This is judged to be reasonable and necessary in order to provide the required intensity of service to ensure the patient's safety given the presenting symptoms, physical exam findings and initial radiographic and laboratory data in the context of comorbid conditions.  The patient requires inpatient status due to high intensity of service, high risk of further deterioration and high frequency of surveillance required.  I certify that at the time of admission, it is my clinical judgment that the patient will require inpatient hospital care extending more than 2 midnights.                            Dispo: The patient is from: Home               Anticipated d/c is to: Home              Patient currently is not medically stable to d/c.              Difficult to place patient: No  Authorized and performed by: Valente David, MD Total critical care time: Approximately 55     minutes. Due to a high probability of clinically significant, life-threatening deterioration, the patient required my highest level of preparedness to intervene emergently and I personally spent this critical care time directly and personally managing the patient.  This critical care time included obtaining a history, examining the patient, pulse oximetry, ordering and review of studies, arranging urgent treatment with development of management plan, evaluation of patient's response to treatment, frequent reassessment, and discussions with other providers. This critical care time was performed to assess and manage the high probability of imminent, life-threatening deterioration that could result in multiorgan failure.  It was exclusive of separately billable procedures and treating other patients and teaching time.   Hannah Beat M.D on 03/30/2023 at 7:06 AM  [facility]   From 7 PM-7 AM, contact night-coverage [email or web address]  CC: Primary care physician; Berniece Salines, FNP

## 2023-03-30 NOTE — Assessment & Plan Note (Addendum)
Most likely secondary to acute heart failure causing pulmonary edema.  Patient initially required BiPAP and now transition to 2 L of oxygen. No baseline oxygen use -Continue with supplemental oxygen-wean as tolerated

## 2023-03-30 NOTE — Assessment & Plan Note (Addendum)
Patient presented with pulmonary edema causing acute hypoxic respiratory failure with elevated blood pressure.  Initially placed on nitroglycerin drip. Blood pressure improved and patient is being weaned off from infusion. -Continue metoprolol and losartan -As needed hydralazine

## 2023-03-30 NOTE — Assessment & Plan Note (Deleted)
-   This is Acute CHF.  I suspect diastolic etiology. - Hypertensive urgency could be the main culprit for it. - Will diurese with IV Lasix. - Daily weights and strict I's and O's will be monitored. - Cardiology consult to be obtained. - I notified Dr. Darrold Junker about the patient. - We will obtain a 2D echo.

## 2023-03-30 NOTE — Assessment & Plan Note (Signed)
-  Continue home Chantix

## 2023-03-30 NOTE — Consult Note (Signed)
[facility] CARDIOLOGY CONSULT NOTE       Patient ID: Jane Morrison MRN: [id number] DOB/AGE: [birth year] 60 y.o.  Admit date: 03/30/2023 Referring Physician Dr Valente David Primary Physician Della Goo, FNP  Primary Cardiologist prev Dr. Juliann Pares Reason for Consultation new CHF  HPI: Jane Morrison is a 62yoF with a PMH of insulin dependent DM2 (A1c 10% 03/2023), HTN, current tobacco use who presented to [facility] ED 03/30/2023 with shortness of breath. Found by EMS hypoxic on room air initially requiring BiPAP and was hypertensive to 206/162.  Cardiology is consulted out of concern for new onset heart failure.  Patient was seen and examined in stepdown this morning and she provides the entirety of the history.  She was in her usual state of health earlier in the week, and yesterday after she took a shower and was getting ready for bed, she suddenly felt short of breath.  She tried to lay down and take deep breaths but this only made her dyspnea worse.  Also complained of chest tightness and sensation of "a weight on her chest" that felt better once her breathing improved.  She had never had any symptoms like this before, denied heart racing or palpitations, or peripheral edema.  At my time of evaluation this morning, the patient is sitting upright in bed in stepdown, resting comfortably with her eyes closed and awakens easily to voice.  She says she feels overall better compared to when she first presented, dyspnea has resolved, and denies chest pain.   She has been seen once by Dr. Juliann Pares over 10 years ago for "chest pain" she had a treadmill Myoview done which showed a borderline apical defect that could possibly be apical thinning/scar versus artifact for which medical management was recommended.  She has not been followed with cardiology since then and denies any additional ischemic workup or a heart catheterization.  Currently smokes half pack per day of cigarettes, denies current  alcohol use.  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  In the emergency department she was started on a nitroglycerin infusion, and given IV Lasix x 1.  She has been weaned off BiPAP to 2 L by nasal cannula.  Recent vitals notable for a blood pressure of 144/99, heart rate in the 70s in sinus rhythm on telemetry.  Labs notable for BUN/creatinine is improving from 14/1.02 and GFR >60 down to BUN/creatinine 13/0.93 and GFR >60.  Potassium within normal limits at 4.1 high-sensitivity troponin elevated with a flat trend at 41, 63, 63.  BNP elevated at 471.  AST ALT elevated at 221/131 respectively, alk phos also elevated to 173.  Chest x-ray with cardiomegaly and interstitial edema with a possible trace right pleural effusion.  Review of systems complete and found to be negative unless listed above     Past Medical History:  Diagnosis Date   Anxiety    Depression    Diabetes mellitus without complication    Hyperlipidemia    Hypertension     Past Surgical History:  Procedure Laterality Date   COLONOSCOPY WITH PROPOFOL N/A 06/29/2022   Procedure: COLONOSCOPY WITH PROPOFOL;  Surgeon: Wyline Mood, MD;  Location: [facility] ENDOSCOPY;  Service: Gastroenterology;  Laterality: N/A;   TUBAL LIGATION      Medications Prior to Admission  Medication Sig Dispense Refill Last Dose   Glucagon (GVOKE HYPOPEN 1-PACK) 0.5 MG/0.1ML SOAJ Inject 0.5 mg into the skin once as needed for  up to 1 dose (for hypoglycemia). 0.1 mL 3 prn   insulin degludec (TRESIBA FLEXTOUCH) 100 UNIT/ML FlexTouch Pen Inject 10 Units into the skin daily. 3 mL 3 03/29/2023 at 0800   losartan (COZAAR) 25 MG tablet Take 1 tablet (25 mg total) by mouth daily. 90 tablet 0 03/29/2023 at 0800   metFORMIN (GLUCOPHAGE) 500 MG tablet Take 1 tablet (500 mg total) by mouth 2 (two) times daily with a meal. 180 tablet 0 03/29/2023   Varenicline Tartrate, Starter, (CHANTIX STARTING MONTH PAK) 0.5 MG X 11 & 1 MG X 42 TBPK  Take 0.5 mg tablet by mouth 1x daily for 3 days, then increase to 0.5 mg tablet 2x daily for 4 days, then increase to 1 mg tablet 2x daily. 1 each 0 03/29/2023   Accu-Chek Softclix Lancets lancets Use as instructed (Patient not taking: Reported on 03/22/2023) 100 each 12    blood glucose meter kit and supplies KIT Dispense based on patient and insurance preference. Use up to four times daily as directed. (FOR ICD-9 250.00, 250.01). (Patient not taking: Reported on 03/22/2023) 1 each 0    Glucose Blood (BLOOD GLUCOSE TEST STRIPS) STRP Use as directed to monitor FSBS once daily for DM (Patient not taking: Reported on 03/22/2023) 100 strip 3    Insulin Pen Needle (BD PEN NEEDLE MICRO U/F) 32G X 6 MM MISC Use to administer insulin (Patient not taking: Reported on 03/22/2023) 100 each 0    Social History   Socioeconomic History   Marital status: Married    Spouse name: Not on file   Number of children: 3   Years of education: Not on file   Highest education level: Associate degree: academic program  Occupational History   Not on file  Tobacco Use   Smoking status: Every Day    Packs/day: 0.50    Years: 40.00    Additional pack years: 0.00    Total pack years: 20.00    Types: Cigarettes   Smokeless tobacco: Never  Vaping Use   Vaping Use: Never used  Substance and Sexual Activity   Alcohol use: No   Drug use: No   Sexual activity: Yes  Other Topics Concern   Not on file  Social History Narrative   3 biological kids 4 stepchildren   Social Determinants of Health   Financial Resource Strain: Low Risk  (03/21/2023)   Overall Financial Resource Strain (CARDIA)    Difficulty of Paying Living Expenses: Not hard at all  Food Insecurity: Unknown (03/21/2023)   Hunger Vital Sign    Worried About Running Out of Food in the Last Year: Patient declined    Ran Out of Food in the Last Year: Never true  Transportation Needs: No Transportation Needs (03/21/2023)   PRAPARE - Scientist, research (physical sciences) (Medical): No    Lack of Transportation (Non-Medical): No  Physical Activity: Insufficiently Active (03/21/2023)   Exercise Vital Sign    Days of Exercise per Week: 3 days    Minutes of Exercise per Session: 10 min  Stress: Patient Declined (03/21/2023)   [employer] of Occupational Health - Occupational Stress Questionnaire    Feeling of Stress : Patient declined  Social Connections: Socially Integrated (03/21/2023)   Social Connection and Isolation Panel [NHANES]    Frequency of Communication with Friends and Family: Once a week    Frequency of Social Gatherings with Friends and Family: More than three times a week    Attends Religious  Services: More than 4 times per year    Active Member of Clubs or Organizations: Yes    Attends Banker Meetings: 1 to 4 times per year    Marital Status: Married  Catering manager Violence: Not on file    Family History  Problem Relation Age of Onset   Hypertension Mother    Diabetes Mother    Hypertension Father    Diabetes Father    Breast cancer Sister 71      Intake/Output Summary (Last 24 hours) at 03/30/2023 0827 Last data filed at 03/30/2023 0549 Gross per 24 hour  Intake 11.96 ml  Output 1800 ml  Net -1788.04 ml    Vitals:   03/30/23 0715 03/30/23 0730 03/30/23 0745 03/30/23 0825  BP: 123/83 126/83 125/77   Pulse: 67 73 64   Resp: Temp:      TempSrc:      SpO2: 96% 96% 98% 98%  Weight:      Height:        PHYSICAL EXAM General: Pleasant thin black female, well nourished, in no acute distress.  Sitting upright in bed HEENT:  Normocephalic and atraumatic. Neck:  No JVD.  Lungs: Normal respiratory effort on oxygen by nasal cannula.  Trace crackles bilaterally without appreciable wheezes.  Dry cough with deep inspiration Heart: HRRR . Normal S1 and S2 without gallops or murmurs.  Abdomen: Non-distended appearing.  Msk: Normal strength and tone for age. Extremities: Warm and well  perfused. No clubbing, cyanosis.  No peripheral edema.  Neuro: Alert and oriented X 3. Psych:  Answers questions appropriately.   Labs: Basic Metabolic Panel: Recent Labs    03/30/23 0101 03/30/23 0536  NA 137 139  K 3.5 4.1  CL 103 102  CO2 24 29  GLUCOSE 310* 260*  BUN 14 13  CREATININE 1.02* 0.93  CALCIUM 8.4* 8.4*   Liver Function Tests: Recent Labs    03/30/23 0101  AST 221*  ALT 131*  ALKPHOS 173*  BILITOT 0.5  PROT 7.3  ALBUMIN 3.4*   No results for input(s): "LIPASE", "AMYLASE" in the last 72 hours. CBC: Recent Labs    03/30/23 0101 03/30/23 0536  WBC 10.4 11.0*  NEUTROABS 4.2  --   HGB 13.9 13.4  HCT 42.6 40.2  MCV 91.8 89.9  PLT 403* 339   Cardiac Enzymes: Recent Labs    03/30/23 0101 03/30/23 0313  TROPONINIHS 41* 63*   BNP: Recent Labs    03/30/23 0101  BNP 471.0*   D-Dimer: No results for input(s): "DDIMER" in the last 72 hours. Hemoglobin A1C: No results for input(s): "HGBA1C" in the last 72 hours. Fasting Lipid Panel: No results for input(s): "CHOL", "HDL", "LDLCALC", "TRIG", "CHOLHDL", "LDLDIRECT" in the last 72 hours. Thyroid Function Tests: No results for input(s): "TSH", "T4TOTAL", "T3FREE", "THYROIDAB" in the last 72 hours.  Invalid input(s): "FREET3" Anemia Panel: No results for input(s): "VITAMINB12", "FOLATE", "FERRITIN", "TIBC", "IRON", "RETICCTPCT" in the last 72 hours.   Radiology: [facility] Chest Port 1 View  Result Date: 03/30/2023 CLINICAL DATA:  Shortness of breath EXAM: PORTABLE CHEST 1 VIEW COMPARISON:  02/20/2023 FINDINGS: Cardiomegaly with mild interstitial edema. Possible trace right pleural effusion. No pneumothorax. IMPRESSION: Cardiomegaly with mild interstitial edema and possible trace right pleural effusion. Electronically Signed   By: Charline Bills M.D.   On: 03/30/2023 01:30    ECHO prior available for review  TELEMETRY reviewed by me (LT) 03/30/2023 : Sinus rhythm rate 60s  to 49s with occasional  premature atrial contractions  EKG reviewed by me: Sinus tachycardia with nonspecific TW abnormality, likely early repol  Data reviewed by me (LT) 03/30/2023: ed ntoe, admission H&P last 24h vitals tele labs imaging I/O    Principal Problem:   Acute respiratory failure with hypoxia Active Problems:   Hypertensive urgency   Uncontrolled type 2 diabetes mellitus with hyperglycemia, with long-term current use of insulin   Acute on chronic diastolic CHF (congestive heart failure)    ASSESSMENT AND PLAN:  Jane Morrison is a 62yoF with a PMH of insulin dependent DM2 (A1c 10% 03/2023), HTN, current tobacco use who presented to [facility] ED 03/30/2023 with shortness of breath. Found by EMS hypoxic on room air initially requiring BiPAP and was hypertensive to 206/162.  Cardiology is consulted out of concern for new onset heart failure.  # Acute hypoxic respiratory failure # Hypertensive emergency # Likely new HFpEF Presents with sudden onset shortness of breath and orthopnea, initially hypoxic requiring BiPAP with extremely elevated blood pressures, clinically improved after nitroglycerin infusion and initial IV Lasix.  BNP elevated to 471 and elevated JVP on exam.  Has risk factors of uncontrolled diabetes and ongoing tobacco use.  -Continue diuresis with IV Lasix 40 mg twice daily -Continue losartan 25 mg  -Start metoprolol XL 25 mg daily -Discontinue nitroglycerin infusion -Echo complete, further recommendations based on its results -Okay to transfer out of stepdown to the floor today  # DM2 Poorly controlled, but A1c has improved from >14% 9 months ago, to 10.4 earlier this month.  # Tobacco abuse Currently smokes half pack a day, started Chantix last week.  Motivated to quit entirely.  # Demand ischemia Minimal elevation with a flat trend at 41, 61, 61 which is most consistent with demand/supply mismatch and not ACS   This patient's plan of care was discussed and created with Dr.  Darrold Junker and he is in agreement.  Signed: Rebeca Allegra , PA-C 03/30/2023, 8:27 AM [facility]

## 2023-03-30 NOTE — Assessment & Plan Note (Addendum)
Patient with no prior diagnosis of heart failure.  Symptoms are concerning for heart failure and echocardiogram with new onset HFrEF with a EF of 25 to 30%, regional wall motion abnormalities, severely depressed ventricular function and grade 1 diastolic dysfunction. Cardiology is on board-patient will need ischemic workup and cardiac catheterization. -Continue with IV diuresis -Daily weight and BMP -Strict intake and output

## 2023-03-30 NOTE — Assessment & Plan Note (Addendum)
Patient has uncontrolled diabetes with hyperglycemia and most recent A1c of 10.4. He was on [place] and metformin at home. -Semglee 10 units daily -SSI

## 2023-03-30 NOTE — Hospital Course (Addendum)
Taken from H&P.  Jane Morrison is a 62 y.o. African-American female with medical history significant for hypertension, dyslipidemia, type 2 diabetes mellitus, anxiety and depression, who presented to the emergency room with acute onset of worsening dyspnea associated orthopnea and cough productive of clear sputum as well as wheezing that started earlier today.  She admits to chest pressure and denies any fever or chills.   ED course.  On arrival blood pressure was elevated with tachycardia and tachypnea, patient was hypoxic at 66% on room air and initially requiring nonrebreather and BiPAP. Labs pertinent for blood glucose of 310, alkaline phosphatase 173, AST 221, ALT 131, BNP 471.  Troponin 41>> 63.  CBC with thrombocytosis at 403, otherwise unremarkable EKG with sinus tachycardia, PVCs, RSR-in V1  Portable chest x-ray showed cardiomegaly with mild interstitial pulmonary edema and possible trace right pleural effusion.   Patient was given 40 mg of IV Lasix,4 mg of IV morphine sulfate and 4 mg of IV Zofran as well as 1 inch of Nitropaste followed by IV nitroglycerin drip.  Cardiology was also consulted  4/18: Blood pressure improved to 136/90.  Patient was able to wean off from BiPAP and nitro infusion, currently saturating well on 2 L of oxygen.  Labs stable.  Echocardiogram with new diagnosis of HFrEF with EF of 25 to 30%, regional wall motion abnormalities and grade 1 diastolic dysfunction.  Patient will need ischemic workup and further evaluation with cardiac catheterization by cardiology.  4/19: Vital stable.  BMP with slight worsening of creatinine to 1.08-patient is being diuresed, decreasing the dose of Lasix to daily.  Cardiology is planning cath on Monday morning. CBG elevated-increasing the dose of Semglee.  4/20: Hemodynamically stable.  Slowly worsening creatinine at 1.13 today.  Clinically appears euvolemic.  Received today's dose of Lasix.  Will hold Lasix tomorrow and encouraging  some p.o. hydration.  4/21: Hemodynamically stable.  Creatinine started improving after holding Lasix.  Clinically appears euvolemic.  Cardiac cath tomorrow morning.  4/22: Remained stable.  Had her cardiac catheterization today which shows 70% stenosis of mid RCA and 50% distal RCA.  EF was 35 to 45% by visual estimate.  No intervention was done and decided to proceed with medical management.  Right heart pressures were low also patient was euvolemic.  Cardiology cleared her for discharge on Entresto, spironolactone and Farxiga.  We did not added any diuretic or Lasix at this time as he appears euvolemic to dry.  Cardiology can add later on as needed.  Patient will continue on current medications and need to have a close follow-up with her providers for further recommendations.

## 2023-03-30 NOTE — ED Triage Notes (Signed)
Pt presents ambulatory to triage via POV with complaints of SOB - Hx of CHF; increased WOB. RA sat 66% - placed on [place] 6L improved to 92%.  A&Ox4 at this time. Denies fevers, chills, N/V/D.

## 2023-03-30 NOTE — ED Notes (Signed)
PT REPORTS CHEST PAIN RESOLVED. NITRO DRIP STOPPED AT THIS TIME.

## 2023-03-31 ENCOUNTER — Other Ambulatory Visit (HOSPITAL_COMMUNITY): Payer: Self-pay

## 2023-03-31 ENCOUNTER — Telehealth (HOSPITAL_COMMUNITY): Payer: Self-pay | Admitting: Pharmacy Technician

## 2023-03-31 DIAGNOSIS — I160 Hypertensive urgency: Secondary | ICD-10-CM | POA: Diagnosis not present

## 2023-03-31 DIAGNOSIS — E1165 Type 2 diabetes mellitus with hyperglycemia: Secondary | ICD-10-CM | POA: Diagnosis not present

## 2023-03-31 DIAGNOSIS — I5021 Acute systolic (congestive) heart failure: Secondary | ICD-10-CM | POA: Diagnosis not present

## 2023-03-31 DIAGNOSIS — R7989 Other specified abnormal findings of blood chemistry: Secondary | ICD-10-CM | POA: Diagnosis not present

## 2023-03-31 LAB — LIPID PANEL
Cholesterol: 159 mg/dL (ref 0–200)
HDL: 41 mg/dL (ref >40)
LDL Cholesterol: 93 mg/dL (ref 0–99)
Total CHOL/HDL Ratio: 3.9 RATIO
Triglycerides: 124 mg/dL (ref <150)
VLDL: 25 mg/dL (ref 0–40)

## 2023-03-31 LAB — BASIC METABOLIC PANEL
Anion gap: 10 (ref 5–15)
BUN: 21 mg/dL — ABNORMAL HIGH (ref 6–20)
CO2: 29 mmol/L (ref 22–32)
Calcium: 9 mg/dL (ref 8.9–10.3)
Chloride: 100 mmol/L (ref 98–111)
Creatinine, Ser: 1.08 mg/dL — ABNORMAL HIGH (ref 0.44–1.00)
GFR, Estimated: 59 mL/min — ABNORMAL LOW (ref >60)
Glucose, Bld: 175 mg/dL — ABNORMAL HIGH (ref 70–99)
Potassium: 4 mmol/L (ref 3.5–5.1)
Sodium: 139 mmol/L (ref 135–145)

## 2023-03-31 LAB — GLUCOSE, CAPILLARY
Glucose-Capillary: 160 mg/dL — ABNORMAL HIGH (ref 70–99)
Glucose-Capillary: 406 mg/dL — ABNORMAL HIGH (ref 70–99)
Glucose-Capillary: 95 mg/dL (ref 70–99)
Glucose-Capillary: 173 mg/dL — ABNORMAL HIGH (ref 70–99)

## 2023-03-31 MED ORDER — FUROSEMIDE 10 MG/ML IJ SOLN
40.0000 mg | Freq: Every day | INTRAMUSCULAR | Status: DC
  Administered 2023-04-01: 40 mg via INTRAVENOUS
  Filled 2023-03-31: qty 4

## 2023-03-31 MED ORDER — SACUBITRIL-VALSARTAN 24-26 MG PO TABS
1.0000 | ORAL_TABLET | Freq: Two times a day (BID) | ORAL | Status: DC
  Administered 2023-03-31 – 2023-04-03 (×5): 1 via ORAL
  Filled 2023-03-31 (×5): qty 1

## 2023-03-31 MED ORDER — ASPIRIN 81 MG PO CHEW
81.0000 mg | CHEWABLE_TABLET | Freq: Every day | ORAL | Status: DC
  Administered 2023-03-31 – 2023-04-02 (×3): 81 mg via ORAL
  Filled 2023-03-31 (×4): qty 1

## 2023-03-31 MED ORDER — INSULIN GLARGINE-YFGN 100 UNIT/ML ~~LOC~~ SOLN
10.0000 [IU] | Freq: Two times a day (BID) | SUBCUTANEOUS | Status: DC
  Administered 2023-03-31 – 2023-04-01 (×2): 10 [IU] via SUBCUTANEOUS
  Filled 2023-03-31 (×4): qty 0.1

## 2023-03-31 MED ORDER — DAPAGLIFLOZIN PROPANEDIOL 10 MG PO TABS
10.0000 mg | ORAL_TABLET | Freq: Every day | ORAL | Status: DC
  Administered 2023-03-31 – 2023-04-03 (×4): 10 mg via ORAL
  Filled 2023-03-31 (×4): qty 1

## 2023-03-31 MED ORDER — SPIRONOLACTONE 12.5 MG HALF TABLET
12.5000 mg | ORAL_TABLET | Freq: Every day | ORAL | Status: DC
  Administered 2023-03-31 – 2023-04-03 (×4): 12.5 mg via ORAL
  Filled 2023-03-31 (×4): qty 1

## 2023-03-31 MED ORDER — INSULIN ASPART 100 UNIT/ML IJ SOLN
20.0000 [IU] | Freq: Once | INTRAMUSCULAR | Status: AC
  Administered 2023-03-31: 20 [IU] via SUBCUTANEOUS
  Filled 2023-03-31: qty 1

## 2023-03-31 NOTE — Assessment & Plan Note (Signed)
Patient with no prior diagnosis of heart failure.  Symptoms are concerning for heart failure and echocardiogram with new onset HFrEF with a EF of 25 to 30%, regional wall motion abnormalities, severely depressed ventricular function and grade 1 diastolic dysfunction. Cardiology is on board-going for cardiac cath on Monday morning -Keep holding Lasix -Cardiology is switching losartan with Entresto -Spironolactone was also added -Daily weight and BMP -Strict intake and output

## 2023-03-31 NOTE — Progress Notes (Signed)
Report called to receiving Rn 255. Patient with no complaints. Will transfer via WC.

## 2023-03-31 NOTE — Assessment & Plan Note (Signed)
Patient has uncontrolled diabetes with hyperglycemia and most recent A1c of 10.4. She was on [place] and metformin at home.  CBG remained elevated -Increasing the dose of Semglee to 12 units twice daily -SSI

## 2023-03-31 NOTE — Telephone Encounter (Signed)
Patient Advocate Encounter   I was successful in securing patient grant from [employer] to provide copayment coverage for Heart Failure Copay Assistance. This will make the out of pocket cost $0.00.     RxBin: [id number] PCN:   AS Member ID: [id number] Group ID: [id number] Dates of Eligibility: 03/31/2023 through 12/12/2023    Roland Earl, CPhT Pharmacy Patient Advocate Specialist [facility] Patient Advocate Team Direct Number: [phone]  Fax: [phone]

## 2023-03-31 NOTE — Progress Notes (Cosign Needed Addendum)
Jane Morrison       Patient ID: FERRIS TALLY MRN: [id number] DOB/AGE: 60-Oct-1962 60 y.o.  Admit date: 03/30/2023 Referring Physician Dr Valente David Primary Physician Della Goo, FNP  Primary Cardiologist prev Dr. Juliann Pares Reason for Consultation new CHF  HPI: [name]. Watt is a 62yoF with a PMH of insulin dependent DM2 (A1c 10% 03/2023), HTN, current tobacco use who presented to [facility] ED 03/30/2023 with shortness of breath. Found by EMS hypoxic on room air initially requiring BiPAP and was hypertensive to 206/162.  Echo this admission resulted with new reduced ejection fraction with a EF 25-30% with severely reduced LF systolic function  mild-mod LV dilation, g1 DD, mild MR. Clinical improvement after initial diuresis.  Plan for [facility] on Monday 4/22.   Interval History:  -Continues to feel better without recurrent shortness of breath, still ongoing dry cough.  Back on 2L [place] this morning. -No chest pain or peripheral edema -renal function stable   Provides additional history this morning of having a prior left heart cath with Dr. Juliann Pares at least 10 years ago.  She does not recall having a stent placed.   Review of systems complete and found to be negative unless listed above     Past Medical History:  Diagnosis Date   Anxiety    Depression    Diabetes mellitus without complication    Hyperlipidemia    Hypertension     Past Surgical History:  Procedure Laterality Date   COLONOSCOPY WITH PROPOFOL N/A 06/29/2022   Procedure: COLONOSCOPY WITH PROPOFOL;  Surgeon: Wyline Mood, MD;  Location: [facility] ENDOSCOPY;  Service: Gastroenterology;  Laterality: N/A;   TUBAL LIGATION      Medications Prior to Admission  Medication Sig Dispense Refill Last Dose   Glucagon (GVOKE HYPOPEN 1-PACK) 0.5 MG/0.1ML SOAJ Inject 0.5 mg into the skin once as needed for up to 1 dose (for hypoglycemia). 0.1 mL 3 prn   insulin degludec (TRESIBA FLEXTOUCH) 100 UNIT/ML FlexTouch  Pen Inject 10 Units into the skin daily. 3 mL 3 03/29/2023 at 0800   losartan (COZAAR) 25 MG tablet Take 1 tablet (25 mg total) by mouth daily. 90 tablet 0 03/29/2023 at 0800   metFORMIN (GLUCOPHAGE) 500 MG tablet Take 1 tablet (500 mg total) by mouth 2 (two) times daily with a meal. 180 tablet 0 03/29/2023   Varenicline Tartrate, Starter, (CHANTIX STARTING MONTH PAK) 0.5 MG X 11 & 1 MG X 42 TBPK Take 0.5 mg tablet by mouth 1x daily for 3 days, then increase to 0.5 mg tablet 2x daily for 4 days, then increase to 1 mg tablet 2x daily. 1 each 0 03/29/2023   Accu-Chek Softclix Lancets lancets Use as instructed (Patient not taking: Reported on 03/22/2023) 100 each 12    blood glucose meter kit and supplies KIT Dispense based on patient and insurance preference. Use up to four times daily as directed. (FOR ICD-9 250.00, 250.01). (Patient not taking: Reported on 03/22/2023) 1 each 0    Glucose Blood (BLOOD GLUCOSE TEST STRIPS) STRP Use as directed to monitor FSBS once daily for DM (Patient not taking: Reported on 03/22/2023) 100 strip 3    Insulin Pen Needle (BD PEN NEEDLE MICRO U/F) 32G X 6 MM MISC Use to administer insulin (Patient not taking: Reported on 03/22/2023) 100 each 0    Social History   Socioeconomic History   Marital status: Married    Spouse name: Not on file   Number of children: 3  Years of education: Not on file   Highest education level: Associate degree: academic program  Occupational History   Not on file  Tobacco Use   Smoking status: Every Day    Packs/day: 0.50    Years: 40.00    Additional pack years: 0.00    Total pack years: 20.00    Types: Cigarettes   Smokeless tobacco: Never  Vaping Use   Vaping Use: Never used  Substance and Sexual Activity   Alcohol use: No   Drug use: No   Sexual activity: Yes  Other Topics Concern   Not on file  Social History Narrative   3 biological kids 4 stepchildren   Social Determinants of Health   Financial Resource Strain: Low Risk   (03/21/2023)   Overall Financial Resource Strain (CARDIA)    Difficulty of Paying Living Expenses: Not hard at all  Food Insecurity: Unknown (03/21/2023)   Hunger Vital Sign    Worried About Running Out of Food in the Last Year: Patient declined    Ran Out of Food in the Last Year: Never true  Transportation Needs: No Transportation Needs (03/21/2023)   PRAPARE - Administrator, Civil Service (Medical): No    Lack of Transportation (Non-Medical): No  Physical Activity: Insufficiently Active (03/21/2023)   Exercise Vital Sign    Days of Exercise per Week: 3 days    Minutes of Exercise per Session: 10 min  Stress: Patient Declined (03/21/2023)   [employer] of Occupational Health - Occupational Stress Questionnaire    Feeling of Stress : Patient declined  Social Connections: Socially Integrated (03/21/2023)   Social Connection and Isolation Panel [NHANES]    Frequency of Communication with Friends and Family: Once a week    Frequency of Social Gatherings with Friends and Family: More than three times a week    Attends Religious Services: More than 4 times per year    Active Member of [employer] or Organizations: Yes    Attends Banker Meetings: 1 to 4 times per year    Marital Status: Married  Catering manager Violence: Not on file    Family History  Problem Relation Age of Onset   Hypertension Mother    Diabetes Mother    Hypertension Father    Diabetes Father    Breast cancer Sister 71      Intake/Output Summary (Last 24 hours) at 03/31/2023 0902 Last data filed at 03/30/2023 1936 Gross per 24 hour  Intake 1122.05 ml  Output 800 ml  Net 322.05 ml     Vitals:   03/31/23 0400 03/31/23 0500 03/31/23 0600 03/31/23 0700  BP: 115/72 124/79 117/75 (!) 134/99  Pulse: 63 65 62 77  Resp: 19 16 19 19   Temp: 98.1 F (36.7 C)     TempSrc: Oral     SpO2: 92% 97% 93% 94%  Weight:  67 kg    Height:        PHYSICAL EXAM General: Pleasant thin black female, well  nourished, in no acute distress.  Sitting upright in bed HEENT:  Normocephalic and atraumatic. Neck:  No JVD.  Lungs: Normal respiratory effort on oxygen by nasal cannula.  Trace crackles bilaterally without appreciable wheezes.  Dry cough with deep inspiration Heart: HRRR . Normal S1 and S2 without gallops or murmurs.  Abdomen: Non-distended appearing.  Msk: Normal strength and tone for age. Extremities: Warm and well perfused. No clubbing, cyanosis.  No peripheral edema.  Neuro: Alert and oriented X  3. Psych:  Answers questions appropriately.   Labs: Basic Metabolic Panel: Recent Labs    03/30/23 0536 03/31/23 0534  NA 139 139  K 4.1 4.0  CL 102 100  CO2 29 29  GLUCOSE 260* 175*  BUN 13 21*  CREATININE 0.93 1.08*  CALCIUM 8.4* 9.0    Liver Function Tests: Recent Labs    03/30/23 0101  AST 221*  ALT 131*  ALKPHOS 173*  BILITOT 0.5  PROT 7.3  ALBUMIN 3.4*    No results for input(s): "LIPASE", "AMYLASE" in the last 72 hours. CBC: Recent Labs    03/30/23 0101 03/30/23 0536  WBC 10.4 11.0*  NEUTROABS 4.2  --   HGB 13.9 13.4  HCT 42.6 40.2  MCV 91.8 89.9  PLT 403* 339    Cardiac Enzymes: Recent Labs    03/30/23 0101 03/30/23 0313 03/30/23 0939  TROPONINIHS 41* 63* 63*    BNP: Recent Labs    03/30/23 0101  BNP 471.0*    D-Dimer: No results for input(s): "DDIMER" in the last 72 hours. Hemoglobin A1C: No results for input(s): "HGBA1C" in the last 72 hours. Fasting Lipid Panel: No results for input(s): "CHOL", "HDL", "LDLCALC", "TRIG", "CHOLHDL", "LDLDIRECT" in the last 72 hours. Thyroid Function Tests: No results for input(s): "TSH", "T4TOTAL", "T3FREE", "THYROIDAB" in the last 72 hours.  Invalid input(s): "FREET3" Anemia Panel: No results for input(s): "VITAMINB12", "FOLATE", "FERRITIN", "TIBC", "IRON", "RETICCTPCT" in the last 72 hours.   Radiology: ECHOCARDIOGRAM COMPLETE  Result Date: 03/30/2023    ECHOCARDIOGRAM REPORT   Patient Name:    KAMAYAH PILLAY Date of Exam: 03/30/2023 Medical Rec #:  [id number]        Height:       66.5 in Accession #:    [id number]       Weight:       148.1 lb Date of Birth:  08-Nov-1961       BSA:          1.770 m Patient Age:    61 years         BP:           97/72 mmHg Patient Gender: F                HR:           71 bpm. Exam Location:  [facility] Procedure: 2D Echo, Cardiac Doppler and Color Doppler Indications:     CHF- acute diastolic I50.31  History:         Patient has no prior history of Echocardiogram examinations.                  Risk Factors:Hypertension, Diabetes and Dyslipidemia.  Sonographer:     Cristela Blue Referring Phys:  [id number] JAN A MANSY Diagnosing Phys: Marcina Millard MD IMPRESSIONS  1. Left ventricular ejection fraction, by estimation, is 25 to 30%. The left ventricle has severely decreased function. The left ventricle demonstrates regional wall motion abnormalities (see scoring diagram/findings for description). The left ventricular internal cavity size was mildly to moderately dilated. Left ventricular diastolic parameters are consistent with Grade I diastolic dysfunction (impaired relaxation).  2. Right ventricular systolic function is normal. The right ventricular size is normal.  3. The mitral valve is normal in structure. Mild mitral valve regurgitation. No evidence of mitral stenosis.  4. The aortic valve is normal in structure. Aortic valve regurgitation is not visualized. No aortic stenosis is present.  5. The inferior vena cava is normal  in size with greater than 50% respiratory variability, suggesting right atrial pressure of 3 mmHg. FINDINGS  Left Ventricle: Left ventricular ejection fraction, by estimation, is 25 to 30%. The left ventricle has severely decreased function. The left ventricle demonstrates regional wall motion abnormalities. The left ventricular internal cavity size was mildly  to moderately dilated. There is no left ventricular hypertrophy. Left ventricular diastolic  parameters are consistent with Grade I diastolic dysfunction (impaired relaxation). Right Ventricle: The right ventricular size is normal. No increase in right ventricular wall thickness. Right ventricular systolic function is normal. Left Atrium: Left atrial size was normal in size. Right Atrium: Right atrial size was normal in size. Pericardium: There is no evidence of pericardial effusion. Mitral Valve: The mitral valve is normal in structure. Mild mitral valve regurgitation. No evidence of mitral valve stenosis. MV peak gradient, 7.0 mmHg. The mean mitral valve gradient is 3.0 mmHg. Tricuspid Valve: The tricuspid valve is normal in structure. Tricuspid valve regurgitation is mild . No evidence of tricuspid stenosis. Aortic Valve: The aortic valve is normal in structure. Aortic valve regurgitation is not visualized. No aortic stenosis is present. Aortic valve mean gradient measures 2.0 mmHg. Aortic valve peak gradient measures 4.8 mmHg. Aortic valve area, by VTI measures 2.46 cm. Pulmonic Valve: The pulmonic valve was normal in structure. Pulmonic valve regurgitation is not visualized. No evidence of pulmonic stenosis. Aorta: The aortic root is normal in size and structure. Venous: The inferior vena cava is normal in size with greater than 50% respiratory variability, suggesting right atrial pressure of 3 mmHg. IAS/Shunts: No atrial level shunt detected by color flow Doppler.  LEFT VENTRICLE PLAX 2D LVIDd:         5.80 cm      Diastology LVIDs:         5.30 cm      LV e' medial:    4.79 cm/s LV PW:         1.60 cm      LV E/e' medial:  13.0 LV IVS:        1.20 cm      LV e' lateral:   5.33 cm/s LVOT diam:     2.00 cm      LV E/e' lateral: 11.7 LV SV:         36 LV SV Index:   21 LVOT Area:     3.14 cm  LV Volumes (MOD) LV vol d, MOD A2C: 150.0 ml LV vol d, MOD A4C: 141.0 ml LV vol s, MOD A2C: 98.8 ml LV vol s, MOD A4C: 123.0 ml LV SV MOD A2C:     51.2 ml LV SV MOD A4C:     141.0 ml LV SV MOD BP:      32.6 ml  RIGHT VENTRICLE RV Basal diam:  4.60 cm RV Mid diam:    3.40 cm RV S prime:     12.80 cm/s TAPSE (M-mode): 2.7 cm LEFT ATRIUM             Index        RIGHT ATRIUM           Index LA diam:        3.00 cm 1.69 cm/m   RA Area:     16.30 cm LA Vol (A2C):   53.5 ml 30.22 ml/m  RA Volume:   46.20 ml  26.10 ml/m LA Vol (A4C):   63.6 ml 35.93 ml/m LA Biplane Vol: 59.6 ml 33.67 ml/m  AORTIC  VALVE AV Area (Vmax):    2.18 cm AV Area (Vmean):   2.33 cm AV Area (VTI):     2.46 cm AV Vmax:           109.00 cm/s AV Vmean:          70.300 cm/s AV VTI:            0.148 m AV Peak Grad:      4.8 mmHg AV Mean Grad:      2.0 mmHg LVOT Vmax:         75.80 cm/s LVOT Vmean:        52.200 cm/s LVOT VTI:          0.116 m LVOT/AV VTI ratio: 0.78  AORTA Ao Root diam: 2.70 cm MITRAL VALVE                TRICUSPID VALVE MV Area (PHT): 2.92 cm     TR Peak grad:   28.7 mmHg MV Area VTI:   1.62 cm     TR Vmax:        268.00 cm/s MV Peak grad:  7.0 mmHg MV Mean grad:  3.0 mmHg     SHUNTS MV Vmax:       1.32 m/s     Systemic VTI:  0.12 m MV Vmean:      73.6 cm/s    Systemic Diam: 2.00 cm MV Decel Time: 260 msec MV E velocity: 62.40 cm/s MV A velocity: 105.00 cm/s MV E/A ratio:  0.59 Marcina Millard MD Electronically signed by Marcina Millard MD Signature Date/Time: 03/30/2023/1:14:20 PM    Final    DG Chest Port 1 View  Result Date: 03/30/2023 CLINICAL DATA:  Shortness of breath EXAM: PORTABLE CHEST 1 VIEW COMPARISON:  02/20/2023 FINDINGS: Cardiomegaly with mild interstitial edema. Possible trace right pleural effusion. No pneumothorax. IMPRESSION: Cardiomegaly with mild interstitial edema and possible trace right pleural effusion. Electronically Signed   By: Charline Bills M.D.   On: 03/30/2023 01:30    TELEMETRY reviewed by me (LT) 03/31/2023 : Sinus rhythm rate 60s to 80s with occasional premature atrial contractions  EKG reviewed by me: Sinus tachycardia with nonspecific TW abnormality, likely early repol, repeat 4/18  p.m. showing sinus rhythm and T wave inversions in leads II, III and aVF, and leads V4-V6 with incomplete RBBB  Data reviewed by me (LT) 03/31/2023: Hospitalist progress Morrison, last 24h vitals tele labs imaging I/O    Principal Problem:   Acute HFrEF (heart failure with reduced ejection fraction) Active Problems:   Tobacco dependence   Acute respiratory failure with hypoxia   Hypertensive urgency   Uncontrolled type 2 diabetes mellitus with hyperglycemia, with long-term current use of insulin   Shortness of breath   Acute on chronic congestive heart failure   Elevated troponin   Respiratory distress    ASSESSMENT AND PLAN:  Sharissa C. Bulman is a 62yoF with a PMH of insulin dependent DM2 (A1c 10% 03/2023), HTN, current tobacco use who presented to [facility] ED 03/30/2023 with shortness of breath. Found by EMS hypoxic on room air initially requiring BiPAP and was hypertensive to 206/162.  Echo this admission resulted with new reduced ejection fraction with a EF 25-30% with severely reduced LF systolic function  mild-mod LV dilation, g1 DD, mild MR. Clinical improvement after initial diuresis.  Plan for [facility] on Monday 4/22.   # Acute hypoxic respiratory failure # Hypertensive emergency # New HFrEF (25-30%) Presents with sudden onset shortness of  breath and orthopnea, initially hypoxic requiring BiPAP with extremely elevated blood pressures, clinically improved after nitroglycerin infusion and initial IV Lasix.  BNP elevated to 471 and remains with oxygen requirement and basilar crackles on exam.  Has risk factors of uncontrolled diabetes and ongoing tobacco use.  Discussed in detail echo results with the patient and rationale for [facility] on Monday.  Discussed the risks and benefits of R/LHC with the patient in detail and she is agreeable to proceed.  Written consent will be obtained.  N.p.o. at midnight on Sunday 4/21. -Continue diuresis with IV Lasix 40 mg twice daily for now. -Continue losartan 25 mg,  consider entresto pending affordability  -Continue metoprolol XL 25 mg daily -Start low-dose spironolactone 12.5 mg daily -Likely add Farxiga 10 mg daily for discharge - start aspirin  daily  - check lipid panel  -s/p nitroglycerin infusion -Remains stable for transfer out of stepdown to the floor  -will arrange for follow up with Dr. Jose Persia ([facility] heart failure) before discharge for consideration of ICD  # DM2 Poorly controlled, but A1c has improved from >14% 9 months ago, to 10.4 earlier this month.  # Tobacco abuse Currently smokes half pack a day, started Chantix last week.  Discussed smoking cessation in detail, she is motivated to quit entirely.  # Demand ischemia Minimal elevation with a flat trend at 41, 61, 61 which is most consistent with demand/supply mismatch and not ACS   This patient's plan of care was discussed and created with Dr. Juliann Pares and he is in agreement.  Signed: Rebeca Allegra , PA-C 03/31/2023, 9:02 AM [facility]

## 2023-03-31 NOTE — TOC Benefit Eligibility Note (Signed)
Patient Advocate Encounter  Insurance verification completed.    The patient is currently admitted and upon discharge could be taking Jardiance 10 mg.  The current 30 day co-pay is $583.19 due to $4,495 deductible.   The patient is currently admitted and upon discharge could be taking Entresto 24-26 mg.  The current 30 day co-pay is $656.56 due to $4,495 deductible.   The patient is currently admitted and upon discharge could be taking Farxiga 10 mg.  Non Formulary  The patient is insured through [employer]   This test claim was processed through [employer]- copay amounts may vary at other pharmacies due to [employer], or as the patient moves through the different stages of their insurance plan.  Roland Earl, CPHT Pharmacy Patient Advocate Specialist [facility] Patient Advocate Team Direct Number: [phone]  FaxProduct/process development scientist: [phone]

## 2023-03-31 NOTE — Assessment & Plan Note (Signed)
Resolved.  Patient is back on room air Most likely secondary to acute heart failure causing pulmonary edema.  Patient initially required BiPAP.  No baseline oxygen use -Continue to monitor

## 2023-03-31 NOTE — Consult Note (Signed)
   Heart Failure Nurse Navigator Note  HFrEF 25 to 30%.  Regional wall motion abnormality noted.  Grade 1 diastolic dysfunction.  Right ventricular systolic function is normal.  Mild mitral regurgitation.  Mild tricuspid regurgitation.  She presented to the emergency room with complaints of worsening shortness of breath along with chest pressure.  BNP was 472.  Chest x-ray revealed cardiomegaly with mild interstitial edema.   Comorbidities:  Hypertension Hyperlipidemia Type 2 diabetes Anxiety/depression Tobacco abuse  Medications:  Aspirin 81 mg daily Farxiga 10 mg daily Entresto 24/26 mg 2 times a day Metoprolol succinate 25 mg daily Spironolactone 12.5 mg daily Chantix 1 mg 2 times a day  Labs:  Sodium 139, potassium 4, chloride 100, CO2 29, BUN 21, creatinine 1.08, hemoglobin A1c 10.4. Intake 1344 mL Output 1300 mL Weight 67 kg   Initial meeting with patient and was lying in bed in no acute distress currently on room air.  Son was at the bedside.  During the interview patient's daughter was also on the phone.  Discussed heart failure and what it means.  Patient understands from what she was told from her cardiologist that the function of her left ventricle is 25 to 30%, she understands that 55 to 60% is normal.  Goes  on to state that the catheterization on Monday will give answers as to why the change in the function of her heart.  Went over the importance of daily weights, fluid restriction of no more than 64 ounces daily and sticking with sodium intake of no more than 2000 mg a day.  Also went over removing the saltshaker from the table and not using processed foods but eating foods in their natural state.  He voices understanding.  She was given the living with heart failure teaching booklet, zone magnet, info on low-sodium and heart failure along with a weight chart.  Also was given information about the ventricle health program.  She had no further questions and  will continue to follow along.  Tresa Endo RN [facility]

## 2023-03-31 NOTE — Inpatient Diabetes Management (Signed)
Inpatient Diabetes Program Recommendations  AACE/ADA: New Consensus Statement on Inpatient Glycemic Control (2015)  Target Ranges:  Prepandial:   less than 140 mg/dL      Peak postprandial:   less than 180 mg/dL (1-2 hours)      Critically ill patients:  140 - 180 mg/dL   Lab Results  Component Value Date   GLUCAP 160 (H) 03/31/2023   HGBA1C 10.4 (H) 03/22/2023    Review of Glycemic Control  Latest Reference Range & Units 03/30/23 06:08 03/30/23 07:13 03/30/23 11:41 03/30/23 15:36 03/30/23 21:29 03/31/23 07:54  Glucose-Capillary 70 - 99 mg/dL 161 (H) 096 (H) 045 (H) 114 (H) 174 (H) 160 (H)   Diabetes history: DM 2 Outpatient Diabetes medications:  Tresiba 10 units daily Metformin 500 mg bid  Current orders for Inpatient glycemic control:  Novolog 0-15 units tid with meals and HS Semglee 10 units daily  Inpatient Diabetes Program Recommendations:    CBG's mostly within hospital goals.  Note that A1C has improved from >14% to 10.4% (still greater than goal).  May benefit from slight increase in Semglee (basal insulin) to 14 units daily.    Thanks,  Beryl Meager, RN, BC-ADM Inpatient Diabetes Coordinator Pager [phone]  (8a-5p)

## 2023-03-31 NOTE — Progress Notes (Signed)
Progress Note   Patient: Jane Morrison ZOX:[id number] DOB: Aug 09, 1961 DOA: 03/30/2023     1 DOS: the patient was seen and examined on 03/31/2023   Brief hospital course: Taken from H&P.  Jane Morrison is a 62 y.o. African-American female with medical history significant for hypertension, dyslipidemia, type 2 diabetes mellitus, anxiety and depression, who presented to the emergency room with acute onset of worsening dyspnea associated orthopnea and cough productive of clear sputum as well as wheezing that started earlier today.  She admits to chest pressure and denies any fever or chills.   ED course.  On arrival blood pressure was elevated with tachycardia and tachypnea, patient was hypoxic at 66% on room air and initially requiring nonrebreather and BiPAP. Labs pertinent for blood glucose of 310, alkaline phosphatase 173, AST 221, ALT 131, BNP 471.  Troponin 41>> 63.  CBC with thrombocytosis at 403, otherwise unremarkable EKG with sinus tachycardia, PVCs, RSR-in V1  Portable chest x-ray showed cardiomegaly with mild interstitial pulmonary edema and possible trace right pleural effusion.   Patient was given 40 mg of IV Lasix,4 mg of IV morphine sulfate and 4 mg of IV Zofran as well as 1 inch of Nitropaste followed by IV nitroglycerin drip.  Cardiology was also consulted  4/18: Blood pressure improved to 136/90.  Patient was able to wean off from BiPAP and nitro infusion, currently saturating well on 2 L of oxygen.  Labs stable.  Echocardiogram with new diagnosis of HFrEF with EF of 25 to 30%, regional wall motion abnormalities and grade 1 diastolic dysfunction.  Patient will need ischemic workup and further evaluation with cardiac catheterization by cardiology.  4/19: Vital stable.  BMP with slight worsening of creatinine to 1.08-patient is being diuresed, decreasing the dose of Lasix to daily.  Cardiology is planning cath on Monday morning. CBG elevated-increasing the dose of  Semglee.   Assessment and Plan: * Acute HFrEF (heart failure with reduced ejection fraction) Patient with no prior diagnosis of heart failure.  Symptoms are concerning for heart failure and echocardiogram with new onset HFrEF with a EF of 25 to 30%, regional wall motion abnormalities, severely depressed ventricular function and grade 1 diastolic dysfunction. Cardiology is on board-going for cardiac cath on Monday morning -Continue with IV diuresis-decreasing the dose to daily due to increasing creatinine -Daily weight and BMP -Strict intake and output  Acute respiratory failure with hypoxia Resolved.  Patient is back on room air Most likely secondary to acute heart failure causing pulmonary edema.  Patient initially required BiPAP.  No baseline oxygen use -Continue to monitor  Hypertensive urgency Patient presented with pulmonary edema causing acute hypoxic respiratory failure with elevated blood pressure.  Initially placed on nitroglycerin drip. Blood pressure improved and patient is being weaned off from infusion. -Continue metoprolol and losartan -As needed hydralazine  Uncontrolled type 2 diabetes mellitus with hyperglycemia, with long-term current use of insulin Patient has uncontrolled diabetes with hyperglycemia and most recent A1c of 10.4. He was on [place] and metformin at home.  CBG remained elevated -Increasing the dose of Semglee to 10 units twice daily -SSI  Tobacco dependence -Continue home Chantix   Subjective: Patient was seen and examined today.  No chest pain or shortness of breath.  No new complaints.  Physical Exam: Vitals:   03/31/23 0900 03/31/23 1000 03/31/23 1100 03/31/23 1200  BP: (!) 142/81 (!) 119/93 126/80 117/78  Pulse: 94 86 67 70  Resp: (!) 22 (!) 21 (!) 23 20  Temp:  98 F (36.7 C)  TempSrc:    Oral  SpO2: 97% 99% 97% 96%  Weight:      Height:       General.  Well-developed lady, in no acute distress. Pulmonary.  Lungs clear  bilaterally, normal respiratory effort. CV.  Regular rate and rhythm, no JVD, rub or murmur. Abdomen.  Soft, nontender, nondistended, BS positive. CNS.  Alert and oriented .  No focal neurologic deficit. Extremities.  No edema, no cyanosis, pulses intact and symmetrical. Psychiatry.  Judgment and insight appears normal.   Data Reviewed: Prior data reviewed  Family Communication: Discussed with patient  Disposition: Status is: Inpatient Remains inpatient appropriate because: Severity of illness  Planned Discharge Destination: Home  DVT prophylaxis.  Lovenox Time spent: 50 minutes  This record has been created using Conservation officer, historic buildings. Errors have been sought and corrected,but may not always be located. Such creation errors do not reflect on the standard of care.   Author: Arnetha Courser, MD 03/31/2023 2:54 PM  For on call review [email or web address].

## 2023-03-31 NOTE — Assessment & Plan Note (Signed)
Patient presented with pulmonary edema causing acute hypoxic respiratory failure with elevated blood pressure.  Initially placed on nitroglycerin drip. Blood pressure improved and patient is being weaned off from infusion. -Continue metoprolol -Losartan is being switched with Entresto -Spironolactone was added -As needed hydralazine

## 2023-04-01 DIAGNOSIS — E1165 Type 2 diabetes mellitus with hyperglycemia: Secondary | ICD-10-CM | POA: Diagnosis not present

## 2023-04-01 DIAGNOSIS — I160 Hypertensive urgency: Secondary | ICD-10-CM | POA: Diagnosis not present

## 2023-04-01 DIAGNOSIS — R0602 Shortness of breath: Secondary | ICD-10-CM | POA: Diagnosis not present

## 2023-04-01 DIAGNOSIS — I5021 Acute systolic (congestive) heart failure: Secondary | ICD-10-CM | POA: Diagnosis not present

## 2023-04-01 LAB — BASIC METABOLIC PANEL
Anion gap: 8 (ref 5–15)
BUN: 26 mg/dL — ABNORMAL HIGH (ref 6–20)
CO2: 31 mmol/L (ref 22–32)
Calcium: 9.3 mg/dL (ref 8.9–10.3)
Chloride: 99 mmol/L (ref 98–111)
Creatinine, Ser: 1.13 mg/dL — ABNORMAL HIGH (ref 0.44–1.00)
GFR, Estimated: 56 mL/min — ABNORMAL LOW (ref >60)
Glucose, Bld: 141 mg/dL — ABNORMAL HIGH (ref 70–99)
Potassium: 3.8 mmol/L (ref 3.5–5.1)
Sodium: 138 mmol/L (ref 135–145)

## 2023-04-01 LAB — GLUCOSE, CAPILLARY
Glucose-Capillary: 224 mg/dL — ABNORMAL HIGH (ref 70–99)
Glucose-Capillary: 166 mg/dL — ABNORMAL HIGH (ref 70–99)
Glucose-Capillary: 183 mg/dL — ABNORMAL HIGH (ref 70–99)
Glucose-Capillary: 259 mg/dL — ABNORMAL HIGH (ref 70–99)

## 2023-04-01 MED ORDER — INSULIN GLARGINE-YFGN 100 UNIT/ML ~~LOC~~ SOLN
12.0000 [IU] | Freq: Two times a day (BID) | SUBCUTANEOUS | Status: DC
  Administered 2023-04-01 – 2023-04-02 (×3): 12 [IU] via SUBCUTANEOUS
  Filled 2023-04-01 (×5): qty 0.12

## 2023-04-01 NOTE — Progress Notes (Signed)
Progress Note   Patient: Jane Morrison WUJ:[id number] DOB: [birth year] DOA: 03/30/2023     2 DOS: the patient was seen and examined on 04/01/2023   Brief hospital course: Taken from H&P.  JOSLIN DOELL is a 62 y.o. African-American female with medical history significant for hypertension, dyslipidemia, type 2 diabetes mellitus, anxiety and depression, who presented to the emergency room with acute onset of worsening dyspnea associated orthopnea and cough productive of clear sputum as well as wheezing that started earlier today.  She admits to chest pressure and denies any fever or chills.   ED course.  On arrival blood pressure was elevated with tachycardia and tachypnea, patient was hypoxic at 66% on room air and initially requiring nonrebreather and BiPAP. Labs pertinent for blood glucose of 310, alkaline phosphatase 173, AST 221, ALT 131, BNP 471.  Troponin 41>> 63.  CBC with thrombocytosis at 403, otherwise unremarkable EKG with sinus tachycardia, PVCs, RSR-in V1  Portable chest x-ray showed cardiomegaly with mild interstitial pulmonary edema and possible trace right pleural effusion.   Patient was given 40 mg of IV Lasix,4 mg of IV morphine sulfate and 4 mg of IV Zofran as well as 1 inch of Nitropaste followed by IV nitroglycerin drip.  Cardiology was also consulted  4/18: Blood pressure improved to 136/90.  Patient was able to wean off from BiPAP and nitro infusion, currently saturating well on 2 L of oxygen.  Labs stable.  Echocardiogram with new diagnosis of HFrEF with EF of 25 to 30%, regional wall motion abnormalities and grade 1 diastolic dysfunction.  Patient will need ischemic workup and further evaluation with cardiac catheterization by cardiology.  4/19: Vital stable.  BMP with slight worsening of creatinine to 1.08-patient is being diuresed, decreasing the dose of Lasix to daily.  Cardiology is planning cath on Monday morning. CBG elevated-increasing the dose of  Semglee.  4/20: Hemodynamically stable.  Slowly worsening creatinine at 1.13 today.  Clinically appears euvolemic.  Received today's dose of Lasix.  Will hold Lasix tomorrow and encouraging some p.o. hydration.   Assessment and Plan: * Acute HFrEF (heart failure with reduced ejection fraction) Patient with no prior diagnosis of heart failure.  Symptoms are concerning for heart failure and echocardiogram with new onset HFrEF with a EF of 25 to 30%, regional wall motion abnormalities, severely depressed ventricular function and grade 1 diastolic dysfunction. Cardiology is on board-going for cardiac cath on Monday morning -Holding Lasix tomorrow due to increasing creatinine -Cardiology is switching losartan with Entresto -Spironolactone was also added -Daily weight and BMP -Strict intake and output  Acute respiratory failure with hypoxia Resolved.  Patient is back on room air Most likely secondary to acute heart failure causing pulmonary edema.  Patient initially required BiPAP.  No baseline oxygen use -Continue to monitor  Hypertensive urgency Patient presented with pulmonary edema causing acute hypoxic respiratory failure with elevated blood pressure.  Initially placed on nitroglycerin drip. Blood pressure improved and patient is being weaned off from infusion. -Continue metoprolol -Losartan is being switched with Entresto -Spironolactone was added -As needed hydralazine  Uncontrolled type 2 diabetes mellitus with hyperglycemia, with long-term current use of insulin Patient has uncontrolled diabetes with hyperglycemia and most recent A1c of 10.4. She was on [place] and metformin at home.  CBG remained elevated -Increasing the dose of Semglee to 12 units twice daily -SSI  Tobacco dependence -Continue home Chantix   Subjective: Patient denies any chest pain or shortness of breath.  She thinks that she  is getting closer to her baseline now.  Physical Exam: Vitals:   04/01/23  0311 04/01/23 0535 04/01/23 0739 04/01/23 1122  BP: 125/86  123/85 95/71  Pulse: (!) 58  71 76  Resp: Temp: 97.6 F (36.4 C)  97.9 F (36.6 C) 98 F (36.7 C)  TempSrc:      SpO2: 97%  97% 94%  Weight:  63.5 kg    Height:       General.  Well-developed lady, in no acute distress. Pulmonary.  Lungs clear bilaterally, normal respiratory effort. CV.  Regular rate and rhythm, no JVD, rub or murmur. Abdomen.  Soft, nontender, nondistended, BS positive. CNS.  Alert and oriented .  No focal neurologic deficit. Extremities.  No edema, no cyanosis, pulses intact and symmetrical. Psychiatry.  Judgment and insight appears normal.   Data Reviewed: Prior data reviewed  Family Communication: Discussed with patient  Disposition: Status is: Inpatient Remains inpatient appropriate because: Severity of illness  Planned Discharge Destination: Home  DVT prophylaxis.  Lovenox Time spent: 45 minutes  This record has been created using Conservation officer, historic buildings. Errors have been sought and corrected,but may not always be located. Such creation errors do not reflect on the standard of care.   Author: Arnetha Courser, MD 04/01/2023 3:28 PM  For on call review [email or web address].

## 2023-04-01 NOTE — Progress Notes (Signed)
SUBJECTIVE: Patient is feeling much better denies any chest pain or shortness of breath at this time HPI   Vitals:   04/01/23 0039 04/01/23 0311 04/01/23 0535 04/01/23 0739  BP: 122/87 125/86  123/85  Pulse: 73 (!) 58  71  Resp: Temp: 97.6 F (36.4 C) 97.6 F (36.4 C)  97.9 F (36.6 C)  TempSrc:      SpO2: 97% 97%  97%  Weight:   63.5 kg   Height:        Intake/Output Summary (Last 24 hours) at 04/01/2023 1109 Last data filed at 04/01/2023 0900 Gross per 24 hour  Intake 1760 ml  Output 600 ml  Net 1160 ml    LABS: Basic Metabolic Panel: Recent Labs    03/31/23 0534 04/01/23 0441  NA 139 138  K 4.0 3.8  CL 100 99  CO2 29 31  GLUCOSE 175* 141*  BUN 21* 26*  CREATININE 1.08* 1.13*  CALCIUM 9.0 9.3   Liver Function Tests: Recent Labs    03/30/23 0101  AST 221*  ALT 131*  ALKPHOS 173*  BILITOT 0.5  PROT 7.3  ALBUMIN 3.4*   No results for input(s): "LIPASE", "AMYLASE" in the last 72 hours. CBC: Recent Labs    03/30/23 0101 03/30/23 0536  WBC 10.4 11.0*  NEUTROABS 4.2  --   HGB 13.9 13.4  HCT 42.6 40.2  MCV 91.8 89.9  PLT 403* 339   Cardiac Enzymes: No results for input(s): "CKTOTAL", "CKMB", "CKMBINDEX", "TROPONINI" in the last 72 hours. BNP: Invalid input(s): "POCBNP" D-Dimer: No results for input(s): "DDIMER" in the last 72 hours. Hemoglobin A1C: No results for input(s): "HGBA1C" in the last 72 hours. Fasting Lipid Panel: Recent Labs    03/31/23 0534  CHOL 159  HDL 41  LDLCALC 93  TRIG 124  CHOLHDL 3.9   Thyroid Function Tests: No results for input(s): "TSH", "T4TOTAL", "T3FREE", "THYROIDAB" in the last 72 hours.  Invalid input(s): "FREET3" Anemia Panel: No results for input(s): "VITAMINB12", "FOLATE", "FERRITIN", "TIBC", "IRON", "RETICCTPCT" in the last 72 hours.   PHYSICAL EXAM General: Well developed, well nourished, in no acute distress HEENT:  Normocephalic and atramatic Neck:  No JVD.  Lungs: Clear bilaterally  to auscultation and percussion. Heart: HRRR . Normal S1 and S2 without gallops or murmurs.  Abdomen: Bowel sounds are positive, abdomen soft and non-tender  Msk:  Back normal, normal gait. Normal strength and tone for age. Extremities: No clubbing, cyanosis or edema.   Neuro: Alert and oriented X 3. Psych:  Good affect, responds appropriately  TELEMETRY: Sinus rhythm  ASSESSMENT AND PLAN: Congestive heart failure due to HFrEF admitted with severe shortness of breath hypoxia and respiratory failure with significant improvement with diuretics and now Entresto metoprolol succinate and Aldactone.  Patient is feeling much better today.  We switched losartan to [facility] as pharmacy called and said that it was approved by the insurance.  Patient is having left and right heart catheterization for evaluation of severe LV dysfunction 25 to 30% on Monday.  Patient has agreed to the procedure.   ICD-10-CM   1. Shortness of breath  R06.02     2. Hypoxia  R09.02     3. Respiratory distress  R06.03     4. Acute on chronic congestive heart failure, unspecified heart failure type  I50.9     5. Hypertensive urgency  I16.0     6. Elevated troponin  R79.89  Principal Problem:   Acute HFrEF (heart failure with reduced ejection fraction) Active Problems:   Tobacco dependence   Acute respiratory failure with hypoxia   Hypertensive urgency   Uncontrolled type 2 diabetes mellitus with hyperglycemia, with long-term current use of insulin   Shortness of breath   Acute on chronic congestive heart failure   Elevated troponin   Respiratory distress    Jane Blackwater, MD, [facility] 04/01/2023 11:09 AM

## 2023-04-02 DIAGNOSIS — R0602 Shortness of breath: Secondary | ICD-10-CM | POA: Diagnosis not present

## 2023-04-02 DIAGNOSIS — I5021 Acute systolic (congestive) heart failure: Secondary | ICD-10-CM | POA: Diagnosis not present

## 2023-04-02 DIAGNOSIS — I160 Hypertensive urgency: Secondary | ICD-10-CM | POA: Diagnosis not present

## 2023-04-02 DIAGNOSIS — E1165 Type 2 diabetes mellitus with hyperglycemia: Secondary | ICD-10-CM | POA: Diagnosis not present

## 2023-04-02 LAB — BASIC METABOLIC PANEL
Anion gap: 10 (ref 5–15)
BUN: 24 mg/dL — ABNORMAL HIGH (ref 6–20)
CO2: 26 mmol/L (ref 22–32)
Calcium: 9.1 mg/dL (ref 8.9–10.3)
Chloride: 100 mmol/L (ref 98–111)
Creatinine, Ser: 1.02 mg/dL — ABNORMAL HIGH (ref 0.44–1.00)
GFR, Estimated: >60 mL/min (ref >60)
Glucose, Bld: 144 mg/dL — ABNORMAL HIGH (ref 70–99)
Potassium: 3.8 mmol/L (ref 3.5–5.1)
Sodium: 136 mmol/L (ref 135–145)

## 2023-04-02 LAB — GLUCOSE, CAPILLARY
Glucose-Capillary: 152 mg/dL — ABNORMAL HIGH (ref 70–99)
Glucose-Capillary: 292 mg/dL — ABNORMAL HIGH (ref 70–99)
Glucose-Capillary: 215 mg/dL — ABNORMAL HIGH (ref 70–99)
Glucose-Capillary: 209 mg/dL — ABNORMAL HIGH (ref 70–99)

## 2023-04-02 MED ORDER — ASPIRIN 81 MG PO CHEW
81.0000 mg | CHEWABLE_TABLET | ORAL | Status: AC
  Administered 2023-04-03: 81 mg via ORAL
  Filled 2023-04-02: qty 1

## 2023-04-02 MED ORDER — SODIUM CHLORIDE 0.9 % IV SOLN: 250.0000 mL | INTRAVENOUS | Status: DC | PRN

## 2023-04-02 MED ORDER — SODIUM CHLORIDE 0.9% FLUSH
3.0000 mL | Freq: Two times a day (BID) | INTRAVENOUS | Status: DC
  Administered 2023-04-02 (×2): 3 mL via INTRAVENOUS

## 2023-04-02 MED ORDER — SODIUM CHLORIDE 0.9 % IV SOLN: INTRAVENOUS | Status: DC

## 2023-04-02 MED ORDER — SODIUM CHLORIDE 0.9% FLUSH: 3.0000 mL | INTRAVENOUS | Status: DC | PRN

## 2023-04-02 NOTE — Progress Notes (Signed)
SUBJECTIVE: Jane Morrison is a 62 y.o. African-American female with medical history significant for hypertension, dyslipidemia, type 2 diabetes mellitus, anxiety and depression, who presented to the emergency room with acute onset of worsening dyspnea associated orthopnea and cough productive of clear sputum as well as wheezing that started on 03/30/23.   Vitals:   04/01/23 2331 04/02/23 0325 04/02/23 0752 04/02/23 1201  BP: 101/89 99/75 90/78  118/81  Pulse: 72 73 71 73  Resp: Temp: 97.9 F (36.6 C) 97.6 F (36.4 C) 98.2 F (36.8 C) 98.1 F (36.7 C)  TempSrc: Oral Oral    SpO2: 97% 99% 93% 98%  Weight:  63.9 kg    Height:        Intake/Output Summary (Last 24 hours) at 04/02/2023 1522 Last data filed at 04/01/2023 1949 Gross per 24 hour  Intake 240 ml  Output --  Net 240 ml    LABS: Basic Metabolic Panel: Recent Labs    04/01/23 0441 04/02/23 0604  NA 138 136  K 3.8 3.8  CL 99 100  CO2 31 26  GLUCOSE 141* 144*  BUN 26* 24*  CREATININE 1.13* 1.02*  CALCIUM 9.3 9.1   Liver Function Tests: No results for input(s): "AST", "ALT", "ALKPHOS", "BILITOT", "PROT", "ALBUMIN" in the last 72 hours. No results for input(s): "LIPASE", "AMYLASE" in the last 72 hours. CBC: No results for input(s): "WBC", "NEUTROABS", "HGB", "HCT", "MCV", "PLT" in the last 72 hours. Cardiac Enzymes: No results for input(s): "CKTOTAL", "CKMB", "CKMBINDEX", "TROPONINI" in the last 72 hours. BNP: Invalid input(s): "POCBNP" D-Dimer: No results for input(s): "DDIMER" in the last 72 hours. Hemoglobin A1C: No results for input(s): "HGBA1C" in the last 72 hours. Fasting Lipid Panel: Recent Labs    03/31/23 0534  CHOL 159  HDL 41  LDLCALC 93  TRIG 124  CHOLHDL 3.9   Thyroid Function Tests: No results for input(s): "TSH", "T4TOTAL", "T3FREE", "THYROIDAB" in the last 72 hours.  Invalid input(s): "FREET3" Anemia Panel: No results for input(s): "VITAMINB12", "FOLATE", "FERRITIN",  "TIBC", "IRON", "RETICCTPCT" in the last 72 hours.   PHYSICAL EXAM General: Well developed, well nourished, in no acute distress HEENT:  Normocephalic and atramatic Neck:  No JVD.  Lungs: Clear bilaterally to auscultation and percussion. Heart: HRRR . Normal S1 and S2 without gallops or murmurs.  Abdomen: Bowel sounds are positive, abdomen soft and non-tender  Msk:  Back normal, normal gait. Normal strength and tone for age. Extremities: No clubbing, cyanosis or edema.   Neuro: Alert and oriented X 3. Psych:  Good affect, responds appropriately  TELEMETRY: sinus rhythm, HR 74 bpm  ASSESSMENT AND PLAN: Patient resting comfortably in bed. Denies chest pain, shortness of breath. Congestive heart failure due to HFrEF admitted with severe shortness of breath hypoxia and respiratory failure with significant improvement with diuretics and now Entresto metoprolol succinate and Aldactone. Patient is feeling much better today. Patient is having left and right heart catheterization for evaluation of severe LV dysfunction 25 to 30% on Monday. Patient has agreed to the procedure.     ICD-10-CM   1. Shortness of breath  R06.02     2. Hypoxia  R09.02     3. Respiratory distress  R06.03     4. Acute on chronic congestive heart failure, unspecified heart failure type  I50.9     5. Hypertensive urgency  I16.0     6. Elevated troponin  R79.89       Principal Problem:  Acute HFrEF (heart failure with reduced ejection fraction) Active Problems:   Tobacco dependence   Acute respiratory failure with hypoxia   Hypertensive urgency   Uncontrolled type 2 diabetes mellitus with hyperglycemia, with long-term current use of insulin   Shortness of breath   Acute on chronic congestive heart failure   Elevated troponin   Respiratory distress    Luciana Cammarata, FNP-C 04/02/2023 3:22 PM

## 2023-04-02 NOTE — Progress Notes (Signed)
Progress Note   Patient: Jane Morrison NFA:[id number] DOB: 28-Jul-1961 DOA: 03/30/2023     3 DOS: the patient was seen and examined on 04/02/2023   Brief hospital course: Taken from H&P.  JAQUELINNE GLENDENING is a 62 y.o. African-American female with medical history significant for hypertension, dyslipidemia, type 2 diabetes mellitus, anxiety and depression, who presented to the emergency room with acute onset of worsening dyspnea associated orthopnea and cough productive of clear sputum as well as wheezing that started earlier today.  She admits to chest pressure and denies any fever or chills.   ED course.  On arrival blood pressure was elevated with tachycardia and tachypnea, patient was hypoxic at 66% on room air and initially requiring nonrebreather and BiPAP. Labs pertinent for blood glucose of 310, alkaline phosphatase 173, AST 221, ALT 131, BNP 471.  Troponin 41>> 63.  CBC with thrombocytosis at 403, otherwise unremarkable EKG with sinus tachycardia, PVCs, RSR-in V1  Portable chest x-ray showed cardiomegaly with mild interstitial pulmonary edema and possible trace right pleural effusion.   Patient was given 40 mg of IV Lasix,4 mg of IV morphine sulfate and 4 mg of IV Zofran as well as 1 inch of Nitropaste followed by IV nitroglycerin drip.  Cardiology was also consulted  4/18: Blood pressure improved to 136/90.  Patient was able to wean off from BiPAP and nitro infusion, currently saturating well on 2 L of oxygen.  Labs stable.  Echocardiogram with new diagnosis of HFrEF with EF of 25 to 30%, regional wall motion abnormalities and grade 1 diastolic dysfunction.  Patient will need ischemic workup and further evaluation with cardiac catheterization by cardiology.  4/19: Vital stable.  BMP with slight worsening of creatinine to 1.08-patient is being diuresed, decreasing the dose of Lasix to daily.  Cardiology is planning cath on Monday morning. CBG elevated-increasing the dose of  Semglee.  4/20: Hemodynamically stable.  Slowly worsening creatinine at 1.13 today.  Clinically appears euvolemic.  Received today's dose of Lasix.  Will hold Lasix tomorrow and encouraging some p.o. hydration.  4/21: Hemodynamically stable.  Creatinine started improving after holding Lasix.  Clinically appears euvolemic.  Cardiac cath tomorrow morning   Assessment and Plan: * Acute HFrEF (heart failure with reduced ejection fraction) Patient with no prior diagnosis of heart failure.  Symptoms are concerning for heart failure and echocardiogram with new onset HFrEF with a EF of 25 to 30%, regional wall motion abnormalities, severely depressed ventricular function and grade 1 diastolic dysfunction. Cardiology is on board-going for cardiac cath on Monday morning -Keep holding Lasix -Cardiology is switching losartan with Entresto -Spironolactone was also added -Daily weight and BMP -Strict intake and output  Acute respiratory failure with hypoxia Resolved.  Patient is back on room air Most likely secondary to acute heart failure causing pulmonary edema.  Patient initially required BiPAP.  No baseline oxygen use -Continue to monitor  Hypertensive urgency Patient presented with pulmonary edema causing acute hypoxic respiratory failure with elevated blood pressure.  Initially placed on nitroglycerin drip. Blood pressure improved and patient is being weaned off from infusion. -Continue metoprolol -Losartan is being switched with Entresto -Spironolactone was added -As needed hydralazine  Uncontrolled type 2 diabetes mellitus with hyperglycemia, with long-term current use of insulin Patient has uncontrolled diabetes with hyperglycemia and most recent A1c of 10.4. She was on [place] and metformin at home.  CBG remained elevated -Increasing the dose of Semglee to 12 units twice daily -SSI  Tobacco dependence -Continue home Chantix  Subjective: Patient was seen and examined today.  No  new complaints.  Physical Exam: Vitals:   04/01/23 2331 04/02/23 0325 04/02/23 0752 04/02/23 1201  BP: 101/89 99/75 90/78  118/81  Pulse: 72 73 71 73  Resp: Temp: 97.9 F (36.6 C) 97.6 F (36.4 C) 98.2 F (36.8 C) 98.1 F (36.7 C)  TempSrc: Oral Oral    SpO2: 97% 99% 93% 98%  Weight:  63.9 kg    Height:       General.  Well-developed lady, in no acute distress. Pulmonary.  Lungs clear bilaterally, normal respiratory effort. CV.  Regular rate and rhythm, no JVD, rub or murmur. Abdomen.  Soft, nontender, nondistended, BS positive. CNS.  Alert and oriented .  No focal neurologic deficit. Extremities.  No edema, no cyanosis, pulses intact and symmetrical. Psychiatry.  Judgment and insight appears normal.   Data Reviewed: Prior data reviewed  Family Communication: Discussed with patient  Disposition: Status is: Inpatient Remains inpatient appropriate because: Severity of illness  Planned Discharge Destination: Home  DVT prophylaxis.  Lovenox Time spent: 40 minutes  This record has been created using Conservation officer, historic buildings. Errors have been sought and corrected,but may not always be located. Such creation errors do not reflect on the standard of care.   Author: Arnetha Courser, MD 04/02/2023 3:09 PM  For on call review [email or web address].

## 2023-04-03 ENCOUNTER — Encounter: Payer: Self-pay | Admitting: Cardiology

## 2023-04-03 ENCOUNTER — Encounter
Admission: EM | Disposition: A | Discharge status: Home / Self Care | Payer: Self-pay | Source: Home / Self Care | Attending: Internal Medicine

## 2023-04-03 DIAGNOSIS — I5022 Chronic systolic (congestive) heart failure: Secondary | ICD-10-CM | POA: Diagnosis not present

## 2023-04-03 DIAGNOSIS — J9601 Acute respiratory failure with hypoxia: Secondary | ICD-10-CM | POA: Diagnosis not present

## 2023-04-03 DIAGNOSIS — F17200 Nicotine dependence, unspecified, uncomplicated: Secondary | ICD-10-CM

## 2023-04-03 DIAGNOSIS — I2510 Atherosclerotic heart disease of native coronary artery without angina pectoris: Secondary | ICD-10-CM | POA: Diagnosis not present

## 2023-04-03 DIAGNOSIS — R7989 Other specified abnormal findings of blood chemistry: Secondary | ICD-10-CM | POA: Diagnosis not present

## 2023-04-03 DIAGNOSIS — F17210 Nicotine dependence, cigarettes, uncomplicated: Secondary | ICD-10-CM | POA: Diagnosis not present

## 2023-04-03 DIAGNOSIS — E119 Type 2 diabetes mellitus without complications: Secondary | ICD-10-CM | POA: Diagnosis not present

## 2023-04-03 DIAGNOSIS — I160 Hypertensive urgency: Secondary | ICD-10-CM | POA: Diagnosis not present

## 2023-04-03 DIAGNOSIS — R0602 Shortness of breath: Secondary | ICD-10-CM | POA: Diagnosis not present

## 2023-04-03 DIAGNOSIS — I5021 Acute systolic (congestive) heart failure: Secondary | ICD-10-CM | POA: Diagnosis not present

## 2023-04-03 HISTORY — PX: RIGHT/LEFT HEART CATH AND CORONARY ANGIOGRAPHY: CATH118266

## 2023-04-03 LAB — POCT I-STAT EG7
Acid-Base Excess: 0 mmol/L (ref 0.0–2.0)
Bicarbonate: 25.6 mmol/L (ref 20.0–28.0)
Calcium, Ion: 1.09 mmol/L — ABNORMAL LOW (ref 1.15–1.40)
HCT: 41 % (ref 36.0–46.0)
Hemoglobin: 13.9 g/dL (ref 12.0–15.0)
O2 Saturation: 67 %
Potassium: 3.5 mmol/L (ref 3.5–5.1)
Sodium: 141 mmol/L (ref 135–145)
TCO2: 27 mmol/L (ref 22–32)
pCO2, Ven: 44.5 mmHg (ref 44–60)
pH, Ven: 7.368 (ref 7.25–7.43)
pO2, Ven: 36 mmHg (ref 32–45)

## 2023-04-03 LAB — BASIC METABOLIC PANEL
Anion gap: 8 (ref 5–15)
BUN: 26 mg/dL — ABNORMAL HIGH (ref 6–20)
CO2: 27 mmol/L (ref 22–32)
Calcium: 8.9 mg/dL (ref 8.9–10.3)
Chloride: 103 mmol/L (ref 98–111)
Creatinine, Ser: 1.1 mg/dL — ABNORMAL HIGH (ref 0.44–1.00)
GFR, Estimated: 58 mL/min — ABNORMAL LOW (ref >60)
Glucose, Bld: 136 mg/dL — ABNORMAL HIGH (ref 70–99)
Potassium: 3.9 mmol/L (ref 3.5–5.1)
Sodium: 138 mmol/L (ref 135–145)

## 2023-04-03 LAB — POCT I-STAT 7, (LYTES, BLD GAS, ICA,H+H)
Acid-Base Excess: 0 mmol/L (ref 0.0–2.0)
Bicarbonate: 25.4 mmol/L (ref 20.0–28.0)
Calcium, Ion: 1.22 mmol/L (ref 1.15–1.40)
HCT: 43 % (ref 36.0–46.0)
Hemoglobin: 14.6 g/dL (ref 12.0–15.0)
O2 Saturation: 91 %
Potassium: 3.8 mmol/L (ref 3.5–5.1)
Sodium: 139 mmol/L (ref 135–145)
TCO2: 27 mmol/L (ref 22–32)
pCO2 arterial: 42 mmHg (ref 32–48)
pH, Arterial: 7.389 (ref 7.35–7.45)
pO2, Arterial: 62 mmHg — ABNORMAL LOW (ref 83–108)

## 2023-04-03 LAB — CBC
HCT: 44.3 % (ref 36.0–46.0)
Hemoglobin: 14.9 g/dL (ref 12.0–15.0)
MCH: 30 pg (ref 26.0–34.0)
MCHC: 33.6 g/dL (ref 30.0–36.0)
MCV: 89.3 fL (ref 80.0–100.0)
Platelets: 396 10*3/uL (ref 150–400)
RBC: 4.96 MIL/uL (ref 3.87–5.11)
RDW: 13.1 % (ref 11.5–15.5)
WBC: 5 10*3/uL (ref 4.0–10.5)
nRBC: 0 % (ref 0.0–0.2)

## 2023-04-03 LAB — GLUCOSE, CAPILLARY: Glucose-Capillary: 141 mg/dL — ABNORMAL HIGH (ref 70–99)

## 2023-04-03 SURGERY — RIGHT/LEFT HEART CATH AND CORONARY ANGIOGRAPHY
Anesthesia: Moderate Sedation

## 2023-04-03 MED ORDER — DAPAGLIFLOZIN PROPANEDIOL 10 MG PO TABS
10.0000 mg | ORAL_TABLET | Freq: Every day | ORAL | 2 refills | Status: DC
  Filled 2023-04-06 (×3): qty 30, 30d supply, fill #0

## 2023-04-03 MED ORDER — SODIUM CHLORIDE 0.9 % IV SOLN: 250.0000 mL | INTRAVENOUS | Status: DC | PRN

## 2023-04-03 MED ORDER — HEPARIN SODIUM (PORCINE) 1000 UNIT/ML IJ SOLN
INTRAMUSCULAR | Status: DC | PRN
  Administered 2023-04-03: 3500 [IU] via INTRAVENOUS

## 2023-04-03 MED ORDER — SODIUM CHLORIDE 0.9% FLUSH: 3.0000 mL | Freq: Two times a day (BID) | INTRAVENOUS | Status: DC

## 2023-04-03 MED ORDER — METOPROLOL SUCCINATE ER 25 MG PO TB24: 25.0000 mg | ORAL_TABLET | Freq: Every day | ORAL | 2 refills | Status: DC

## 2023-04-03 MED ORDER — VERAPAMIL HCL 2.5 MG/ML IV SOLN
INTRAVENOUS | Status: AC
  Filled 2023-04-03: qty 2

## 2023-04-03 MED ORDER — IOHEXOL 300 MG/ML  SOLN
INTRAMUSCULAR | Status: DC | PRN
  Administered 2023-04-03: 88 mL

## 2023-04-03 MED ORDER — FENTANYL CITRATE (PF) 100 MCG/2ML IJ SOLN
INTRAMUSCULAR | Status: DC | PRN
  Administered 2023-04-03: 25 ug via INTRAVENOUS

## 2023-04-03 MED ORDER — LIDOCAINE HCL (PF) 1 % IJ SOLN
INTRAMUSCULAR | Status: DC | PRN
  Administered 2023-04-03: 6 mL

## 2023-04-03 MED ORDER — HEPARIN (PORCINE) IN NACL 1000-0.9 UT/500ML-% IV SOLN
INTRAVENOUS | Status: AC
  Filled 2023-04-03: qty 500

## 2023-04-03 MED ORDER — HEPARIN (PORCINE) IN NACL 1000-0.9 UT/500ML-% IV SOLN
INTRAVENOUS | Status: AC
  Filled 2023-04-03: qty 1000

## 2023-04-03 MED ORDER — MIDAZOLAM HCL 2 MG/2ML IJ SOLN
INTRAMUSCULAR | Status: AC
  Filled 2023-04-03: qty 2

## 2023-04-03 MED ORDER — ASPIRIN 81 MG PO CHEW: 81.0000 mg | CHEWABLE_TABLET | Freq: Every day | ORAL | 0 refills | Status: AC

## 2023-04-03 MED ORDER — HYDRALAZINE HCL 20 MG/ML IJ SOLN: 10.0000 mg | INTRAMUSCULAR | Status: DC | PRN

## 2023-04-03 MED ORDER — FENTANYL CITRATE (PF) 100 MCG/2ML IJ SOLN
INTRAMUSCULAR | Status: AC
  Filled 2023-04-03: qty 2

## 2023-04-03 MED ORDER — ACETAMINOPHEN 325 MG PO TABS: 650.0000 mg | ORAL_TABLET | ORAL | Status: DC | PRN

## 2023-04-03 MED ORDER — SODIUM CHLORIDE 0.9% FLUSH: 3.0000 mL | INTRAVENOUS | Status: DC | PRN

## 2023-04-03 MED ORDER — LIDOCAINE HCL 1 % IJ SOLN
INTRAMUSCULAR | Status: AC
  Filled 2023-04-03: qty 20

## 2023-04-03 MED ORDER — LABETALOL HCL 5 MG/ML IV SOLN: 10.0000 mg | INTRAVENOUS | Status: DC | PRN

## 2023-04-03 MED ORDER — SPIRONOLACTONE 25 MG PO TABS: 12.5000 mg | ORAL_TABLET | Freq: Every day | ORAL | 1 refills | Status: DC

## 2023-04-03 MED ORDER — VERAPAMIL HCL 2.5 MG/ML IV SOLN
INTRAVENOUS | Status: DC | PRN
  Administered 2023-04-03: 2.5 mg via INTRA_ARTERIAL

## 2023-04-03 MED ORDER — SODIUM CHLORIDE 0.9 % WEIGHT BASED INFUSION
1.0000 mL/kg/h | INTRAVENOUS | Status: DC
  Administered 2023-04-03: 1 mL/kg/h via INTRAVENOUS

## 2023-04-03 MED ORDER — ONDANSETRON HCL 4 MG/2ML IJ SOLN: 4.0000 mg | Freq: Four times a day (QID) | INTRAMUSCULAR | Status: DC | PRN

## 2023-04-03 MED ORDER — SACUBITRIL-VALSARTAN 24-26 MG PO TABS
1.0000 | ORAL_TABLET | Freq: Two times a day (BID) | ORAL | 1 refills | Status: DC
  Filled 2023-04-06: qty 60, 30d supply, fill #0

## 2023-04-03 MED ORDER — MIDAZOLAM HCL 2 MG/2ML IJ SOLN
INTRAMUSCULAR | Status: DC | PRN
  Administered 2023-04-03: 1 mg via INTRAVENOUS

## 2023-04-03 MED ORDER — HEPARIN SODIUM (PORCINE) 1000 UNIT/ML IJ SOLN
INTRAMUSCULAR | Status: AC
  Filled 2023-04-03: qty 10

## 2023-04-03 MED ORDER — HEPARIN (PORCINE) IN NACL 1000-0.9 UT/500ML-% IV SOLN
INTRAVENOUS | Status: DC | PRN
  Administered 2023-04-03 (×2): 500 mL

## 2023-04-03 SURGICAL SUPPLY — 14 items
CATH 5FR JL3.5 JR4 ANG PIG MP (CATHETERS) IMPLANT
CATH BALLN WEDGE 5F 110CM (CATHETERS) IMPLANT
DEVICE RAD TR BAND REGULAR (VASCULAR PRODUCTS) IMPLANT
DRAPE BRACHIAL (DRAPES) IMPLANT
DRAPE FEMORAL ANGIO W/ POUCH (DRAPES) IMPLANT
GLIDESHEATH SLEND SS 6F .021 (SHEATH) IMPLANT
GUIDEWIRE INQWIRE 1.5J.035X260 (WIRE) IMPLANT
INQWIRE 1.5J .035X260CM (WIRE) ×1
KIT SYRINGE INJ CVI SPIKEX1 (MISCELLANEOUS) IMPLANT
PACK CARDIAC CATH (CUSTOM PROCEDURE TRAY) ×1 IMPLANT
PROTECTION STATION PRESSURIZED (MISCELLANEOUS) ×1
SET ATX-X65L (MISCELLANEOUS) IMPLANT
SHEATH GLIDE SLENDER 4/5FR (SHEATH) IMPLANT
STATION PROTECTION PRESSURIZED (MISCELLANEOUS) IMPLANT

## 2023-04-03 NOTE — Discharge Summary (Signed)
Physician Discharge Summary   Patient: Jane Morrison MRN: [id number] DOB: 1961/11/02  Admit date:     03/30/2023  Discharge date: 04/03/23  Discharge Physician: Arnetha Courser   PCP: Berniece Salines, FNP   Recommendations at discharge:  Please obtain CBC and BMP in 1 week Follow-up with cardiology Follow-up with primary care provider  Discharge Diagnoses: Principal Problem:   Acute HFrEF (heart failure with reduced ejection fraction) Active Problems:   Acute respiratory failure with hypoxia   Hypertensive urgency   Uncontrolled type 2 diabetes mellitus with hyperglycemia, with long-term current use of insulin   Tobacco dependence   Shortness of breath   Acute on chronic congestive heart failure   Elevated troponin   Respiratory distress   Hospital Course: Taken from H&P.  Jane Morrison is a 62 y.o. African-American female with medical history significant for hypertension, dyslipidemia, type 2 diabetes mellitus, anxiety and depression, who presented to the emergency room with acute onset of worsening dyspnea associated orthopnea and cough productive of clear sputum as well as wheezing that started earlier today.  She admits to chest pressure and denies any fever or chills.   ED course.  On arrival blood pressure was elevated with tachycardia and tachypnea, patient was hypoxic at 66% on room air and initially requiring nonrebreather and BiPAP. Labs pertinent for blood glucose of 310, alkaline phosphatase 173, AST 221, ALT 131, BNP 471.  Troponin 41>> 63.  CBC with thrombocytosis at 403, otherwise unremarkable EKG with sinus tachycardia, PVCs, RSR-in V1  Portable chest x-ray showed cardiomegaly with mild interstitial pulmonary edema and possible trace right pleural effusion.   Patient was given 40 mg of IV Lasix,4 mg of IV morphine sulfate and 4 mg of IV Zofran as well as 1 inch of Nitropaste followed by IV nitroglycerin drip.  Cardiology was also consulted  4/18: Blood  pressure improved to 136/90.  Patient was able to wean off from BiPAP and nitro infusion, currently saturating well on 2 L of oxygen.  Labs stable.  Echocardiogram with new diagnosis of HFrEF with EF of 25 to 30%, regional wall motion abnormalities and grade 1 diastolic dysfunction.  Patient will need ischemic workup and further evaluation with cardiac catheterization by cardiology.  4/19: Vital stable.  BMP with slight worsening of creatinine to 1.08-patient is being diuresed, decreasing the dose of Lasix to daily.  Cardiology is planning cath on Monday morning. CBG elevated-increasing the dose of Semglee.  4/20: Hemodynamically stable.  Slowly worsening creatinine at 1.13 today.  Clinically appears euvolemic.  Received today's dose of Lasix.  Will hold Lasix tomorrow and encouraging some p.o. hydration.  4/21: Hemodynamically stable.  Creatinine started improving after holding Lasix.  Clinically appears euvolemic.  Cardiac cath tomorrow morning.  4/22: Remained stable.  Had her cardiac catheterization today which shows 70% stenosis of mid RCA and 50% distal RCA.  EF was 35 to 45% by visual estimate.  No intervention was done and decided to proceed with medical management.  Right heart pressures were low also patient was euvolemic.  Cardiology cleared her for discharge on Entresto, spironolactone and Farxiga.  We did not added any diuretic or Lasix at this time as he appears euvolemic to dry.  Cardiology can add later on as needed.  Patient will continue on current medications and need to have a close follow-up with her providers for further recommendations.  Assessment and Plan: * Acute HFrEF (heart failure with reduced ejection fraction) Patient with no prior diagnosis of  heart failure.  Symptoms are concerning for heart failure and echocardiogram with new onset HFrEF with a EF of 25 to 30%, regional wall motion abnormalities, severely depressed ventricular function and grade 1 diastolic  dysfunction. Cardiology is on board-going for cardiac cath on Monday morning -Keep holding Lasix -Cardiology is switching losartan with Entresto -Spironolactone was also added -Daily weight and BMP -Strict intake and output  Acute respiratory failure with hypoxia Resolved.  Patient is back on room air Most likely secondary to acute heart failure causing pulmonary edema.  Patient initially required BiPAP.  No baseline oxygen use -Continue to monitor  Hypertensive urgency Patient presented with pulmonary edema causing acute hypoxic respiratory failure with elevated blood pressure.  Initially placed on nitroglycerin drip. Blood pressure improved and patient is being weaned off from infusion. -Continue metoprolol -Losartan is being switched with Entresto -Spironolactone was added -As needed hydralazine  Uncontrolled type 2 diabetes mellitus with hyperglycemia, with long-term current use of insulin Patient has uncontrolled diabetes with hyperglycemia and most recent A1c of 10.4. She was on [place] and metformin at home.  CBG remained elevated -Increasing the dose of Semglee to 12 units twice daily -SSI  Tobacco dependence -Continue home Chantix   Consultants: Cardiology Procedures performed: Left heart catheterization Disposition: Home Diet recommendation:  Discharge Diet Orders (From admission, onward)     Start     Ordered   04/03/23 0000  Diet - low sodium heart healthy        04/03/23 1122           Cardiac and Carb modified diet DISCHARGE MEDICATION: Allergies as of 04/03/2023   No Known Allergies      Medication List     STOP taking these medications    losartan 25 MG tablet Commonly known as: COZAAR       TAKE these medications    Accu-Chek Softclix Lancets lancets Use as instructed   aspirin 81 MG chewable tablet Chew 1 tablet (81 mg total) by mouth daily. Start taking on: April 04, 2023   BD Pen Needle Micro U/F 32G X 6 MM Misc Generic  drug: Insulin Pen Needle Use to administer insulin   blood glucose meter kit and supplies Kit Dispense based on patient and insurance preference. Use up to four times daily as directed. (FOR ICD-9 250.00, 250.01).   BLOOD GLUCOSE TEST STRIPS Strp Use as directed to monitor FSBS once daily for DM   dapagliflozin propanediol 10 MG Tabs tablet Commonly known as: FARXIGA Take 1 tablet (10 mg total) by mouth daily. Start taking on: April 04, 2023   Gvoke HypoPen 1-Pack 0.5 MG/0.1ML Soaj Generic drug: Glucagon Inject 0.5 mg into the skin once as needed for up to 1 dose (for hypoglycemia).   metFORMIN 500 MG tablet Commonly known as: GLUCOPHAGE Take 1 tablet (500 mg total) by mouth 2 (two) times daily with a meal.   metoprolol succinate 25 MG 24 hr tablet Commonly known as: TOPROL-XL Take 1 tablet (25 mg total) by mouth daily. Start taking on: April 04, 2023   sacubitril-valsartan 24-26 MG Commonly known as: ENTRESTO Take 1 tablet by mouth 2 (two) times daily.   spironolactone 25 MG tablet Commonly known as: ALDACTONE Take 0.5 tablets (12.5 mg total) by mouth daily. Start taking on: April 04, 2023   Evaristo Bury FlexTouch 100 UNIT/ML FlexTouch Pen Generic drug: insulin degludec Inject 10 Units into the skin daily.   Varenicline Tartrate (Starter) 0.5 MG X 11 & 1 MG X 42  Tbpk Commonly known as: Chantix Starting Month Pak Take 0.5 mg tablet by mouth 1x daily for 3 days, then increase to 0.5 mg tablet 2x daily for 4 days, then increase to 1 mg tablet 2x daily.        Follow-up Information     Paraschos, Alexander, MD. Go in 1 week(s).   Specialty: Cardiology Contact information: [address] [facility] [place] [address] [phone]         Berniece Salines, FNP. Schedule an appointment as soon as possible for a visit in 1 week(s).   Specialty: Nurse Practitioner Contact information: [address] [place]  [address] [phone]                Discharge Exam: Ceasar Mons Weights   04/01/23 0535 04/02/23 0325 04/03/23 0306  Weight: 63.5 kg 63.9 kg 64.7 kg   General.  Well-developed lady, in no acute distress. Pulmonary.  Lungs clear bilaterally, normal respiratory effort. CV.  Regular rate and rhythm, no JVD, rub or murmur. Abdomen.  Soft, nontender, nondistended, BS positive. CNS.  Alert and oriented .  No focal neurologic deficit. Extremities.  No edema, no cyanosis, pulses intact and symmetrical. Psychiatry.  Judgment and insight appears normal.   Condition at discharge: stable  The results of significant diagnostics from this hospitalization (including imaging, microbiology, ancillary and laboratory) are listed below for reference.   Imaging Studies: CARDIAC CATHETERIZATION  Result Date: 04/03/2023   Prox RCA to Mid RCA lesion is 70% stenosed.   Dist RCA lesion is 50% stenosed.   Ost LAD to Prox LAD lesion is 30% stenosed.   There is moderate left ventricular systolic dysfunction.   The left ventricular ejection fraction is 35-45% by visual estimate. 1.  Normal right sided heart pressures (PA 25/7 (13), PCWP 6/4) 2.  Mild to moderate coronary artery disease with 70% stenosis mid RCA 3.  Moderately reduced left ventricular function with estimated LV ejection fraction 35-45% Recommendations 1.  Good medical management 2.  May discharge home later today, follow-up 1 to 2 weeks   ECHOCARDIOGRAM COMPLETE  Result Date: 03/30/2023    ECHOCARDIOGRAM REPORT   Patient Name:   Jane Morrison Date of Exam: 03/30/2023 Medical Rec #:  [id number]        Height:       66.5 in Accession #:    [id number]       Weight:       148.1 lb Date of Birth:  Dec 25, 1960       BSA:          1.770 m Patient Age:    61 years         BP:           97/72 mmHg Patient Gender: F                HR:           71 bpm. Exam Location:  [facility] Procedure: 2D Echo, Cardiac Doppler and Color Doppler Indications:     CHF- acute  diastolic I50.31  History:         Patient has no prior history of Echocardiogram examinations.                  Risk Factors:Hypertension, Diabetes and Dyslipidemia.  Sonographer:     Cristela Blue Referring Phys:  [id number] JAN A MANSY Diagnosing Phys: Marcina Millard MD IMPRESSIONS  1. Left ventricular ejection fraction, by estimation,  is 25 to 30%. The left ventricle has severely decreased function. The left ventricle demonstrates regional wall motion abnormalities (see scoring diagram/findings for description). The left ventricular internal cavity size was mildly to moderately dilated. Left ventricular diastolic parameters are consistent with Grade I diastolic dysfunction (impaired relaxation).  2. Right ventricular systolic function is normal. The right ventricular size is normal.  3. The mitral valve is normal in structure. Mild mitral valve regurgitation. No evidence of mitral stenosis.  4. The aortic valve is normal in structure. Aortic valve regurgitation is not visualized. No aortic stenosis is present.  5. The inferior vena cava is normal in size with greater than 50% respiratory variability, suggesting right atrial pressure of 3 mmHg. FINDINGS  Left Ventricle: Left ventricular ejection fraction, by estimation, is 25 to 30%. The left ventricle has severely decreased function. The left ventricle demonstrates regional wall motion abnormalities. The left ventricular internal cavity size was mildly  to moderately dilated. There is no left ventricular hypertrophy. Left ventricular diastolic parameters are consistent with Grade I diastolic dysfunction (impaired relaxation). Right Ventricle: The right ventricular size is normal. No increase in right ventricular wall thickness. Right ventricular systolic function is normal. Left Atrium: Left atrial size was normal in size. Right Atrium: Right atrial size was normal in size. Pericardium: There is no evidence of pericardial effusion. Mitral Valve: The mitral valve  is normal in structure. Mild mitral valve regurgitation. No evidence of mitral valve stenosis. MV peak gradient, 7.0 mmHg. The mean mitral valve gradient is 3.0 mmHg. Tricuspid Valve: The tricuspid valve is normal in structure. Tricuspid valve regurgitation is mild . No evidence of tricuspid stenosis. Aortic Valve: The aortic valve is normal in structure. Aortic valve regurgitation is not visualized. No aortic stenosis is present. Aortic valve mean gradient measures 2.0 mmHg. Aortic valve peak gradient measures 4.8 mmHg. Aortic valve area, by VTI measures 2.46 cm. Pulmonic Valve: The pulmonic valve was normal in structure. Pulmonic valve regurgitation is not visualized. No evidence of pulmonic stenosis. Aorta: The aortic root is normal in size and structure. Venous: The inferior vena cava is normal in size with greater than 50% respiratory variability, suggesting right atrial pressure of 3 mmHg. IAS/Shunts: No atrial level shunt detected by color flow Doppler.  LEFT VENTRICLE PLAX 2D LVIDd:         5.80 cm      Diastology LVIDs:         5.30 cm      LV e' medial:    4.79 cm/s LV PW:         1.60 cm      LV E/e' medial:  13.0 LV IVS:        1.20 cm      LV e' lateral:   5.33 cm/s LVOT diam:     2.00 cm      LV E/e' lateral: 11.7 LV SV:         36 LV SV Index:   21 LVOT Area:     3.14 cm  LV Volumes (MOD) LV vol d, MOD A2C: 150.0 ml LV vol d, MOD A4C: 141.0 ml LV vol s, MOD A2C: 98.8 ml LV vol s, MOD A4C: 123.0 ml LV SV MOD A2C:     51.2 ml LV SV MOD A4C:     141.0 ml LV SV MOD BP:      32.6 ml RIGHT VENTRICLE RV Basal diam:  4.60 cm RV Mid diam:    3.40 cm RV  S prime:     12.80 cm/s TAPSE (M-mode): 2.7 cm LEFT ATRIUM             Index        RIGHT ATRIUM           Index LA diam:        3.00 cm 1.69 cm/m   RA Area:     16.30 cm LA Vol (A2C):   53.5 ml 30.22 ml/m  RA Volume:   46.20 ml  26.10 ml/m LA Vol (A4C):   63.6 ml 35.93 ml/m LA Biplane Vol: 59.6 ml 33.67 ml/m  AORTIC VALVE AV Area (Vmax):    2.18 cm AV  Area (Vmean):   2.33 cm AV Area (VTI):     2.46 cm AV Vmax:           109.00 cm/s AV Vmean:          70.300 cm/s AV VTI:            0.148 m AV Peak Grad:      4.8 mmHg AV Mean Grad:      2.0 mmHg LVOT Vmax:         75.80 cm/s LVOT Vmean:        52.200 cm/s LVOT VTI:          0.116 m LVOT/AV VTI ratio: 0.78  AORTA Ao Root diam: 2.70 cm MITRAL VALVE                TRICUSPID VALVE MV Area (PHT): 2.92 cm     TR Peak grad:   28.7 mmHg MV Area VTI:   1.62 cm     TR Vmax:        268.00 cm/s MV Peak grad:  7.0 mmHg MV Mean grad:  3.0 mmHg     SHUNTS MV Vmax:       1.32 m/s     Systemic VTI:  0.12 m MV Vmean:      73.6 cm/s    Systemic Diam: 2.00 cm MV Decel Time: 260 msec MV E velocity: 62.40 cm/s MV A velocity: 105.00 cm/s MV E/A ratio:  0.59 Marcina Millard MD Electronically signed by Marcina Millard MD Signature Date/Time: 03/30/2023/1:14:20 PM    Final    DG Chest Port 1 View  Result Date: 03/30/2023 CLINICAL DATA:  Shortness of breath EXAM: PORTABLE CHEST 1 VIEW COMPARISON:  02/20/2023 FINDINGS: Cardiomegaly with mild interstitial edema. Possible trace right pleural effusion. No pneumothorax. IMPRESSION: Cardiomegaly with mild interstitial edema and possible trace right pleural effusion. Electronically Signed   By: Charline Bills M.D.   On: 03/30/2023 01:30    Microbiology: Results for orders placed or performed during the hospital encounter of 03/30/23  Resp panel by RT-PCR (RSV, Flu A&B, Covid) Anterior Nasal Swab     Status: None   Collection Time: 03/30/23  1:01 AM   Specimen: Anterior Nasal Swab  Result Value Ref Range Status   SARS Coronavirus 2 by RT PCR NEGATIVE NEGATIVE Final    Comment: (NOTE) SARS-CoV-2 target nucleic acids are NOT DETECTED.  The SARS-CoV-2 RNA is generally detectable in upper respiratory specimens during the acute phase of infection. The lowest concentration of SARS-CoV-2 viral copies this assay can detect is 138 copies/mL. A negative result does not  preclude SARS-Cov-2 infection and should not be used as the sole basis for treatment or other patient management decisions. A negative result may occur with  improper specimen collection/handling, submission of specimen other  than nasopharyngeal swab, presence of viral mutation(s) within the areas targeted by this assay, and inadequate number of viral copies(<138 copies/mL). A negative result must be combined with clinical observations, patient history, and epidemiological information. The expected result is Negative.  Fact Sheet for Patients:  BloggerCourse.com  Fact Sheet for Healthcare Providers:  SeriousBroker.it  This test is no t yet approved or cleared by the [place] FDA and  has been authorized for detection and/or diagnosis of SARS-CoV-2 by FDA under an Emergency Use Authorization (EUA). This EUA will remain  in effect (meaning this test can be used) for the duration of the COVID-19 declaration under Section 564(b)(1) of the Act, 21 U.S.C.section 360bbb-3(b)(1), unless the authorization is terminated  or revoked sooner.       Influenza A by PCR NEGATIVE NEGATIVE Final   Influenza B by PCR NEGATIVE NEGATIVE Final    Comment: (NOTE) The Xpert Xpress SARS-CoV-2/FLU/RSV plus assay is intended as an aid in the diagnosis of influenza from Nasopharyngeal swab specimens and should not be used as a sole basis for treatment. Nasal washings and aspirates are unacceptable for Xpert Xpress SARS-CoV-2/FLU/RSV testing.  Fact Sheet for Patients: BloggerCourse.com  Fact Sheet for Healthcare Providers: SeriousBroker.it  This test is not yet approved or cleared by the [place] FDA and has been authorized for detection and/or diagnosis of SARS-CoV-2 by FDA under an Emergency Use Authorization (EUA). This EUA will remain in effect (meaning this test can be used) for the  duration of the COVID-19 declaration under Section 564(b)(1) of the Act, 21 U.S.C. section 360bbb-3(b)(1), unless the authorization is terminated or revoked.     Resp Syncytial Virus by PCR NEGATIVE NEGATIVE Final    Comment: (NOTE) Fact Sheet for Patients: BloggerCourse.com  Fact Sheet for Healthcare Providers: SeriousBroker.it  This test is not yet approved or cleared by the [place] FDA and has been authorized for detection and/or diagnosis of SARS-CoV-2 by FDA under an Emergency Use Authorization (EUA). This EUA will remain in effect (meaning this test can be used) for the duration of the COVID-19 declaration under Section 564(b)(1) of the Act, 21 U.S.C. section 360bbb-3(b)(1), unless the authorization is terminated or revoked.  Performed at [facility], [address] [place], [place] [address]   MRSA Next Gen by PCR, Nasal     Status: None   Collection Time: 03/30/23  6:17 AM   Specimen: Nasal Mucosa; Nasal Swab  Result Value Ref Range Status   MRSA by PCR Next Gen NOT DETECTED NOT DETECTED Final    Comment: (NOTE) The GeneXpert MRSA Assay (FDA approved for NASAL specimens only), is one component of a comprehensive MRSA colonization surveillance program. It is not intended to diagnose MRSA infection nor to guide or monitor treatment for MRSA infections. Test performance is not FDA approved in patients less than 98 years old. Performed at [facility], [address] [place], [place] [address]     Labs: CBC: Recent Labs  Lab 03/30/23 0101 03/30/23 0536 04/03/23 0809 04/03/23 0811 04/03/23 1040  WBC 10.4 11.0*  --   --  5.0  NEUTROABS 4.2  --   --   --   --   HGB 13.9 13.4 14.6 13.9 14.9  HCT 42.6 40.2 43.0 41.0 44.3  MCV 91.8 89.9  --   --  89.3  PLT 403* 339  --   --  396   Basic Metabolic Panel: Recent Labs  Lab 03/30/23 0536 03/31/23 0534 04/01/23 0441 04/02/23  5366  04/03/23 0527 04/03/23 0809 04/03/23 0811  NA 139 139 138 136 138 139 141  K 4.1 4.0 3.8 3.8 3.9 3.8 3.5  CL 102 100 99 100 103  --   --   CO2 29 29 31 26 27   --   --   GLUCOSE 260* 175* 141* 144* 136*  --   --   BUN 13 21* 26* 24* 26*  --   --   CREATININE 0.93 1.08* 1.13* 1.02* 1.10*  --   --   CALCIUM 8.4* 9.0 9.3 9.1 8.9  --   --    Liver Function Tests: Recent Labs  Lab 03/30/23 0101  AST 221*  ALT 131*  ALKPHOS 173*  BILITOT 0.5  PROT 7.3  ALBUMIN 3.4*   CBG: Recent Labs  Lab 04/02/23 0753 04/02/23 1202 04/02/23 1621 04/02/23 2120 04/03/23 0720  GLUCAP 152* 292* 215* 209* 141*    Discharge time spent: greater than 30 minutes.  This record has been created using Conservation officer, historic buildings. Errors have been sought and corrected,but may not always be located. Such creation errors do not reflect on the standard of care.   Signed: Arnetha Courser, MD [facility] 04/03/2023

## 2023-04-03 NOTE — Discharge Instructions (Signed)

## 2023-04-03 NOTE — Progress Notes (Cosign Needed Addendum)
[facility] CARDIOLOGY CONSULT NOTE       Patient ID: [name] MRN: [id number] DOB/AGE: 1961/02/23 63 y.o.  Admit date: 03/30/2023 Referring Physician Dr Valente David Primary Physician Della Goo, FNP  Primary Cardiologist prev Dr. Juliann Pares Reason for Consultation new CHF  HPI: Jane Morrison. [name] with a PMH of insulin dependent DM2 (A1c 10% 03/2023), HTN, current tobacco use who presented to [facility] ED 03/30/2023 with shortness of breath. Found by EMS hypoxic on room air initially requiring BiPAP and was hypertensive to 206/162.  Echo this admission resulted with new reduced ejection fraction with a EF 25-30% with severely reduced LF systolic function  mild-mod LV dilation, g1 DD, mild MR. Clinical improvement after initial diuresis.    Interval History:  - s/p R/LHC this AM with Dr. Darrold Junker with mi-mod CAD (70% stenosis mid RCA), normal R sided pressures (PA 25/7 (13), PCWP 6/4)  - feels back to baseline. No chest pain, shortness of breath, orthopnea or other complaints.  - appreciative of care she received while hospitalized, particularly her nurses in stepdown, floor, specials, and cath lab staff   Review of systems complete and found to be negative unless listed above     Past Medical History:  Diagnosis Date   Anxiety    Depression    Diabetes mellitus without complication    Hyperlipidemia    Hypertension     Past Surgical History:  Procedure Laterality Date   COLONOSCOPY WITH PROPOFOL N/A 06/29/2022   Procedure: COLONOSCOPY WITH PROPOFOL;  Surgeon: Wyline Mood, MD;  Location: [facility] ENDOSCOPY;  Service: Gastroenterology;  Laterality: N/A;   TUBAL LIGATION      Medications Prior to Admission  Medication Sig Dispense Refill Last Dose   Glucagon (GVOKE HYPOPEN 1-PACK) 0.5 MG/0.1ML SOAJ Inject 0.5 mg into the skin once as needed for up to 1 dose (for hypoglycemia). 0.1 mL 3 prn   insulin degludec (TRESIBA FLEXTOUCH) 100 UNIT/ML FlexTouch Pen Inject 10 Units  into the skin daily. 3 mL 3 03/29/2023 at 0800   losartan (COZAAR) 25 MG tablet Take 1 tablet (25 mg total) by mouth daily. 90 tablet 0 03/29/2023 at 0800   metFORMIN (GLUCOPHAGE) 500 MG tablet Take 1 tablet (500 mg total) by mouth 2 (two) times daily with a meal. 180 tablet 0 03/29/2023   Varenicline Tartrate, Starter, (CHANTIX STARTING MONTH PAK) 0.5 MG X 11 & 1 MG X 42 TBPK Take 0.5 mg tablet by mouth 1x daily for 3 days, then increase to 0.5 mg tablet 2x daily for 4 days, then increase to 1 mg tablet 2x daily. 1 each 0 03/29/2023   Accu-Chek Softclix Lancets lancets Use as instructed (Patient not taking: Reported on 03/22/2023) 100 each 12    blood glucose meter kit and supplies KIT Dispense based on patient and insurance preference. Use up to four times daily as directed. (FOR ICD-9 250.00, 250.01). (Patient not taking: Reported on 03/22/2023) 1 each 0    Glucose Blood (BLOOD GLUCOSE TEST STRIPS) STRP Use as directed to monitor FSBS once daily for DM (Patient not taking: Reported on 03/22/2023) 100 strip 3    Insulin Pen Needle (BD PEN NEEDLE MICRO U/F) 32G X 6 MM MISC Use to administer insulin (Patient not taking: Reported on 03/22/2023) 100 each 0    Social History   Socioeconomic History   Marital status: Married    Spouse name: Not on file   Number of children: 3   Years of education: Not  on file   Highest education level: Associate degree: academic program  Occupational History   Not on file  Tobacco Use   Smoking status: Every Day    Packs/day: 0.50    Years: 40.00    Additional pack years: 0.00    Total pack years: 20.00    Types: Cigarettes   Smokeless tobacco: Never  Vaping Use   Vaping Use: Never used  Substance and Sexual Activity   Alcohol use: No   Drug use: No   Sexual activity: Yes  Other Topics Concern   Not on file  Social History Narrative   3 biological kids 4 stepchildren   Social Determinants of Health   Financial Resource Strain: Low Risk  (03/21/2023)    Overall Financial Resource Strain (CARDIA)    Difficulty of Paying Living Expenses: Not hard at all  Food Insecurity: Unknown (03/21/2023)   Hunger Vital Sign    Worried About Running Out of Food in the Last Year: Patient declined    Ran Out of Food in the Last Year: Never true  Transportation Needs: No Transportation Needs (03/21/2023)   PRAPARE - Administrator, Civil Service (Medical): No    Lack of Transportation (Non-Medical): No  Physical Activity: Insufficiently Active (03/21/2023)   Exercise Vital Sign    Days of Exercise per Week: 3 days    Minutes of Exercise per Session: 10 min  Stress: Patient Declined (03/21/2023)   [employer] of Occupational Health - Occupational Stress Questionnaire    Feeling of Stress : Patient declined  Social Connections: Socially Integrated (03/21/2023)   Social Connection and Isolation Panel [NHANES]    Frequency of Communication with Friends and Family: Once a week    Frequency of Social Gatherings with Friends and Family: More than three times a week    Attends Religious Services: More than 4 times per year    Active Member of Clubs or Organizations: Yes    Attends Banker Meetings: 1 to 4 times per year    Marital Status: Married  Catering manager Violence: Not on file    Family History  Problem Relation Age of Onset   Hypertension Mother    Diabetes Mother    Hypertension Father    Diabetes Father    Breast cancer Sister 58      Intake/Output Summary (Last 24 hours) at 04/03/2023 1335 Last data filed at 04/03/2023 0607 Gross per 24 hour  Intake 1.08 ml  Output --  Net 1.08 ml     Vitals:   04/03/23 0840 04/03/23 0900 04/03/23 1000 04/03/23 1302  BP: 112/76 (!) 119/91 114/71 116/83  Pulse: 69  66 77  Resp: 14  19 18   Temp:    98.2 F (36.8 C)  TempSrc:    Oral  SpO2: 96%  94% 94%  Weight:      Height:        PHYSICAL EXAM General: Pleasant thin black female, well nourished, in no acute distress.   Sitting upright in bed HEENT:  Normocephalic and atraumatic. Neck:  No JVD.  Lungs: Normal respiratory effort on Room air. Decreased breath sounds without appreciable crackles or wheezing.  Heart: HRRR . Normal S1 and S2 without gallops or murmurs.  Abdomen: Non-distended appearing.  Msk: Normal strength and tone for age. Extremities: Warm and well perfused. No clubbing, cyanosis.  No peripheral edema. R wrist with gauze and tegaderm over arteriotomy, radial pulse 2+ distally and proximally.  Neuro: Alert and  oriented X 3. Psych:  Answers questions appropriately.   Labs: Basic Metabolic Panel: Recent Labs    04/02/23 0604 04/03/23 0527 04/03/23 0809 04/03/23 0811  NA 136 138 139 141  K 3.8 3.9 3.8 3.5  CL 100 103  --   --   CO2 26 27  --   --   GLUCOSE 144* 136*  --   --   BUN 24* 26*  --   --   CREATININE 1.02* 1.10*  --   --   CALCIUM 9.1 8.9  --   --     Liver Function Tests: No results for input(s): "AST", "ALT", "ALKPHOS", "BILITOT", "PROT", "ALBUMIN" in the last 72 hours.  No results for input(s): "LIPASE", "AMYLASE" in the last 72 hours. CBC: Recent Labs    04/03/23 0811 04/03/23 1040  WBC  --  5.0  HGB 13.9 14.9  HCT 41.0 44.3  MCV  --  89.3  PLT  --  396    Cardiac Enzymes: No results for input(s): "CKTOTAL", "CKMB", "CKMBINDEX", "TROPONINIHS" in the last 72 hours.  BNP: No results for input(s): "BNP" in the last 72 hours.  D-Dimer: No results for input(s): "DDIMER" in the last 72 hours. Hemoglobin A1C: No results for input(s): "HGBA1C" in the last 72 hours. Fasting Lipid Panel: No results for input(s): "CHOL", "HDL", "LDLCALC", "TRIG", "CHOLHDL", "LDLDIRECT" in the last 72 hours. Thyroid Function Tests: No results for input(s): "TSH", "T4TOTAL", "T3FREE", "THYROIDAB" in the last 72 hours.  Invalid input(s): "FREET3" Anemia Panel: No results for input(s): "VITAMINB12", "FOLATE", "FERRITIN", "TIBC", "IRON", "RETICCTPCT" in the last 72 hours.    Radiology: CARDIAC CATHETERIZATION  Result Date: 04/03/2023   Prox RCA to Mid RCA lesion is 70% stenosed.   Dist RCA lesion is 50% stenosed.   Ost LAD to Prox LAD lesion is 30% stenosed.   There is moderate left ventricular systolic dysfunction.   The left ventricular ejection fraction is 35-45% by visual estimate. 1.  Normal right sided heart pressures (PA 25/7 (13), PCWP 6/4) 2.  Mild to moderate coronary artery disease with 70% stenosis mid RCA 3.  Moderately reduced left ventricular function with estimated LV ejection fraction 35-45% Recommendations 1.  Good medical management 2.  May discharge home later today, follow-up 1 to 2 weeks   ECHOCARDIOGRAM COMPLETE  Result Date: 03/30/2023    ECHOCARDIOGRAM REPORT   Patient Name:   Jane Morrison Date of Exam: 03/30/2023 Medical Rec #:  [id number]        Height:       66.5 in Accession #:    [id number]       Weight:       148.1 lb Date of Birth:  14-Oct-1961       BSA:          1.770 m Patient Age:    61 years         BP:           97/72 mmHg Patient Gender: F                HR:           71 bpm. Exam Location:  [facility] Procedure: 2D Echo, Cardiac Doppler and Color Doppler Indications:     CHF- acute diastolic I50.31  History:         Patient has no prior history of Echocardiogram examinations.  Risk Factors:Hypertension, Diabetes and Dyslipidemia.  Sonographer:     Cristela Blue Referring Phys:  [id number] JAN A MANSY Diagnosing Phys: Marcina Millard MD IMPRESSIONS  1. Left ventricular ejection fraction, by estimation, is 25 to 30%. The left ventricle has severely decreased function. The left ventricle demonstrates regional wall motion abnormalities (see scoring diagram/findings for description). The left ventricular internal cavity size was mildly to moderately dilated. Left ventricular diastolic parameters are consistent with Grade I diastolic dysfunction (impaired relaxation).  2. Right ventricular systolic function is normal. The right  ventricular size is normal.  3. The mitral valve is normal in structure. Mild mitral valve regurgitation. No evidence of mitral stenosis.  4. The aortic valve is normal in structure. Aortic valve regurgitation is not visualized. No aortic stenosis is present.  5. The inferior vena cava is normal in size with greater than 50% respiratory variability, suggesting right atrial pressure of 3 mmHg. FINDINGS  Left Ventricle: Left ventricular ejection fraction, by estimation, is 25 to 30%. The left ventricle has severely decreased function. The left ventricle demonstrates regional wall motion abnormalities. The left ventricular internal cavity size was mildly  to moderately dilated. There is no left ventricular hypertrophy. Left ventricular diastolic parameters are consistent with Grade I diastolic dysfunction (impaired relaxation). Right Ventricle: The right ventricular size is normal. No increase in right ventricular wall thickness. Right ventricular systolic function is normal. Left Atrium: Left atrial size was normal in size. Right Atrium: Right atrial size was normal in size. Pericardium: There is no evidence of pericardial effusion. Mitral Valve: The mitral valve is normal in structure. Mild mitral valve regurgitation. No evidence of mitral valve stenosis. MV peak gradient, 7.0 mmHg. The mean mitral valve gradient is 3.0 mmHg. Tricuspid Valve: The tricuspid valve is normal in structure. Tricuspid valve regurgitation is mild . No evidence of tricuspid stenosis. Aortic Valve: The aortic valve is normal in structure. Aortic valve regurgitation is not visualized. No aortic stenosis is present. Aortic valve mean gradient measures 2.0 mmHg. Aortic valve peak gradient measures 4.8 mmHg. Aortic valve area, by VTI measures 2.46 cm. Pulmonic Valve: The pulmonic valve was normal in structure. Pulmonic valve regurgitation is not visualized. No evidence of pulmonic stenosis. Aorta: The aortic root is normal in size and structure.  Venous: The inferior vena cava is normal in size with greater than 50% respiratory variability, suggesting right atrial pressure of 3 mmHg. IAS/Shunts: No atrial level shunt detected by color flow Doppler.  LEFT VENTRICLE PLAX 2D LVIDd:         5.80 cm      Diastology LVIDs:         5.30 cm      LV e' medial:    4.79 cm/s LV PW:         1.60 cm      LV E/e' medial:  13.0 LV IVS:        1.20 cm      LV e' lateral:   5.33 cm/s LVOT diam:     2.00 cm      LV E/e' lateral: 11.7 LV SV:         36 LV SV Index:   21 LVOT Area:     3.14 cm  LV Volumes (MOD) LV vol d, MOD A2C: 150.0 ml LV vol d, MOD A4C: 141.0 ml LV vol s, MOD A2C: 98.8 ml LV vol s, MOD A4C: 123.0 ml LV SV MOD A2C:     51.2 ml LV SV MOD  A4C:     141.0 ml LV SV MOD BP:      32.6 ml RIGHT VENTRICLE RV Basal diam:  4.60 cm RV Mid diam:    3.40 cm RV S prime:     12.80 cm/s TAPSE (M-mode): 2.7 cm LEFT ATRIUM             Index        RIGHT ATRIUM           Index LA diam:        3.00 cm 1.69 cm/m   RA Area:     16.30 cm LA Vol (A2C):   53.5 ml 30.22 ml/m  RA Volume:   46.20 ml  26.10 ml/m LA Vol (A4C):   63.6 ml 35.93 ml/m LA Biplane Vol: 59.6 ml 33.67 ml/m  AORTIC VALVE AV Area (Vmax):    2.18 cm AV Area (Vmean):   2.33 cm AV Area (VTI):     2.46 cm AV Vmax:           109.00 cm/s AV Vmean:          70.300 cm/s AV VTI:            0.148 m AV Peak Grad:      4.8 mmHg AV Mean Grad:      2.0 mmHg LVOT Vmax:         75.80 cm/s LVOT Vmean:        52.200 cm/s LVOT VTI:          0.116 m LVOT/AV VTI ratio: 0.78  AORTA Ao Root diam: 2.70 cm MITRAL VALVE                TRICUSPID VALVE MV Area (PHT): 2.92 cm     TR Peak grad:   28.7 mmHg MV Area VTI:   1.62 cm     TR Vmax:        268.00 cm/s MV Peak grad:  7.0 mmHg MV Mean grad:  3.0 mmHg     SHUNTS MV Vmax:       1.32 m/s     Systemic VTI:  0.12 m MV Vmean:      73.6 cm/s    Systemic Diam: 2.00 cm MV Decel Time: 260 msec MV E velocity: 62.40 cm/s MV A velocity: 105.00 cm/s MV E/A ratio:  0.59 Marcina Millard  MD Electronically signed by Marcina Millard MD Signature Date/Time: 03/30/2023/1:14:20 PM    Final    DG Chest Port 1 View  Result Date: 03/30/2023 CLINICAL DATA:  Shortness of breath EXAM: PORTABLE CHEST 1 VIEW COMPARISON:  02/20/2023 FINDINGS: Cardiomegaly with mild interstitial edema. Possible trace right pleural effusion. No pneumothorax. IMPRESSION: Cardiomegaly with mild interstitial edema and possible trace right pleural effusion. Electronically Signed   By: Charline Bills M.D.   On: 03/30/2023 01:30    TELEMETRY reviewed by me (LT) 04/03/2023 : Sinus rhythm rate 60s to 80s with occasional premature atrial contractions  EKG reviewed by me: Sinus tachycardia with nonspecific TW abnormality, likely early repol, repeat 4/18 p.m. showing sinus rhythm and T wave inversions in leads II, III and aVF, and leads V4-V6 with incomplete RBBB  Data reviewed by me (LT) 04/03/2023: Hospitalist progress note, last 24h vitals tele labs imaging I/O    Principal Problem:   Acute HFrEF (heart failure with reduced ejection fraction) Active Problems:   Tobacco dependence   Acute respiratory failure with hypoxia   Hypertensive urgency   Uncontrolled type 2 diabetes  mellitus with hyperglycemia, with long-term current use of insulin   Shortness of breath   Acute on chronic congestive heart failure   Elevated troponin   Respiratory distress    ASSESSMENT AND PLAN:  Jane Morrison is a 61yoF with a PMH of insulin dependent DM2 (A1c 10% 03/2023), HTN, current tobacco use who presented to [facility] ED 03/30/2023 with shortness of breath. Found by EMS hypoxic on room air initially requiring BiPAP and was hypertensive to 206/162.  Echo this admission resulted with new reduced ejection fraction with a EF 25-30% with severely reduced LF systolic function  mild-mod LV dilation, g1 DD, mild MR. Clinical improvement after initial diuresis.    # Acute hypoxic respiratory failure # Hypertensive emergency # New HFrEF  (25-30%) Presents with sudden onset shortness of breath and orthopnea, initially hypoxic requiring BiPAP with extremely elevated blood pressures, clinically improved after nitroglycerin infusion and initial IV Lasix. Has risk factors of uncontrolled diabetes and ongoing tobacco use. Normal R sided pressures by LHC.  -s/p IV diuresis with clinical improvement  -Continue GDMT with metoprolol XL 25mg  daily, entresto 24-26mg  BID, farxiga 10mg  daily, and spiro 12.5mg  daily  - appt with Dr. Darrold Junker on 04/19/23. Will need to be out of work x 1 week with a < 15lb lifting restriction. Will also arrange for follow up with Dr. Jose Persia with [facility] heart failure following discharge.   # 1v CAD  70% stenosis mid RCA, nonobstructive disease elsewhere. Continue aspirin 81mg  daily.   # DM2 Poorly controlled, but A1c has improved from >14% 9 months ago, to 10.4 earlier this month.  # Tobacco abuse Currently smokes half pack a day, started Chantix last week.  Discussed smoking cessation in detail, she is motivated to quit entirely.  # Demand ischemia Minimal elevation with a flat trend at 41, 61, 61 which is most consistent with demand/supply mismatch and not ACS    Ok for discharge today from a cardiac perspective.   This patient's plan of care was discussed and created with Dr. Darrold Junker and he is in agreement.  Signed: Rebeca Allegra , PA-C 04/03/2023, 1:35 PM [facility]

## 2023-04-03 NOTE — Progress Notes (Signed)
Discharge instructions, med list, post cath care, heart healthy diet instructions/written info reviewed with pt and SO who verbalized understanding.  Pt has appts with PCP, cards, and heart failure clinic.  Right radial and brachial sites benign.  +CSM, dressings CDI.  VSS at discharge.  Tele and IV removed. Per cards, pt can go back to work in 1 week.  Awaiting work note from MD, then pt is ready for discharge.

## 2023-04-03 NOTE — Progress Notes (Signed)
   Heart Failure Nurse Navigator Note  Met with patient today, she was lying in bed in no acute distress.  Having just returned from recovery from cardiac catheterization.  Discussed taking care of herself at home with daily weights, low sodium diet and fluid restrictions.  She had more questions about foods to eat or to stay away from  for the diabetes.  Have contacted the dietitian, Cathie Hoops to speak with her.  Also discussed with Jodie Echevaria PA concerning follow up in the cardiology clinic and heart failure clinic.  She states she will get her in the clinic with Dr. Allena Katz next week and will follow with Ms. Hackney the second week in May.  She had no further questions.  Tresa Endo RN CHFN

## 2023-04-03 NOTE — Progress Notes (Signed)
Pt was given work note, left unit via wheelchair with volunteer services without incident

## 2023-04-03 NOTE — Plan of Care (Signed)
Nutrition Education Note  RD consulted for nutrition education regarding CHF and diabetes.  Lab Results  Component Value Date   HGBA1C 10.4 (H) 03/22/2023   PTA DM medications are 500 mg metformin BID and 10 units insulin degludec daily.   Labs reviewed: CBGS: 141 (inpatient orders for glycemic control are 0-15 units insulin aspart TID with meals, 0-5 units inuslin aspart daily at bedtime, and 12 units insulin glargine-yfgn BID).    Case discussed with CHF Navigator. Pt has multiple questions related to DM management and is requesting RD visit.   Spoke with pt at bedside, who is being discharged home today. Per pt, she is looking for further guidance for her DM, particularly what to drink. Pt reports she typically consumes water, sweet tea, gingerale, and black coffee. Reviewed DM friendly beverages as well as importance of adhering to fluid restrictions.   RD also referred pt to [facility]'s Nutrition and Diabetes Education Services for additional support and reinforcement.   RD provided "Heart Healthy, Consistent Carbohydrate Nutrition Therapy" handout from the Academy of Nutrition and Dietetics. Reviewed patient's dietary recall. Provided examples on ways to decrease sodium intake in diet. Discouraged intake of processed foods and use of salt shaker. Encouraged fresh fruits and vegetables as well as whole grain sources of carbohydrates to maximize fiber intake.   RD discussed why it is important for patient to adhere to diet recommendations, and emphasized the role of fluids, foods to avoid, and importance of weighing self daily.   Discussed different food groups and their effects on blood sugar, emphasizing carbohydrate-containing foods. Provided list of carbohydrates and recommended serving sizes of common foods.  Discussed importance of controlled and consistent carbohydrate intake throughout the day. Provided examples of ways to balance meals/snacks and encouraged intake of high-fiber,  whole grain complex carbohydrates. Teach back method used.  Expect fair to good compliance.  Current diet order is Heart Healthy (liberalize to 2 gram sodium), patient is consuming approximately 25-100% of meals at this time. Labs and medications reviewed. No further nutrition interventions warranted at this time. RD contact information provided. If additional nutrition issues arise, please re-consult RD.   Levada Schilling, RD, LDN, CDCES Registered Dietitian II Certified Diabetes Care and Education Specialist Please refer to [facility] for RD and/or RD on-call/weekend/after hours pager

## 2023-04-04 ENCOUNTER — Other Ambulatory Visit (HOSPITAL_COMMUNITY): Payer: Self-pay

## 2023-04-05 ENCOUNTER — Other Ambulatory Visit: Payer: Self-pay

## 2023-04-06 ENCOUNTER — Other Ambulatory Visit (HOSPITAL_COMMUNITY): Payer: Self-pay

## 2023-04-06 ENCOUNTER — Encounter (HOSPITAL_COMMUNITY): Payer: Self-pay

## 2023-04-17 ENCOUNTER — Other Ambulatory Visit (HOSPITAL_COMMUNITY): Payer: Self-pay

## 2023-04-17 ENCOUNTER — Ambulatory Visit (INDEPENDENT_AMBULATORY_CARE_PROVIDER_SITE_OTHER): Payer: 59 | Admitting: Nurse Practitioner

## 2023-04-17 ENCOUNTER — Encounter: Payer: Self-pay | Admitting: Nurse Practitioner

## 2023-04-17 ENCOUNTER — Other Ambulatory Visit: Payer: Self-pay | Admitting: Nurse Practitioner

## 2023-04-17 ENCOUNTER — Other Ambulatory Visit: Payer: Self-pay

## 2023-04-17 ENCOUNTER — Telehealth: Payer: Self-pay | Admitting: Nurse Practitioner

## 2023-04-17 VITALS — BP 132/78 | HR 89 | Temp 98.5°F | Resp 16 | Ht 66.5 in | Wt 149.5 lb

## 2023-04-17 DIAGNOSIS — Z09 Encounter for follow-up examination after completed treatment for conditions other than malignant neoplasm: Secondary | ICD-10-CM

## 2023-04-17 DIAGNOSIS — I5021 Acute systolic (congestive) heart failure: Secondary | ICD-10-CM | POA: Diagnosis not present

## 2023-04-17 LAB — CBC WITH DIFFERENTIAL/PLATELET
Absolute Monocytes: 630 cells/uL (ref 200–950)
Eosinophils Relative: 1.3 %
Lymphs Abs: 2444 cells/uL (ref 850–3900)
Monocytes Relative: 10 %
Neutro Abs: 3112 cells/uL (ref 1500–7800)
RBC: 4.4 10*6/uL (ref 3.80–5.10)
WBC: 6.3 10*3/uL (ref 3.8–10.8)

## 2023-04-17 MED ORDER — EMPAGLIFLOZIN 10 MG PO TABS
10.0000 mg | ORAL_TABLET | Freq: Every day | ORAL | 1 refills | Status: DC
Start: 2023-04-17 — End: 2023-05-17

## 2023-04-17 NOTE — Telephone Encounter (Signed)
2nd request for this medication today. Patient had HFU with provider today.

## 2023-04-17 NOTE — Telephone Encounter (Signed)
Medication Refill - Medication: sacubitril-valsartan (ENTRESTO) 24-26 MG [[id number]]   Pt reports medication was sent to the wrong pharmacy. Requesting if medication can be sent to the below pharmacy.  Has the patient contacted their pharmacy? Yes.    (Agent: If no, request that the patient contact the pharmacy for the refill. If patient does not wish to contact the pharmacy document the reason why and proceed with request.) (Agent: If yes, when and what did the pharmacy advise?)  Preferred Pharmacy (with phone number or street name): [id number] - [place], [place] - [address] Phone: [phone]  Fax: [phone]   Has the patient been seen for an appointment in the last year OR does the patient have an upcoming appointment? Yes.    Agent: Please be advised that RX refills may take up to 3 business days. We ask that you follow-up with your pharmacy.

## 2023-04-17 NOTE — Progress Notes (Signed)
BP 132/78   Pulse 89   Temp 98.5 F (36.9 C) (Oral)   Resp 16   Ht 5' 6.5" (1.689 m)   Wt 149 lb 8 oz (67.8 kg)   SpO2 98%   BMI 23.77 kg/m    Subjective:    Patient ID: Jane Morrison, female    DOB: Jun 03, 1961, 62 y.o.   MRN: [id number]  HPI: Jane Morrison is a 62 y.o. female  Chief Complaint  Patient presents with   Hospitalization Follow-up   Medication Problem    Issues with getting medication   Hospital follow up/ acute heart failure:  was admitted on 03/30/2023 at [facility] and discharged on 04/03/2023. She went to the emergency room for shortness of breath.  She was hypoxic with o2 sat of 66% on room air, she was put on BiPAP.  Chest xray showed cardiomegaly with mild interstitial pulmonary edema and possible trace right pleural effusion. Patient was treated with lasix, morphine, zofran and nitroglycerine.  Echo showed new diagnosis of HFrEF with EF of 25-30% regional wall motion abnormalities and grade 1 diastolic dysfunction.  Cardiac cath done on 04/03/2023.    Prox RCA to Mid RCA lesion is 70% stenosed.   Dist RCA lesion is 50% stenosed.   Ost LAD to Prox LAD lesion is 30% stenosed.   There is moderate left ventricular systolic dysfunction.   The left ventricular ejection fraction is 35-45% by visual estimate.   1.  Normal right sided heart pressures (PA 25/7 (13), PCWP 6/4) 2.  Mild to moderate coronary artery disease with 70% stenosis mid RCA 3.  Moderately reduced left ventricular function with estimated LV ejection fraction 35-45%   Follow up appointment with cardiology is :  appointment with heart failure clinic is 04/26/2023 She also has an appointment with diabetic education on 05/05/2023, discussed taking her glucometer with her to the appointment.   She is taking 12 units of insulin a day,  she is unable to get her glucometer to work.  Discussed bringing it to the office for [place] to look at.    Patient reports she is taking losartan 50 mg daily, because she has  not been able to get the farxiga or entresto.  Contacted pharmacy patient advocate and he replied that farxiga was not covered by insurance.  Will send in prescription for jardiance and start PA. She also reports she can not afford the entresto that it is 600 dollars. According to mediation assitance patient advocate, she has a 0 dollar copay with the heart failure copay assistance program.  Printed off the information and gave it to patient, to have pharmacy run the coupon. Patient is to reach out if there are any problems.    Discharge plan:  * Acute HFrEF (heart failure with reduced ejection fraction) Patient with no prior diagnosis of heart failure.  Symptoms are concerning for heart failure and echocardiogram with new onset HFrEF with a EF of 25 to 30%, regional wall motion abnormalities, severely depressed ventricular function and grade 1 diastolic dysfunction. Cardiology is on board-going for cardiac cath on Monday morning -Keep holding Lasix -Cardiology is switching losartan with Entresto -Spironolactone was also added    Acute respiratory failure with hypoxia Resolved.  Patient is back on room air Most likely secondary to acute heart failure causing pulmonary edema.  Patient initially required BiPAP.  No baseline oxygen use    Hypertensive urgency Patient presented with pulmonary edema causing acute hypoxic respiratory failure with elevated blood  pressure.  Initially placed on nitroglycerin drip. Blood pressure improved and patient is being weaned off from infusion. -Continue metoprolol -Losartan is being switched with Entresto -Spironolactone was added -As needed hydralazine   Uncontrolled type 2 diabetes mellitus with hyperglycemia, with long-term current use of insulin Patient has uncontrolled diabetes with hyperglycemia and most recent A1c of 10.4. She was on [place] and metformin at home.  CBG remained elevated -Increasing the dose of Semglee to 12 units twice  daily   Relevant past medical, surgical, family and social history reviewed and updated as indicated. Interim medical history since our last visit reviewed. Allergies and medications reviewed and updated.  Review of Systems  Constitutional: Negative for fever or weight change.  Respiratory: Negative for cough and shortness of breath.   Cardiovascular: Negative for chest pain or palpitations.  Gastrointestinal: Negative for abdominal pain, no bowel changes.  Musculoskeletal: Negative for gait problem or joint swelling.  Skin: Negative for rash.  Neurological: Negative for dizziness or headache.  No other specific complaints in a complete review of systems (except as listed in HPI above).      Objective:    BP 132/78   Pulse 89   Temp 98.5 F (36.9 C) (Oral)   Resp 16   Ht 5' 6.5" (1.689 m)   Wt 149 lb 8 oz (67.8 kg)   SpO2 98%   BMI 23.77 kg/m   Wt Readings from Last 3 Encounters:  04/17/23 149 lb 8 oz (67.8 kg)  04/03/23 142 lb 10.2 oz (64.7 kg)  03/22/23 146 lb 11.2 oz (66.5 kg)    Physical Exam  Constitutional: Patient appears well-developed and well-nourished.  No distress.  HEENT: head atraumatic, normocephalic, pupils equal and reactive to light, neck supple Cardiovascular: Normal rate, regular rhythm and normal heart sounds.  No murmur heard. No BLE edema. Pulmonary/Chest: Effort normal and breath sounds normal. No respiratory distress. Abdominal: Soft.  There is no tenderness. Psychiatric: Patient has a normal mood and affect. behavior is normal. Judgment and thought content normal.  Results for orders placed or performed during the hospital encounter of 03/30/23  Resp panel by RT-PCR (RSV, Flu A&B, Covid) Anterior Nasal Swab   Specimen: Anterior Nasal Swab  Result Value Ref Range   SARS Coronavirus 2 by RT PCR NEGATIVE NEGATIVE   Influenza A by PCR NEGATIVE NEGATIVE   Influenza B by PCR NEGATIVE NEGATIVE   Resp Syncytial Virus by PCR NEGATIVE NEGATIVE  MRSA  Next Gen by PCR, Nasal   Specimen: Nasal Mucosa; Nasal Swab  Result Value Ref Range   MRSA by PCR Next Gen NOT DETECTED NOT DETECTED  CBC with Differential  Result Value Ref Range   WBC 10.4 4.0 - 10.5 K/uL   RBC 4.64 3.87 - 5.11 MIL/uL   Hemoglobin 13.9 12.0 - 15.0 g/dL   HCT 16.1 09.6 - 04.5 %   MCV 91.8 80.0 - 100.0 fL   MCH 30.0 26.0 - 34.0 pg   MCHC 32.6 30.0 - 36.0 g/dL   RDW 40.9 81.1 - 91.4 %   Platelets 403 (H) 150 - 400 K/uL   nRBC 0.0 0.0 - 0.2 %   Neutrophils Relative % 41 %   Neutro Abs 4.2 1.7 - 7.7 K/uL   Lymphocytes Relative 52 %   Lymphs Abs 5.4 (H) 0.7 - 4.0 K/uL   Monocytes Relative 6 %   Monocytes Absolute 0.6 0.1 - 1.0 K/uL   Eosinophils Relative 1 %   Eosinophils Absolute 0.1 0.0 -  0.5 K/uL   Basophils Relative 0 %   Basophils Absolute 0.0 0.0 - 0.1 K/uL   Immature Granulocytes 0 %   Abs Immature Granulocytes 0.03 0.00 - 0.07 K/uL  Comprehensive metabolic panel  Result Value Ref Range   Sodium 137 135 - 145 mmol/L   Potassium 3.5 3.5 - 5.1 mmol/L   Chloride 103 98 - 111 mmol/L   CO2 24 22 - 32 mmol/L   Glucose, Bld 310 (H) 70 - 99 mg/dL   BUN 14 6 - 20 mg/dL   Creatinine, Ser 1.61 (H) 0.44 - 1.00 mg/dL   Calcium 8.4 (L) 8.9 - 10.3 mg/dL   Total Protein 7.3 6.5 - 8.1 g/dL   Albumin 3.4 (L) 3.5 - 5.0 g/dL   AST 096 (H) 15 - 41 U/L   ALT 131 (H) 0 - 44 U/L   Alkaline Phosphatase 173 (H) 38 - 126 U/L   Total Bilirubin 0.5 0.3 - 1.2 mg/dL   GFR, Estimated >04 >54 mL/min   Anion gap 10 5 - 15  Brain natriuretic peptide  Result Value Ref Range   B Natriuretic Peptide 471.0 (H) 0.0 - 100.0 pg/mL  Blood gas, arterial  Result Value Ref Range   FIO2 40 %   Delivery systems BILEVEL POSITIVE AIRWAY PRESSURE    RATE 12 resp/min   Inspiratory PAP 10 cmH2O   Expiratory PAP 5 cmH2O   pH, Arterial 7.37 7.35 - 7.45   pCO2 arterial 47 32 - 48 mmHg   pO2, Arterial 86 83 - 108 mmHg   Bicarbonate 27.2 20.0 - 28.0 mmol/L   Acid-Base Excess 1.3 0.0 - 2.0  mmol/L   O2 Saturation 98.5 %   Patient temperature 37.0    Collection site RIGHT RADIAL    Allens test (pass/fail) PASS PASS  Basic metabolic panel  Result Value Ref Range   Sodium 139 135 - 145 mmol/L   Potassium 4.1 3.5 - 5.1 mmol/L   Chloride 102 98 - 111 mmol/L   CO2 29 22 - 32 mmol/L   Glucose, Bld 260 (H) 70 - 99 mg/dL   BUN 13 6 - 20 mg/dL   Creatinine, Ser 0.98 0.44 - 1.00 mg/dL   Calcium 8.4 (L) 8.9 - 10.3 mg/dL   GFR, Estimated >11 >91 mL/min   Anion gap 8 5 - 15  CBC  Result Value Ref Range   WBC 11.0 (H) 4.0 - 10.5 K/uL   RBC 4.47 3.87 - 5.11 MIL/uL   Hemoglobin 13.4 12.0 - 15.0 g/dL   HCT 47.8 29.5 - 62.1 %   MCV 89.9 80.0 - 100.0 fL   MCH 30.0 26.0 - 34.0 pg   MCHC 33.3 30.0 - 36.0 g/dL   RDW 30.8 65.7 - 84.6 %   Platelets 339 150 - 400 K/uL   nRBC 0.0 0.0 - 0.2 %  Glucose, capillary  Result Value Ref Range   Glucose-Capillary 194 (H) 70 - 99 mg/dL  Glucose, capillary  Result Value Ref Range   Glucose-Capillary 252 (H) 70 - 99 mg/dL  Glucose, capillary  Result Value Ref Range   Glucose-Capillary 276 (H) 70 - 99 mg/dL  Glucose, capillary  Result Value Ref Range   Glucose-Capillary 114 (H) 70 - 99 mg/dL  Basic metabolic panel  Result Value Ref Range   Sodium 139 135 - 145 mmol/L   Potassium 4.0 3.5 - 5.1 mmol/L   Chloride 100 98 - 111 mmol/L   CO2 29 22 - 32 mmol/L  Glucose, Bld 175 (H) 70 - 99 mg/dL   BUN 21 (H) 6 - 20 mg/dL   Creatinine, Ser 8.11 (H) 0.44 - 1.00 mg/dL   Calcium 9.0 8.9 - 91.4 mg/dL   GFR, Estimated 59 (L) >60 mL/min   Anion gap 10 5 - 15  Glucose, capillary  Result Value Ref Range   Glucose-Capillary 174 (H) 70 - 99 mg/dL  Glucose, capillary  Result Value Ref Range   Glucose-Capillary 160 (H) 70 - 99 mg/dL  Lipid panel  Result Value Ref Range   Cholesterol 159 0 - 200 mg/dL   Triglycerides 782 <956 mg/dL   HDL 41 >21 mg/dL   Total CHOL/HDL Ratio 3.9 RATIO   VLDL 25 0 - 40 mg/dL   LDL Cholesterol 93 0 - 99 mg/dL   Glucose, capillary  Result Value Ref Range   Glucose-Capillary 406 (H) 70 - 99 mg/dL  Basic metabolic panel  Result Value Ref Range   Sodium 138 135 - 145 mmol/L   Potassium 3.8 3.5 - 5.1 mmol/L   Chloride 99 98 - 111 mmol/L   CO2 31 22 - 32 mmol/L   Glucose, Bld 141 (H) 70 - 99 mg/dL   BUN 26 (H) 6 - 20 mg/dL   Creatinine, Ser 3.08 (H) 0.44 - 1.00 mg/dL   Calcium 9.3 8.9 - 65.7 mg/dL   GFR, Estimated 56 (L) >60 mL/min   Anion gap 8 5 - 15  Glucose, capillary  Result Value Ref Range   Glucose-Capillary 95 70 - 99 mg/dL  Glucose, capillary  Result Value Ref Range   Glucose-Capillary 173 (H) 70 - 99 mg/dL  Glucose, capillary  Result Value Ref Range   Glucose-Capillary 224 (H) 70 - 99 mg/dL  Glucose, capillary  Result Value Ref Range   Glucose-Capillary 166 (H) 70 - 99 mg/dL  Glucose, capillary  Result Value Ref Range   Glucose-Capillary 183 (H) 70 - 99 mg/dL  Basic metabolic panel  Result Value Ref Range   Sodium 136 135 - 145 mmol/L   Potassium 3.8 3.5 - 5.1 mmol/L   Chloride 100 98 - 111 mmol/L   CO2 26 22 - 32 mmol/L   Glucose, Bld 144 (H) 70 - 99 mg/dL   BUN 24 (H) 6 - 20 mg/dL   Creatinine, Ser 8.46 (H) 0.44 - 1.00 mg/dL   Calcium 9.1 8.9 - 96.2 mg/dL   GFR, Estimated >95 >28 mL/min   Anion gap 10 5 - 15  Glucose, capillary  Result Value Ref Range   Glucose-Capillary 259 (H) 70 - 99 mg/dL  Glucose, capillary  Result Value Ref Range   Glucose-Capillary 152 (H) 70 - 99 mg/dL  Glucose, capillary  Result Value Ref Range   Glucose-Capillary 292 (H) 70 - 99 mg/dL  Glucose, capillary  Result Value Ref Range   Glucose-Capillary 215 (H) 70 - 99 mg/dL  Basic metabolic panel  Result Value Ref Range   Sodium 138 135 - 145 mmol/L   Potassium 3.9 3.5 - 5.1 mmol/L   Chloride 103 98 - 111 mmol/L   CO2 27 22 - 32 mmol/L   Glucose, Bld 136 (H) 70 - 99 mg/dL   BUN 26 (H) 6 - 20 mg/dL   Creatinine, Ser 4.13 (H) 0.44 - 1.00 mg/dL   Calcium 8.9 8.9 - 24.4 mg/dL   GFR,  Estimated 58 (L) >60 mL/min   Anion gap 8 5 - 15  Glucose, capillary  Result Value Ref Range   Glucose-Capillary  209 (H) 70 - 99 mg/dL  CBC  Result Value Ref Range   WBC 5.0 4.0 - 10.5 K/uL   RBC 4.96 3.87 - 5.11 MIL/uL   Hemoglobin 14.9 12.0 - 15.0 g/dL   HCT 16.1 09.6 - 04.5 %   MCV 89.3 80.0 - 100.0 fL   MCH 30.0 26.0 - 34.0 pg   MCHC 33.6 30.0 - 36.0 g/dL   RDW 40.9 81.1 - 91.4 %   Platelets 396 150 - 400 K/uL   nRBC 0.0 0.0 - 0.2 %  Glucose, capillary  Result Value Ref Range   Glucose-Capillary 141 (H) 70 - 99 mg/dL  I-STAT 7, (LYTES, BLD GAS, ICA, H+H)  Result Value Ref Range   pH, Arterial 7.389 7.35 - 7.45   pCO2 arterial 42.0 32 - 48 mmHg   pO2, Arterial 62 (L) 83 - 108 mmHg   Bicarbonate 25.4 20.0 - 28.0 mmol/L   TCO2 27 22 - 32 mmol/L   O2 Saturation 91 %   Acid-Base Excess 0.0 0.0 - 2.0 mmol/L   Sodium 139 135 - 145 mmol/L   Potassium 3.8 3.5 - 5.1 mmol/L   Calcium, Ion 1.22 1.15 - 1.40 mmol/L   HCT 43.0 36.0 - 46.0 %   Hemoglobin 14.6 12.0 - 15.0 g/dL   Sample type ARTERIAL   POCT I-Stat EG7  Result Value Ref Range   pH, Ven 7.368 7.25 - 7.43   pCO2, Ven 44.5 44 - 60 mmHg   pO2, Ven 36 32 - 45 mmHg   Bicarbonate 25.6 20.0 - 28.0 mmol/L   TCO2 27 22 - 32 mmol/L   O2 Saturation 67 %   Acid-Base Excess 0.0 0.0 - 2.0 mmol/L   Sodium 141 135 - 145 mmol/L   Potassium 3.5 3.5 - 5.1 mmol/L   Calcium, Ion 1.09 (L) 1.15 - 1.40 mmol/L   HCT 41.0 36.0 - 46.0 %   Hemoglobin 13.9 12.0 - 15.0 g/dL   Sample type VENOUS    Comment NOTIFIED PHYSICIAN   ECHOCARDIOGRAM COMPLETE  Result Value Ref Range   Weight 2,370.39 oz   Height 66.5 in   BP 123/80 mmHg   Ao pk vel 1.09 m/s   AV Area VTI 2.46 cm2   AR max vel 2.18 cm2   AV Mean grad 2.0 mmHg   AV Peak grad 4.8 mmHg   Single Plane A2C EF 34.1 %   Single Plane A4C EF 12.8 %   Calc EF 21.7 %   S' Lateral 5.30 cm   AV Area mean vel 2.33 cm2   Area-P 1/2 2.92 cm2   MV VTI 1.62 cm2   Est EF 25 - 30%    Troponin I (High Sensitivity)  Result Value Ref Range   Troponin I (High Sensitivity) 41 (H) <18 ng/L  Troponin I (High Sensitivity)  Result Value Ref Range   Troponin I (High Sensitivity) 63 (H) <18 ng/L  Troponin I (High Sensitivity)  Result Value Ref Range   Troponin I (High Sensitivity) 63 (H) <18 ng/L      Assessment & Plan:   Problem List Items Addressed This Visit       Cardiovascular and Mediastinum   Acute HFrEF (heart failure with reduced ejection fraction) (HCC)    She has been doing well since discharge from the hospital. She has not been able to start the entresto or the farxiga due to coverage.  Discussed with the heart failure assistance Advice worker.  Farixga not covered  will order jardiance and do PA.  Also entresto is covered by assistance program, gave patient information for program and to have pharmacy use coupon code not her insurance. She is to call the office if she has any further problems      Relevant Medications   losartan (COZAAR) 50 MG tablet   empagliflozin (JARDIANCE) 10 MG TABS tablet   Other Relevant Orders   CBC with Differential/Platelet   Basic metabolic panel   Other Visit Diagnoses     Hospital discharge follow-up    -  Primary   patient reports she is doing well, except having trouble getting her medications. she also has her appointment with heart failure clinic scheduled   Relevant Orders   CBC with Differential/Platelet   Basic metabolic panel        Follow up plan: Return in about 2 months (around 06/17/2023) for follow up.

## 2023-04-17 NOTE — Telephone Encounter (Signed)
Requested medication (s) are due for refill today: Yes  Requested medication (s) are on the active medication list: Yes  Last refill:  04/03/23  Future visit scheduled: Yes  Notes to clinic:  Unable to refill per protocol, last refill by another provider. This was prescribed at discharge by hospital provider and was sent to the wrong pharmacy, patient requesting to be sent to a different pharmacy.     Requested Prescriptions  Pending Prescriptions Disp Refills   sacubitril-valsartan (ENTRESTO) 24-26 MG 60 tablet 1    Sig: Take 1 tablet by mouth 2 (two) times daily.     Off-Protocol Failed - 04/17/2023 12:50 PM      Failed - Medication not assigned to a protocol, review manually.      Passed - Valid encounter within last 12 months    Recent Outpatient Visits           Today Hospital discharge follow-up   [facility] Berniece Salines, FNP   3 weeks ago Tobacco dependence   [facility] Berniece Salines, FNP   10 months ago Weight loss   [facility] Berniece Salines, FNP       Future Appointments             In 3 months Zane Herald, Rudolpho Sevin, FNP [facility]

## 2023-04-17 NOTE — Telephone Encounter (Unsigned)
Copied from CRM [id number]. Topic: General - Other >> Apr 17, 2023  2:18 PM Everette C wrote: Reason for CRM: Medication Refill - Medication: Rx #: [id number]  sacubitril-valsartan (ENTRESTO) 24-26 MG [[id number]]    Rx #: [id number]  dapagliflozin propanediol (FARXIGA) 10 MG TABS tablet [[id number]]    Has the patient contacted their pharmacy? Yes.   (Agent: If no, request that the patient contact the pharmacy for the refill. If patient does not wish to contact the pharmacy document the reason why and proceed with request.) (Agent: If yes, when and what did the pharmacy advise?)  Preferred Pharmacy (with phone number or street name): [id number] - [place], [place] - [address] [place] [address] Phone: [phone] Fax: [phone] Hours: Not open 24 hours   Has the patient been seen for an appointment in the last year OR does the patient have an upcoming appointment? Yes.    Agent: Please be advised that RX refills may take up to 3 business days. We ask that you follow-up with your pharmacy.

## 2023-04-17 NOTE — Assessment & Plan Note (Signed)
She has been doing well since discharge from the hospital. She has not been able to start the entresto or the farxiga due to coverage.  Discussed with the heart failure assistance Advice worker.  Farixga not covered will order jardiance and do PA.  Also entresto is covered by assistance program, gave patient information for program and to have pharmacy use coupon code not her insurance. She is to call the office if she has any further problems

## 2023-04-18 ENCOUNTER — Telehealth: Payer: Self-pay | Admitting: Emergency Medicine

## 2023-04-18 ENCOUNTER — Other Ambulatory Visit (HOSPITAL_COMMUNITY): Payer: Self-pay

## 2023-04-18 ENCOUNTER — Other Ambulatory Visit: Payer: Self-pay

## 2023-04-18 LAB — BASIC METABOLIC PANEL
BUN: 17 mg/dL (ref 7–25)
CO2: 24 mmol/L (ref 20–32)
Calcium: 9.6 mg/dL (ref 8.6–10.4)
Chloride: 102 mmol/L (ref 98–110)
Creat: 1.03 mg/dL (ref 0.50–1.03)
Glucose, Bld: 151 mg/dL — ABNORMAL HIGH (ref 65–99)
Potassium: 4.9 mmol/L (ref 3.5–5.3)
Sodium: 137 mmol/L (ref 135–146)

## 2023-04-18 LAB — CBC WITH DIFFERENTIAL/PLATELET
Basophils Absolute: 32 cells/uL (ref 0–200)
Basophils Relative: 0.5 %
Eosinophils Absolute: 82 cells/uL (ref 15–500)
HCT: 39.1 % (ref 35.0–45.0)
Hemoglobin: 13.3 g/dL (ref 11.7–15.5)
MCH: 30.2 pg (ref 27.0–33.0)
MCHC: 34 g/dL (ref 32.0–36.0)
MCV: 88.9 fL (ref 80.0–100.0)
MPV: 10.5 fL (ref 7.5–12.5)
Neutrophils Relative %: 49.4 %
Platelets: 311 10*3/uL (ref 140–400)
RDW: 12.9 % (ref 11.0–15.0)
Total Lymphocyte: 38.8 %

## 2023-04-18 NOTE — Telephone Encounter (Unsigned)
Copied from CRM [id number]. Topic: General - Inquiry >> Apr 18, 2023  3:54 PM Runell Gess P wrote: Reason for CRM: pt called saying her pharmacy is telling her that her insurance will not cover the Jardiance.  She is wanting someone to check on the insurance.  Pt says she has two insurances.  I ask her if the pharmacy has both insurance information.  CB#  [phone]

## 2023-04-18 NOTE — Telephone Encounter (Signed)
Spoke to patient and gave her number to [facility]. Patient stated medication is there but [facility]

## 2023-04-18 NOTE — Telephone Encounter (Signed)
Spoke to patient and she stated that the medication was sent to [facility]. I gave her number and told her to call and check for her medication

## 2023-04-19 NOTE — Telephone Encounter (Signed)
PA has been sent. °

## 2023-04-26 ENCOUNTER — Other Ambulatory Visit: Payer: Self-pay

## 2023-04-26 ENCOUNTER — Ambulatory Visit: Payer: 59 | Attending: Family | Admitting: Family

## 2023-04-26 ENCOUNTER — Encounter: Payer: Self-pay | Admitting: Family

## 2023-04-26 ENCOUNTER — Other Ambulatory Visit: Payer: Self-pay | Admitting: Nurse Practitioner

## 2023-04-26 VITALS — BP 151/85 | HR 78 | Wt 152.4 lb

## 2023-04-26 DIAGNOSIS — I5022 Chronic systolic (congestive) heart failure: Secondary | ICD-10-CM

## 2023-04-26 DIAGNOSIS — Z716 Tobacco abuse counseling: Secondary | ICD-10-CM

## 2023-04-26 DIAGNOSIS — Z7984 Long term (current) use of oral hypoglycemic drugs: Secondary | ICD-10-CM | POA: Diagnosis not present

## 2023-04-26 DIAGNOSIS — Z79899 Other long term (current) drug therapy: Secondary | ICD-10-CM | POA: Diagnosis not present

## 2023-04-26 DIAGNOSIS — E1165 Type 2 diabetes mellitus with hyperglycemia: Secondary | ICD-10-CM

## 2023-04-26 DIAGNOSIS — F17200 Nicotine dependence, unspecified, uncomplicated: Secondary | ICD-10-CM

## 2023-04-26 DIAGNOSIS — I2510 Atherosclerotic heart disease of native coronary artery without angina pectoris: Secondary | ICD-10-CM | POA: Diagnosis not present

## 2023-04-26 DIAGNOSIS — I428 Other cardiomyopathies: Secondary | ICD-10-CM | POA: Insufficient documentation

## 2023-04-26 DIAGNOSIS — I10 Essential (primary) hypertension: Secondary | ICD-10-CM | POA: Diagnosis not present

## 2023-04-26 DIAGNOSIS — I110 Hypertensive heart disease with heart failure: Secondary | ICD-10-CM | POA: Insufficient documentation

## 2023-04-26 DIAGNOSIS — Z87891 Personal history of nicotine dependence: Secondary | ICD-10-CM | POA: Insufficient documentation

## 2023-04-26 DIAGNOSIS — Z794 Long term (current) use of insulin: Secondary | ICD-10-CM | POA: Insufficient documentation

## 2023-04-26 DIAGNOSIS — E119 Type 2 diabetes mellitus without complications: Secondary | ICD-10-CM | POA: Diagnosis not present

## 2023-04-26 DIAGNOSIS — E785 Hyperlipidemia, unspecified: Secondary | ICD-10-CM | POA: Insufficient documentation

## 2023-04-26 MED ORDER — SACUBITRIL-VALSARTAN 24-26 MG PO TABS
1.0000 | ORAL_TABLET | Freq: Two times a day (BID) | ORAL | 1 refills | Status: DC
  Filled 2023-04-26: qty 60, 30d supply, fill #0
  Filled 2023-04-27: qty 60, 30d supply, fill #1

## 2023-04-26 MED ORDER — EMPAGLIFLOZIN 10 MG PO TABS
10.0000 mg | ORAL_TABLET | Freq: Every day | ORAL | 1 refills | Status: DC
  Filled 2023-04-26: qty 30, 30d supply, fill #0
  Filled 2023-04-27 – 2023-05-09 (×3): qty 30, 30d supply, fill #1

## 2023-04-26 NOTE — Progress Notes (Signed)
Advanced Heart Failure Clinic Note    PCP: Berniece Salines, FNP (last seen 05/24) PCP-Cardiologist: Marcina Millard, MD (last seen during recent admission)  HPI:  Jane Morrison is a 62 y/o female with a history of DM, hyperlipidemia, HTN, anxiety, depression, CAD, tobacco use & chronic heart failure (newly diagnosed)  Echo 03/30/23: EF 25-30% with mild/ moderate LVH consistent with Grade I DD and mild MR.   RHC/LHC 04/03/23:   Prox RCA to Mid RCA lesion is 70% stenosed.   Dist RCA lesion is 50% stenosed.   Ost LAD to Prox LAD lesion is 30% stenosed.   There is moderate left ventricular systolic dysfunction.   The left ventricular ejection fraction is 35-45% by visual estimate.   1.  Normal right sided heart pressures (PA 25/7 (13), PCWP 6/4) 2.  Mild to moderate coronary artery disease with 70% stenosis mid RCA 3.  Moderately reduced left ventricular function with estimated LV ejection fraction 35-45%  Admitted 03/30/23 due to acute onset of worsening dyspnea, associated orthopnea, chest pressure and cough productive of clear sputum as well as wheezing. Diuresed with IV lasix. NTG drip started and cardiology consulted. Initially needed bipap due to hypoxia but able to be weaned off to room air. Cath done per above. Was in the ED 02/20/23 due to acute cough.    She presents today for her initial HF Clinic visit with a chief complaint of minimal fatigue with moderate exertion. Chronic in nature although markedly improved from her recent admission. Was quite SOB with PND/ orthopnea leading up to there admission but says that has completely resolved.   Is concerned that she hasn't been able to get her jardiance or entresto because [facility] is telling her that these meds aren't covered and will be hundreds of dollars. She has paperwork that shows approval from [facility] regarding 100% coverage of her jardiance.   Review of Systems: [y] = yes, [ ]  = no   General: Weight gain [ ] ; Weight loss [ ] ;  Anorexia [ y]; Fatigue [ ] ; Fever [ ] ; Chills [ ] ; Weakness [ ]   Cardiac: Chest pain/pressure [ ] ; Resting SOB [ ] ; Exertional SOB [ ] ; Orthopnea [ ] ; Pedal Edema [ ] ; Palpitations [ ] ; Syncope [ ] ; Presyncope [ ] ; Paroxysmal nocturnal dyspnea[ ]   Pulmonary: Cough [ ] ; Wheezing[ ] ; Hemoptysis[ ] ; Sputum [ ] ; Snoring [ ]   GI: Vomiting[ ] ; Dysphagia[ ] ; Melena[ ] ; Hematochezia [ ] ; Heartburn[ ] ; Abdominal pain [ ] ; Constipation [ ] ; Diarrhea [ ] ; BRBPR [ ]   GU: Hematuria[ ] ; Dysuria [ ] ; Nocturia[ ]   Vascular: Pain in legs with walking [ ] ; Pain in feet with lying flat [ ] ; Non-healing sores [ ] ; Stroke [ ] ; TIA [ ] ; Slurred speech [ ] ;  Neuro: Headaches[ ] ; Vertigo[ ] ; Seizures[ ] ; Paresthesias[ ] ;Blurred vision [ ] ; Diplopia [ ] ; Vision changes [ ]   Ortho/Skin: Arthritis [ ] ; Joint pain [ ] ; Muscle pain [ ] ; Joint swelling [ ] ; Back Pain [ ] ; Rash [ ]   Psych: Depression[ ] ; Anxiety[ ]   Heme: Bleeding problems [ ] ; Clotting disorders [ ] ; Anemia [ ]   Endocrine: Diabetes [ ] ; Thyroid dysfunction[ ]    Past Medical History:  Diagnosis Date   Anxiety    Depression    Diabetes mellitus without complication (HCC)    Hyperlipidemia    Hypertension     Current Outpatient Medications  Medication Sig Dispense Refill   Accu-Chek Softclix Lancets lancets Use as instructed (Patient not  taking: Reported on 03/22/2023) 100 each 12   aspirin 81 MG chewable tablet Chew 1 tablet (81 mg total) by mouth daily. 90 tablet 0   blood glucose meter kit and supplies KIT Dispense based on patient and insurance preference. Use up to four times daily as directed. (FOR ICD-9 250.00, 250.01). (Patient not taking: Reported on 03/22/2023) 1 each 0   empagliflozin (JARDIANCE) 10 MG TABS tablet Take 1 tablet (10 mg total) by mouth daily before breakfast. 30 tablet 1   Glucagon (GVOKE HYPOPEN 1-PACK) 0.5 MG/0.1ML SOAJ Inject 0.5 mg into the skin once as needed for up to 1 dose (for hypoglycemia). 0.1 mL 3   Glucose Blood  (BLOOD GLUCOSE TEST STRIPS) STRP Use as directed to monitor FSBS once daily for DM (Patient not taking: Reported on 03/22/2023) 100 strip 3   insulin degludec (TRESIBA FLEXTOUCH) 100 UNIT/ML FlexTouch Pen Inject 10 Units into the skin daily. 3 mL 3   Insulin Pen Needle (BD PEN NEEDLE MICRO U/F) 32G X 6 MM MISC Use to administer insulin (Patient not taking: Reported on 03/22/2023) 100 each 0   losartan (COZAAR) 50 MG tablet Take 50 mg by mouth daily.     metFORMIN (GLUCOPHAGE) 500 MG tablet Take 1 tablet (500 mg total) by mouth 2 (two) times daily with a meal. 180 tablet 0   metoprolol succinate (TOPROL-XL) 25 MG 24 hr tablet Take 1 tablet (25 mg total) by mouth daily. 30 tablet 2   sacubitril-valsartan (ENTRESTO) 24-26 MG Take 1 tablet by mouth 2 (two) times daily. (Patient not taking: Reported on 04/17/2023) 60 tablet 1   spironolactone (ALDACTONE) 25 MG tablet Take 0.5 tablets (12.5 mg total) by mouth daily. 30 tablet 1   Varenicline Tartrate, Starter, (CHANTIX STARTING MONTH PAK) 0.5 MG X 11 & 1 MG X 42 TBPK Take 0.5 mg tablet by mouth 1x daily for 3 days, then increase to 0.5 mg tablet 2x daily for 4 days, then increase to 1 mg tablet 2x daily. (Patient not taking: Reported on 04/17/2023) 1 each 0   No current facility-administered medications for this visit.    No Known Allergies    Social History   Socioeconomic History   Marital status: Married    Spouse name: Not on file   Number of children: 3   Years of education: Not on file   Highest education level: Associate degree: academic program  Occupational History   Not on file  Tobacco Use   Smoking status: Every Day    Packs/day: 0.50    Years: 40.00    Additional pack years: 0.00    Total pack years: 20.00    Types: Cigarettes   Smokeless tobacco: Never  Vaping Use   Vaping Use: Never used  Substance and Sexual Activity   Alcohol use: No   Drug use: No   Sexual activity: Yes  Other Topics Concern   Not on file  Social  History Narrative   3 biological kids 4 stepchildren   Social Determinants of Health   Financial Resource Strain: Low Risk  (03/21/2023)   Overall Financial Resource Strain (CARDIA)    Difficulty of Paying Living Expenses: Not hard at all  Food Insecurity: Unknown (03/21/2023)   Hunger Vital Sign    Worried About Running Out of Food in the Last Year: Patient declined    Ran Out of Food in the Last Year: Never true  Transportation Needs: No Transportation Needs (03/21/2023)   PRAPARE - Transportation  Lack of Transportation (Medical): No    Lack of Transportation (Non-Medical): No  Physical Activity: Insufficiently Active (03/21/2023)   Exercise Vital Sign    Days of Exercise per Week: 3 days    Minutes of Exercise per Session: 10 min  Stress: Patient Declined (03/21/2023)   [employer] of Occupational Health - Occupational Stress Questionnaire    Feeling of Stress : Patient declined  Social Connections: Socially Integrated (03/21/2023)   Social Connection and Isolation Panel [NHANES]    Frequency of Communication with Friends and Family: Once a week    Frequency of Social Gatherings with Friends and Family: More than three times a week    Attends Religious Services: More than 4 times per year    Active Member of Clubs or Organizations: Yes    Attends Banker Meetings: 1 to 4 times per year    Marital Status: Married  Catering manager Violence: Not on file    Family History  Problem Relation Age of Onset   Hypertension Mother    Diabetes Mother    Hypertension Father    Diabetes Father    Breast cancer Sister 77   Vitals:   04/26/23 1455  BP: (!) 151/85  Pulse: 78  SpO2: 97%  Weight: 152 lb 6.4 oz (69.1 kg)   Wt Readings from Last 3 Encounters:  04/26/23 152 lb 6.4 oz (69.1 kg)  04/17/23 149 lb 8 oz (67.8 kg)  04/03/23 142 lb 10.2 oz (64.7 kg)   Lab Results  Component Value Date   CREATININE 1.03 04/17/2023   CREATININE 1.10 (H) 04/03/2023    CREATININE 1.02 (H) 04/02/2023   PHYSICAL EXAM: General:  Well appearing. No respiratory difficulty HEENT: normal Neck: supple. no JVD. No lymphadenopathy or thyromegaly appreciated. Cor: PMI nondisplaced. Regular rate & rhythm. No rubs, gallops or murmurs. Lungs: clear Abdomen: soft, nontender, nondistended. No hepatosplenomegaly. No bruits or masses.  Extremities: no cyanosis, clubbing, rash, edema Neuro: alert & oriented x 3, cranial nerves grossly intact. moves all 4 extremities w/o difficulty. Affect pleasant.  ECG: today is NSR wit LVH; unchanged from previous EKG   ASSESSMENT & PLAN:  1: NICM with reduced ejection fraction- - NYHA class II - euvolemic - etiology likely HTN/ DM - begin weighing daily and call for an overnight weight gain of > 2 pounds or a weekly weight gain of >5 pounds; she feels like she's gaining weight because she's no longer smoking, discussed using lollipops etc to replace the hand to mouth of smoking - Echo 03/30/23: EF 25-30% with mild/ moderate LVH consistent with Grade I DD and mild MR.  - begin jardiance 10mg  daily when she picks it up from [facility] - continue losartan 50mg  daily until she picks up entresto 24/26mg  BID from [facility]; wrote on [facility] that when she starts entresto, she will not longer take losartan - continue spironolactone 12.5mg  daily - continue metoprolol succinate 25mg  daily - discussed GDMT and titration of these meds at future visits - BNP 03/30/23 was 471.0 - PharmD reconciled meds w/ patient  2: HTN- - BP 151/85 but will be starting entresto per above - saw PCP Jane Morrison) 05/24 - BMP 04/17/23 showed sodium 137, potassium 4.9, creatinine 1.03 - BMP next visit  3: CAD- - saw cardiology (Jane Morrison) during recent admission; f/u appt has been scheduled for 06/14/23 as patient needs appts on Wed due to work schedule - EKG today is NSR - RHC/LHC 04/03/23:   Prox RCA to  Mid RCA lesion is 70% stenosed.   Dist  RCA lesion is 50% stenosed.   Ost LAD to Prox LAD lesion is 30% stenosed.   There is moderate left ventricular systolic dysfunction.   The left ventricular ejection fraction is 35-45% by visual estimate.  1.  Normal right sided heart pressures (PA 25/7 (13), PCWP 6/4) 2.  Mild to moderate coronary artery disease with 70% stenosis mid RCA 3.  Moderately reduced left ventricular function with estimated LV ejection fraction 35-45%  4: DM- - A1c 03/22/23 was 10.4%  Return in 3 weeks, sooner if needed  Jane Morrison, [place] 04/26/23

## 2023-04-26 NOTE — Patient Instructions (Addendum)
Please keep your appointment with Dr.Paraschos July 3rd at 10:30am.    Begin weighing daily and call for an overnight weight gain of 3 pounds or more or a weekly weight gain of more than 5 pounds.   When you pick up the entresto, you will stop taking losartan.    Go to the [employer], 1st floor all the way back past the elevator to the pharmacy to get your entresto and jardiance.

## 2023-04-26 NOTE — Progress Notes (Signed)
[facility] HEART FAILURE CLINIC - Pharmacist Note  Jane Morrison is a 62 y.o. female with HFrEF (EF <40%) presenting to the Heart Failure Clinic to establish care. Patient was discharged from [facility] 04/03/2023 and has not been able to pick up Entresto and [facility] due to cost despite approval for Heart Failure Copay Assistance grant. She has continued on metoprolol, losartan, and spironolactone in the interim. After discussion with the patient, she is agreeable to transfer Sherryll Burger and Jardiance prescriptions to [facility] outpatient pharmacy to ensure that Heart Failure Copay Assistance grant will be utilized. She has not started daily weights at home. Patient was counseled on the importance of this in the setting of heart failure. She reports drinking 4-5 bottles of water daily and one cup of decaf coffee per day. She reports no signs or symptoms of volume overload at this visit.   Recent ED Visit (past 6 months):  Date: 03/30/2023, CC: dyspnea Date: 02/20/2023, CC: productive cough  Guideline-Directed Medical Therapy/Evidence Based Medicine ACE/ARB/ARNI: Losartan 50 mg daily Beta Blocker: Metoprolol succinate 25 mg daily Aldosterone Antagonist: Spironolactone 12.5 mg daily Diuretic:  none SGLT2i:  none  Adherence Assessment Do you ever forget to take your medication? [] Yes [x] No  Do you ever skip doses due to side effects? [] Yes [x] No  Do you have trouble affording your medicines? [] Yes [x] No  Are you ever unable to pick up your medication due to transportation difficulties? [] Yes [x] No  Do you ever stop taking your medications because you don't believe they are helping? [] Yes [x] No  Do you check your weight daily? [] Yes [x] No  Adherence strategy: none reported Barriers to obtaining medications: trouble with application of Heart Failure Copay Assistance  Diagnostics ECHO: Date 03/30/2023, EF 25-30%, RWMA, G1DD Cath: Date 04/03/2023, EF 35-40%, moderate LV dysfunction, mild/moderate  CAD  Vitals    04/26/2023    2:55 PM 04/17/2023   10:38 AM 04/03/2023    1:02 PM  Vitals with BMI  Height  5' 6.5"   Weight 152 lbs 6 oz 149 lbs 8 oz   BMI 24.23 23.77   Systolic 151 132 161  Diastolic 85 78 83  Pulse 78 89 77     Recent Labs    Latest Ref Rng & Units 04/17/2023   11:13 AM 04/03/2023    8:11 AM 04/03/2023    8:09 AM  BMP  Glucose 65 - 99 mg/dL 096     BUN 7 - 25 mg/dL 17     Creatinine 0.45 - 1.03 mg/dL 4.09     BUN/Creat Ratio 6 - 22 (calc) SEE NOTE:     Sodium 135 - 146 mmol/L 137  141  139   Potassium 3.5 - 5.3 mmol/L 4.9  3.5  3.8   Chloride 98 - 110 mmol/L 102     CO2 20 - 32 mmol/L 24     Calcium 8.6 - 10.4 mg/dL 9.6       Past Medical History Past Medical History:  Diagnosis Date   Anxiety    CHF (congestive heart failure) (HCC)    Depression    Diabetes mellitus without complication (HCC)    Hyperlipidemia    Hypertension     Plan Continue current regimen as directed by NP Switch from losartan to Entresto 24/26 twice daily Start Jardiance 10 mg daily Encourage daily weights  Titrate GDMT as able   Time spent: 10 minutes  Celene Squibb, PharmD PGY1 Pharmacy Resident 04/26/2023 3:34 PM

## 2023-04-27 ENCOUNTER — Other Ambulatory Visit: Payer: Self-pay | Admitting: Nurse Practitioner

## 2023-04-27 ENCOUNTER — Other Ambulatory Visit: Payer: Self-pay

## 2023-04-27 ENCOUNTER — Encounter: Payer: Self-pay | Admitting: Emergency Medicine

## 2023-04-27 ENCOUNTER — Emergency Department: Payer: Worker's Compensation

## 2023-04-27 ENCOUNTER — Emergency Department
Admission: EM | Admit: 2023-04-27 | Discharge: 2023-04-27 | Disposition: A | Discharge status: Home / Self Care | Payer: Worker's Compensation | Attending: Emergency Medicine | Admitting: Emergency Medicine

## 2023-04-27 ENCOUNTER — Other Ambulatory Visit (HOSPITAL_COMMUNITY): Payer: Self-pay

## 2023-04-27 DIAGNOSIS — S0990XA Unspecified injury of head, initial encounter: Secondary | ICD-10-CM | POA: Diagnosis not present

## 2023-04-27 DIAGNOSIS — F17200 Nicotine dependence, unspecified, uncomplicated: Secondary | ICD-10-CM

## 2023-04-27 DIAGNOSIS — I110 Hypertensive heart disease with heart failure: Secondary | ICD-10-CM | POA: Insufficient documentation

## 2023-04-27 DIAGNOSIS — I509 Heart failure, unspecified: Secondary | ICD-10-CM | POA: Diagnosis not present

## 2023-04-27 DIAGNOSIS — Y990 Civilian activity done for income or pay: Secondary | ICD-10-CM | POA: Insufficient documentation

## 2023-04-27 DIAGNOSIS — W19XXXA Unspecified fall, initial encounter: Secondary | ICD-10-CM

## 2023-04-27 DIAGNOSIS — W01198A Fall on same level from slipping, tripping and stumbling with subsequent striking against other object, initial encounter: Secondary | ICD-10-CM | POA: Diagnosis not present

## 2023-04-27 DIAGNOSIS — E119 Type 2 diabetes mellitus without complications: Secondary | ICD-10-CM | POA: Insufficient documentation

## 2023-04-27 MED ORDER — VARENICLINE TARTRATE 1 MG PO TABS
1.0000 mg | ORAL_TABLET | Freq: Two times a day (BID) | ORAL | 1 refills | Status: DC
Start: 2023-04-27 — End: 2023-05-23

## 2023-04-27 NOTE — Discharge Instructions (Signed)
Take acetaminophen 650 mg and ibuprofen 400 mg every 6 hours for pain.  Take with food.  

## 2023-04-27 NOTE — ED Triage Notes (Signed)
Patient to ED via POV for a fall. Patient states she was walking into work form outside and tripped over drain hole. Patient states head hit cement- c/o head and neck pain. Denies LOC, take aspirin daily.

## 2023-04-27 NOTE — ED Provider Notes (Signed)
[facility] Provider Note    Event Date/Time   First MD Initiated Contact with Patient 04/27/23 1553     (approximate)   History   Fall   HPI  Jane Morrison is a 62 y.o. female   Past medical history of CHF, depression, diabetes, hyperlipidemia and hypertension presents emergency department with trip and fall while at work at [employer].  There was a drain that was halfway open and she slipped over it falling and striking the back of her head on the ground.  No loss consciousness no thinners.  Mild headache at this time.  No other injuries.  Independent Historian contributed to assessment above: Her husband who is at bedside corroborates information given above     Physical Exam   Triage Vital Signs: ED Triage Vitals  Enc Vitals Group     BP 04/27/23 1457 (!) 158/90     Pulse Rate 04/27/23 1457 79     Resp 04/27/23 1457 18     Temp 04/27/23 1457 98.3 F (36.8 C)     Temp Source 04/27/23 1457 Oral     SpO2 04/27/23 1457 96 %     Weight --      Height --      Head Circumference --      Peak Flow --      Pain Score 04/27/23 1458 5     Pain Loc --      Pain Edu? --      Excl. in GC? --     Most recent vital signs: Vitals:   04/27/23 1457  BP: (!) 158/90  Pulse: 79  Resp: 18  Temp: 98.3 F (36.8 C)  SpO2: 96%    General: Awake, no distress.  CV:  Good peripheral perfusion.  Resp:  Normal effort.  Abd:  No distention.  Other:  Awake alert comfortable moving all extremities walking with steady gait no signs of head injury on external exam, neck supple full range of motion.   ED Results / Procedures / Treatments   Labs (all labs ordered are listed, but only abnormal results are displayed) Labs Reviewed - No data to display     RADIOLOGY I independently reviewed and interpreted CT scan of the head see no obvious bleeding or midline shift   PROCEDURES:  Critical Care performed: No  Procedures   MEDICATIONS ORDERED IN  ED: Medications - No data to display  IMPRESSION / MDM / ASSESSMENT AND PLAN / ED COURSE  I reviewed the triage vital signs and the nursing notes.                                Patient's presentation is most consistent with acute presentation with potential threat to life or bodily function.  Differential diagnosis includes, but is not limited to, blunt traumatic injury to the head or neck including intracranial bleeding C-spine fracture dislocation, concussion    MDM: Patient with a mechanical slip and fall and head injury with negative imaging for emergent pathologies looks well, concussion guidance given and a work note and she will follow-up with her PMD.        FINAL CLINICAL IMPRESSION(S) / ED DIAGNOSES   Final diagnoses:  Fall, initial encounter  Minor head injury, initial encounter     Rx / DC Orders   ED Discharge Orders     None  Note:  This document was prepared using Dragon voice recognition software and may include unintentional dictation errors.    Pilar Jarvis, MD 04/27/23 [phone]

## 2023-05-02 ENCOUNTER — Other Ambulatory Visit (HOSPITAL_COMMUNITY): Payer: Self-pay

## 2023-05-02 ENCOUNTER — Other Ambulatory Visit: Payer: Self-pay

## 2023-05-04 ENCOUNTER — Telehealth: Payer: Self-pay

## 2023-05-04 NOTE — Progress Notes (Signed)
Spoke with pt and notified her that London Pepper was approved by insurance. Pt had good understanding and no further questions.

## 2023-05-05 ENCOUNTER — Ambulatory Visit: Payer: 59 | Admitting: Skilled Nursing Facility1

## 2023-05-09 ENCOUNTER — Other Ambulatory Visit: Payer: Self-pay

## 2023-05-15 ENCOUNTER — Other Ambulatory Visit: Payer: Self-pay

## 2023-05-17 ENCOUNTER — Other Ambulatory Visit
Admission: RE | Admit: 2023-05-17 | Discharge: 2023-05-17 | Disposition: A | Discharge status: Home / Self Care | Payer: 59 | Source: Ambulatory Visit | Attending: Family | Admitting: Family

## 2023-05-17 ENCOUNTER — Encounter: Payer: Self-pay | Admitting: Family

## 2023-05-17 ENCOUNTER — Other Ambulatory Visit (HOSPITAL_COMMUNITY): Payer: Self-pay

## 2023-05-17 ENCOUNTER — Ambulatory Visit (HOSPITAL_BASED_OUTPATIENT_CLINIC_OR_DEPARTMENT_OTHER): Payer: 59 | Admitting: Family

## 2023-05-17 VITALS — BP 138/97 | HR 80 | Wt 153.0 lb

## 2023-05-17 DIAGNOSIS — I2510 Atherosclerotic heart disease of native coronary artery without angina pectoris: Secondary | ICD-10-CM

## 2023-05-17 DIAGNOSIS — E1165 Type 2 diabetes mellitus with hyperglycemia: Secondary | ICD-10-CM

## 2023-05-17 DIAGNOSIS — I5022 Chronic systolic (congestive) heart failure: Secondary | ICD-10-CM

## 2023-05-17 DIAGNOSIS — I10 Essential (primary) hypertension: Secondary | ICD-10-CM

## 2023-05-17 LAB — BASIC METABOLIC PANEL
Anion gap: 10 (ref 5–15)
BUN: 20 mg/dL (ref 6–20)
CO2: 24 mmol/L (ref 22–32)
Calcium: 9.4 mg/dL (ref 8.9–10.3)
Chloride: 104 mmol/L (ref 98–111)
Creatinine, Ser: 1.13 mg/dL — ABNORMAL HIGH (ref 0.44–1.00)
GFR, Estimated: 56 mL/min — ABNORMAL LOW (ref >60)
Glucose, Bld: 245 mg/dL — ABNORMAL HIGH (ref 70–99)
Potassium: 4.4 mmol/L (ref 3.5–5.1)
Sodium: 138 mmol/L (ref 135–145)

## 2023-05-17 MED ORDER — SACUBITRIL-VALSARTAN 49-51 MG PO TABS: 1.0000 | ORAL_TABLET | Freq: Two times a day (BID) | ORAL | 5 refills | Status: DC

## 2023-05-17 NOTE — Progress Notes (Signed)
PCP: Berniece Salines, FNP (last seen 05/24) Cardiologist: Marcina Millard, MD (last seen during recent admission)  HPI:  Ms Wojciechowski is a 62 y/o female with a history of DM, hyperlipidemia, HTN, anxiety, depression, CAD, tobacco use & chronic heart failure (newly diagnosed)  Echo 03/30/23: EF 25-30% with mild/ moderate LVH consistent with Grade I DD and mild MR.   RHC/LHC 04/03/23:   Prox RCA to Mid RCA lesion is 70% stenosed.   Dist RCA lesion is 50% stenosed.   Ost LAD to Prox LAD lesion is 30% stenosed.   There is moderate left ventricular systolic dysfunction.   The left ventricular ejection fraction is 35-45% by visual estimate.   1.  Normal right sided heart pressures (PA 25/7 (13), PCWP 6/4) 2.  Mild to moderate coronary artery disease with 70% stenosis mid RCA 3.  Moderately reduced left ventricular function with estimated LV ejection fraction 35-45%  Was in the ED 04/27/23 after a mechanical fall after she tripped while at work at [employer]. Hit her head on the ground but no LOC. Negative imaging. Admitted 03/30/23 due to acute onset of worsening dyspnea, associated orthopnea, chest pressure and cough productive of clear sputum as well as wheezing. Diuresed with IV lasix. NTG drip started and cardiology consulted. Initially needed bipap due to hypoxia but able to be weaned off to room air. Cath done per above. Was in the ED 02/20/23 due to acute cough.    She presents today with a chief complaint of a follow-up visit. Currently denies fatigue, SOB, cough, chest pain, palpitations, edema, abdominal edema, dizziness, weight gain or difficulty sleeping. Weight at home is running 147-149 pounds.   Had a fall a couple of weeks ago while at work and says that she was quite sore afterwards. Says that all the soreness has since resolved.   ROS: All systems negative except as listed in HPI, PMH and Problem List.  SH:  Social History   Socioeconomic History   Marital status: Married     Spouse name: Not on file   Number of children: 3   Years of education: Not on file   Highest education level: Associate degree: academic program  Occupational History   Not on file  Tobacco Use   Smoking status: Former    Packs/day: 0.50    Years: 40.00    Additional pack years: 0.00    Total pack years: 20.00    Types: Cigarettes    Quit date: 03/29/2023    Years since quitting: 0.1    Passive exposure: Past   Smokeless tobacco: Never  Vaping Use   Vaping Use: Never used  Substance and Sexual Activity   Alcohol use: No   Drug use: No   Sexual activity: Yes  Other Topics Concern   Not on file  Social History Narrative   3 biological kids 4 stepchildren   Social Determinants of Health   Financial Resource Strain: Low Risk  (03/21/2023)   Overall Financial Resource Strain (CARDIA)    Difficulty of Paying Living Expenses: Not hard at all  Food Insecurity: Unknown (03/21/2023)   Hunger Vital Sign    Worried About Running Out of Food in the Last Year: Patient declined    Ran Out of Food in the Last Year: Never true  Transportation Needs: No Transportation Needs (03/21/2023)   PRAPARE - Administrator, Civil Service (Medical): No    Lack of Transportation (Non-Medical): No  Physical Activity: Insufficiently Active (03/21/2023)  Exercise Vital Sign    Days of Exercise per Week: 3 days    Minutes of Exercise per Session: 10 min  Stress: Patient Declined (03/21/2023)   [employer] of Occupational Health - Occupational Stress Questionnaire    Feeling of Stress : Patient declined  Social Connections: Socially Integrated (03/21/2023)   Social Connection and Isolation Panel [NHANES]    Frequency of Communication with Friends and Family: Once a week    Frequency of Social Gatherings with Friends and Family: More than three times a week    Attends Religious Services: More than 4 times per year    Active Member of [employer] or Organizations: Yes    Attends Tax inspector Meetings: 1 to 4 times per year    Marital Status: Married  Catering manager Violence: Not on file    FH:  Family History  Problem Relation Age of Onset   Hypertension Mother    Diabetes Mother    Hypertension Father    Diabetes Father    Breast cancer Sister 75    Past Medical History:  Diagnosis Date   Anxiety    CHF (congestive heart failure) (HCC)    Depression    Diabetes mellitus without complication (HCC)    Hyperlipidemia    Hypertension     Current Outpatient Medications  Medication Sig Dispense Refill   Accu-Chek Softclix Lancets lancets Use as instructed (Patient not taking: Reported on 03/22/2023) 100 each 12   aspirin 81 MG chewable tablet Chew 1 tablet (81 mg total) by mouth daily. 90 tablet 0   blood glucose meter kit and supplies KIT Dispense based on patient and insurance preference. Use up to four times daily as directed. (FOR ICD-9 250.00, 250.01). (Patient not taking: Reported on 03/22/2023) 1 each 0   empagliflozin (JARDIANCE) 10 MG TABS tablet Take 1 tablet (10 mg total) by mouth daily before breakfast. (Patient not taking: Reported on 04/26/2023) 30 tablet 1   empagliflozin (JARDIANCE) 10 MG TABS tablet Take 1 tablet (10 mg total) by mouth daily before breakfast. 30 tablet 1   Glucagon (GVOKE HYPOPEN 1-PACK) 0.5 MG/0.1ML SOAJ Inject 0.5 mg into the skin once as needed for up to 1 dose (for hypoglycemia). 0.1 mL 3   Glucose Blood (BLOOD GLUCOSE TEST STRIPS) STRP Use as directed to monitor FSBS once daily for DM (Patient not taking: Reported on 03/22/2023) 100 strip 3   insulin degludec (TRESIBA FLEXTOUCH) 100 UNIT/ML FlexTouch Pen Inject 10 Units into the skin daily. (Patient taking differently: Inject 12 Units into the skin daily.) 3 mL 3   Insulin Pen Needle (BD PEN NEEDLE MICRO U/F) 32G X 6 MM MISC Use to administer insulin (Patient not taking: Reported on 03/22/2023) 100 each 0   losartan (COZAAR) 50 MG tablet Take 50 mg by mouth daily.      metFORMIN (GLUCOPHAGE) 500 MG tablet Take 1 tablet (500 mg total) by mouth 2 (two) times daily with a meal. 180 tablet 0   metoprolol succinate (TOPROL-XL) 25 MG 24 hr tablet Take 1 tablet (25 mg total) by mouth daily. 30 tablet 2   sacubitril-valsartan (ENTRESTO) 24-26 MG Take 1 tablet by mouth 2 (two) times daily. (Patient not taking: Reported on 04/17/2023) 60 tablet 1   sacubitril-valsartan (ENTRESTO) 24-26 MG Take 1 tablet by mouth 2 (two) times daily. 60 tablet 1   spironolactone (ALDACTONE) 25 MG tablet Take 0.5 tablets (12.5 mg total) by mouth daily. 30 tablet 1   varenicline (CHANTIX CONTINUING  MONTH PAK) 1 MG tablet Take 1 tablet (1 mg total) by mouth 2 (two) times daily. 60 tablet 1   No current facility-administered medications for this visit.   Vitals:   05/17/23 1317  BP: (!) 138/97  Pulse: 80  SpO2: 98%  Weight: 153 lb (69.4 kg)   Wt Readings from Last 3 Encounters:  05/17/23 153 lb (69.4 kg)  04/26/23 152 lb 6.4 oz (69.1 kg)  04/17/23 149 lb 8 oz (67.8 kg)   Lab Results  Component Value Date   CREATININE 1.03 04/17/2023   CREATININE 1.10 (H) 04/03/2023   CREATININE 1.02 (H) 04/02/2023   PHYSICAL EXAM:  General:  Well appearing. No resp difficulty HEENT: normal Neck: supple. JVP flat.;no bruits.No thyromegaly or lymphadenopathy appreciated. Cor: PMI normal. Regular rate & rhythm. No rubs, gallops or murmurs. Lungs: clear Abdomen: soft, nontender, nondistended. No hepatosplenomegaly. No bruits or masses. Good bowel sounds. Extremities: no cyanosis, clubbing, rash, edema Neuro: alert & oriented x3, cranial nerves grossly intact. Moves all 4 extremities w/o difficulty. Affect pleasant.   ECG: not done   ASSESSMENT & PLAN:  1: NICM with reduced ejection fraction- - etiology likely HTN/ DM - NYHA class I - euvolemic - weighing daily; reminded to call for an overnight weight gain of > 2 pounds or a weekly weight gain of >5 pounds - weight stable from last visit  here 1 month ago - Echo 03/30/23: EF 25-30% with mild/ moderate LVH consistent with Grade I DD and mild MR.  - continue jardiance 10mg  daily  - continue entresto 24/26mg  BID - will get BMP today and titrate entresto up if labs are ok - continue spironolactone 12.5mg  daily - continue metoprolol succinate 25mg  daily - BNP 03/30/23 was 471.0 - PharmD reconciled meds w/ patient  2: HTN- - BP 138/97 - saw PCP Zane Herald) 05/24 - BMP 04/17/23 showed sodium 137, potassium 4.9, creatinine 1.03 - BMP today  3: CAD- - saw cardiology (Paraschos) during recent admission; f/u appt has been scheduled for 06/14/23  - RHC/LHC 04/03/23:   Prox RCA to Mid RCA lesion is 70% stenosed.   Dist RCA lesion is 50% stenosed.   Ost LAD to Prox LAD lesion is 30% stenosed.   There is moderate left ventricular systolic dysfunction.   The left ventricular ejection fraction is 35-45% by visual estimate.  1.  Normal right sided heart pressures (PA 25/7 (13), PCWP 6/4) 2.  Mild to moderate coronary artery disease with 70% stenosis mid RCA 3.  Moderately reduced left ventricular function with estimated LV ejection fraction 35-45%  4: DM- - A1c 03/22/23 was 10.4%  Return in 1 month, sooner if needed. BMP next visit

## 2023-05-18 ENCOUNTER — Other Ambulatory Visit (HOSPITAL_COMMUNITY): Payer: Self-pay

## 2023-05-20 ENCOUNTER — Other Ambulatory Visit: Payer: Self-pay | Admitting: Nurse Practitioner

## 2023-05-20 DIAGNOSIS — F17200 Nicotine dependence, unspecified, uncomplicated: Secondary | ICD-10-CM

## 2023-05-22 NOTE — Telephone Encounter (Signed)
Requested medication (s) are due for refill today: Yes  Requested medication (s) are on the active medication list: Yes  Last refill:  04/27/23  Future visit scheduled: Yes  Notes to clinic:  Manual review.    Requested Prescriptions  Pending Prescriptions Disp Refills   varenicline (CHANTIX) 1 MG tablet [Pharmacy Med Name: VARENICLINE 1 MG TABLET] 180 tablet 1    Sig: TAKE 1 TABLET BY MOUTH TWICE A DAY     Psychiatry:  Drug Dependence Therapy - varenicline Failed - 05/20/2023  2:33 PM      Failed - Manual Review: Do not refill starter pack. 1 mg tabs may be extended up to one year if the patient has quit smoking but still feels at risk for relapse.      Failed - Cr in normal range and within 180 days    Creat  Date Value Ref Range Status  04/17/2023 1.03 0.50 - 1.03 mg/dL Final   Creatinine, Ser  Date Value Ref Range Status  05/17/2023 1.13 (H) 0.44 - 1.00 mg/dL Final   Creatinine, Urine  Date Value Ref Range Status  03/22/2023 105 20 - 275 mg/dL Final         Passed - Completed PHQ-2 or PHQ-9 in the last 360 days      Passed - Valid encounter within last 6 months    Recent Outpatient Visits           1 month ago Hospital discharge follow-up   [facility] Berniece Salines, FNP   2 months ago Tobacco dependence   [facility] Berniece Salines, FNP   11 months ago Weight loss   [facility] Berniece Salines, FNP       Future Appointments             In 2 months Zane Herald, Rudolpho Sevin, FNP [facility], [facility]

## 2023-05-24 ENCOUNTER — Ambulatory Visit: Payer: 59 | Admitting: Podiatry

## 2023-05-28 ENCOUNTER — Other Ambulatory Visit: Payer: Self-pay | Admitting: Nurse Practitioner

## 2023-05-28 DIAGNOSIS — E1165 Type 2 diabetes mellitus with hyperglycemia: Secondary | ICD-10-CM

## 2023-05-29 MED ORDER — BD PEN NEEDLE MICRO U/F 32G X 6 MM MISC
0 refills | Status: DC
Start: 2023-05-29 — End: 2023-07-26

## 2023-06-02 ENCOUNTER — Other Ambulatory Visit: Payer: Self-pay

## 2023-06-05 ENCOUNTER — Other Ambulatory Visit: Payer: Self-pay | Admitting: Family

## 2023-06-05 ENCOUNTER — Other Ambulatory Visit: Payer: Self-pay

## 2023-06-05 MED ORDER — ENTRESTO 24-26 MG PO TABS
1.0000 | ORAL_TABLET | Freq: Two times a day (BID) | ORAL | 4 refills | Status: DC
  Filled 2023-06-05: qty 60, 30d supply, fill #0

## 2023-06-05 MED FILL — Sacubitril-Valsartan Tab 24-26 MG: ORAL | 30 days supply | Qty: 60 | Fill #0 | Status: CN

## 2023-06-07 ENCOUNTER — Ambulatory Visit (INDEPENDENT_AMBULATORY_CARE_PROVIDER_SITE_OTHER): Payer: 59

## 2023-06-07 ENCOUNTER — Encounter: Payer: Self-pay | Admitting: Podiatry

## 2023-06-07 ENCOUNTER — Ambulatory Visit (INDEPENDENT_AMBULATORY_CARE_PROVIDER_SITE_OTHER): Payer: PRIVATE HEALTH INSURANCE | Admitting: Podiatry

## 2023-06-07 DIAGNOSIS — M2041 Other hammer toe(s) (acquired), right foot: Secondary | ICD-10-CM | POA: Diagnosis not present

## 2023-06-07 DIAGNOSIS — B351 Tinea unguium: Secondary | ICD-10-CM | POA: Diagnosis not present

## 2023-06-07 DIAGNOSIS — M722 Plantar fascial fibromatosis: Secondary | ICD-10-CM

## 2023-06-07 DIAGNOSIS — B353 Tinea pedis: Secondary | ICD-10-CM

## 2023-06-07 DIAGNOSIS — D2372 Other benign neoplasm of skin of left lower limb, including hip: Secondary | ICD-10-CM

## 2023-06-07 DIAGNOSIS — M2042 Other hammer toe(s) (acquired), left foot: Secondary | ICD-10-CM | POA: Diagnosis not present

## 2023-06-07 DIAGNOSIS — D2371 Other benign neoplasm of skin of right lower limb, including hip: Secondary | ICD-10-CM | POA: Diagnosis not present

## 2023-06-07 DIAGNOSIS — L603 Nail dystrophy: Secondary | ICD-10-CM | POA: Diagnosis not present

## 2023-06-07 MED ORDER — TERBINAFINE HCL 250 MG PO TABS: 250.0000 mg | ORAL_TABLET | Freq: Every day | ORAL | 0 refills | Status: DC

## 2023-06-07 NOTE — Progress Notes (Signed)
Subjective:  Patient ID: Jane Morrison, female    DOB: [birth year],  MRN: [id number] HPI Chief Complaint  Patient presents with   Toe Pain    5th toes bilateral - corns x years, tried OTC corn remover-no help, tender   Skin Problem    Skin - peeling, callused areas, tried lotions   Foot Pain    Arch left - knots x years, feels like they are getting harder, not sore, but uncomfortable   Diabetes    Last A1c was 10.4   New Patient (Initial Visit)    62 y.o. female presents with the above complaint.   ROS: Denies fever chills nausea vomit muscle aches pains calf pain back pain chest pain shortness of breath.  Past Medical History:  Diagnosis Date   Anxiety    CHF (congestive heart failure) (HCC)    Depression    Diabetes mellitus without complication (HCC)    Hyperlipidemia    Hypertension    Past Surgical History:  Procedure Laterality Date   COLONOSCOPY WITH PROPOFOL N/A 06/29/2022   Procedure: COLONOSCOPY WITH PROPOFOL;  Surgeon: Wyline Mood, MD;  Location: [facility] ENDOSCOPY;  Service: Gastroenterology;  Laterality: N/A;   RIGHT/LEFT HEART CATH AND CORONARY ANGIOGRAPHY N/A 04/03/2023   Procedure: RIGHT/LEFT HEART CATH AND CORONARY ANGIOGRAPHY;  Surgeon: Marcina Millard, MD;  Location: [facility] INVASIVE CV LAB;  Service: Cardiovascular;  Laterality: N/A;   TUBAL LIGATION      Current Outpatient Medications:    losartan (COZAAR) 25 MG tablet, Take 25 mg by mouth daily., Disp: , Rfl:    terbinafine (LAMISIL) 250 MG tablet, Take 1 tablet (250 mg total) by mouth daily., Disp: 30 tablet, Rfl: 0   Accu-Chek Softclix Lancets lancets, Use as instructed (Patient not taking: Reported on 05/17/2023), Disp: 100 each, Rfl: 12   aspirin 81 MG chewable tablet, Chew 1 tablet (81 mg total) by mouth daily., Disp: 90 tablet, Rfl: 0   blood glucose meter kit and supplies KIT, Dispense based on patient and insurance preference. Use up to four times daily as directed. (FOR ICD-9 250.00, 250.01).  (Patient not taking: Reported on 03/22/2023), Disp: 1 each, Rfl: 0   empagliflozin (JARDIANCE) 10 MG TABS tablet, Take 1 tablet (10 mg total) by mouth daily before breakfast., Disp: 30 tablet, Rfl: 1   Glucagon (GVOKE HYPOPEN 1-PACK) 0.5 MG/0.1ML SOAJ, Inject 0.5 mg into the skin once as needed for up to 1 dose (for hypoglycemia)., Disp: 0.1 mL, Rfl: 3   Glucose Blood (BLOOD GLUCOSE TEST STRIPS) STRP, Use as directed to monitor FSBS once daily for DM (Patient not taking: Reported on 03/22/2023), Disp: 100 strip, Rfl: 3   insulin degludec (TRESIBA FLEXTOUCH) 100 UNIT/ML FlexTouch Pen, Inject 10 Units into the skin daily. (Patient taking differently: Inject 12 Units into the skin daily.), Disp: 3 mL, Rfl: 3   Insulin Pen Needle (BD PEN NEEDLE MICRO U/F) 32G X 6 MM MISC, Use to administer insulin, Disp: 100 each, Rfl: 0   metFORMIN (GLUCOPHAGE) 500 MG tablet, Take 1 tablet (500 mg total) by mouth 2 (two) times daily with a meal., Disp: 180 tablet, Rfl: 0   metoprolol succinate (TOPROL-XL) 25 MG 24 hr tablet, Take 1 tablet (25 mg total) by mouth daily., Disp: 30 tablet, Rfl: 2   sacubitril-valsartan (ENTRESTO) 24-26 MG, Take 1 tablet by mouth 2 (two) times daily., Disp: 60 tablet, Rfl: 4   spironolactone (ALDACTONE) 25 MG tablet, Take 0.5 tablets (12.5 mg total) by mouth daily., Disp: 30  tablet, Rfl: 1   varenicline (CHANTIX) 1 MG tablet, TAKE 1 TABLET BY MOUTH TWICE A DAY, Disp: 180 tablet, Rfl: 1  No Known Allergies Review of Systems Objective:  There were no vitals filed for this visit.  General: Well developed, nourished, in no acute distress, alert and oriented x3   Dermatological: Skin is warm, dry and supple bilateral. Nails x 10 are well maintained though they do appear to be thickened and dystrophic possibly mycotic; remaining integument appears unremarkable at this time. There are no open sores, no preulcerative lesions, rashes present to the plantar aspect of the bilateral foot with small  petechial lesions and peeling of the skin.  Vascular: Dorsalis Pedis artery and Posterior Tibial artery pedal pulses are 2/4 bilateral with immedate capillary fill time. Pedal hair growth present. No varicosities and no lower extremity edema present bilateral.   Neruologic: Grossly intact via light touch bilateral. Vibratory intact via tuning fork bilateral. Protective threshold with Semmes Wienstein monofilament intact to all pedal sites bilateral. Patellar and Achilles deep tendon reflexes 2+ bilateral. No Babinski or clonus noted bilateral.   Musculoskeletal: No gross boney pedal deformities bilateral. No pain, crepitus, or limitation noted with foot and ankle range of motion bilateral. Muscular strength 5/5 in all groups tested bilateral.  Plantar fibromas to the plantar aspect of the bilateral foot.  She also has adductovarus rotated hammertoe deformities with benign skin lesions to the dorsal PIPJ's.  Gait: Unassisted, Nonantalgic.    Radiographs:  Radiographs taken today demonstrate osseously mature individual.  Moderately demineralized bone no significant osseous abnormalities she does have adductovarus rotated hammertoe deformities with fusion of the fifth DIPJ left over right  Assessment & Plan:   Assessment: Last hemoglobin A1c was at 10.4.  Hammertoe deformity fifth bilateral with benign skin lesions.  Plantar fibromas plantar aspect of the bilateral foot.  Tinea pedis and mycotic nails.  Plan: Samples of the skin and nail were taken today.  This will be sent for pathologic evaluation.  I started her on Lamisil secondary to the tinea pedis.  30 days no refills I debrided all her benign skin lesions for her..  I did not inject her simply because of her high blood sugar.  We did not discuss surgical intervention at this time.     Norina Cowper T. [place], [place]

## 2023-06-21 ENCOUNTER — Ambulatory Visit: Admission: RE | Admit: 2023-06-21 | Payer: 59 | Source: Ambulatory Visit

## 2023-06-21 ENCOUNTER — Ambulatory Visit: Payer: 59 | Attending: Family | Admitting: Family

## 2023-06-21 ENCOUNTER — Other Ambulatory Visit: Payer: Self-pay

## 2023-06-21 ENCOUNTER — Encounter: Payer: Self-pay | Admitting: Family

## 2023-06-21 VITALS — BP 149/89 | HR 75 | Ht 68.0 in | Wt 152.0 lb

## 2023-06-21 DIAGNOSIS — I428 Other cardiomyopathies: Secondary | ICD-10-CM | POA: Diagnosis not present

## 2023-06-21 DIAGNOSIS — I110 Hypertensive heart disease with heart failure: Secondary | ICD-10-CM | POA: Insufficient documentation

## 2023-06-21 DIAGNOSIS — Z794 Long term (current) use of insulin: Secondary | ICD-10-CM | POA: Insufficient documentation

## 2023-06-21 DIAGNOSIS — I509 Heart failure, unspecified: Secondary | ICD-10-CM | POA: Insufficient documentation

## 2023-06-21 DIAGNOSIS — I10 Essential (primary) hypertension: Secondary | ICD-10-CM | POA: Diagnosis not present

## 2023-06-21 DIAGNOSIS — I5022 Chronic systolic (congestive) heart failure: Secondary | ICD-10-CM

## 2023-06-21 DIAGNOSIS — I2510 Atherosclerotic heart disease of native coronary artery without angina pectoris: Secondary | ICD-10-CM | POA: Diagnosis not present

## 2023-06-21 DIAGNOSIS — Z87891 Personal history of nicotine dependence: Secondary | ICD-10-CM | POA: Diagnosis not present

## 2023-06-21 DIAGNOSIS — E1165 Type 2 diabetes mellitus with hyperglycemia: Secondary | ICD-10-CM

## 2023-06-21 DIAGNOSIS — E785 Hyperlipidemia, unspecified: Secondary | ICD-10-CM | POA: Diagnosis not present

## 2023-06-21 DIAGNOSIS — Z79899 Other long term (current) drug therapy: Secondary | ICD-10-CM | POA: Diagnosis not present

## 2023-06-21 DIAGNOSIS — E119 Type 2 diabetes mellitus without complications: Secondary | ICD-10-CM | POA: Insufficient documentation

## 2023-06-21 DIAGNOSIS — Z7984 Long term (current) use of oral hypoglycemic drugs: Secondary | ICD-10-CM | POA: Diagnosis not present

## 2023-06-21 MED ORDER — SACUBITRIL-VALSARTAN 49-51 MG PO TABS
1.0000 | ORAL_TABLET | Freq: Two times a day (BID) | ORAL | 5 refills | Status: DC
  Filled 2023-06-21: qty 60, 30d supply, fill #0
  Filled 2023-07-20: qty 60, 30d supply, fill #1
  Filled 2023-09-01: qty 60, 30d supply, fill #2
  Filled 2023-10-04: qty 60, 30d supply, fill #3
  Filled 2023-11-21: qty 60, 30d supply, fill #4
  Filled 2023-12-06: qty 60, 30d supply, fill #5

## 2023-06-21 NOTE — Patient Instructions (Addendum)
I have sent the new entresto prescription to the [facility] pharmacy. You will still take this as 1 tablet twice daily.

## 2023-06-21 NOTE — Progress Notes (Signed)
PCP: Berniece Salines, FNP (last seen 05/24) Cardiologist: Marcina Millard, MD (last seen during recent admission)  HPI:  Ms Matusek is a 62 y/o female with a history of DM, hyperlipidemia, HTN, anxiety, depression, CAD, tobacco use & chronic heart failure (newly diagnosed)  Echo 03/30/23: EF 25-30% with mild/ moderate LVH consistent with Grade I DD and mild MR.   RHC/LHC 04/03/23:   Prox RCA to Mid RCA lesion is 70% stenosed.   Dist RCA lesion is 50% stenosed.   Ost LAD to Prox LAD lesion is 30% stenosed.   There is moderate left ventricular systolic dysfunction.   The left ventricular ejection fraction is 35-45% by visual estimate.   1.  Normal right sided heart pressures (PA 25/7 (13), PCWP 6/4) 2.  Mild to moderate coronary artery disease with 70% stenosis mid RCA 3.  Moderately reduced left ventricular function with estimated LV ejection fraction 35-45%  Was in the ED 04/27/23 after a mechanical fall after she tripped while at work at [employer]. Hit her head on the ground but no LOC. Negative imaging. Admitted 03/30/23 due to acute onset of worsening dyspnea, associated orthopnea, chest pressure and cough productive of clear sputum as well as wheezing. Diuresed with IV lasix. NTG drip started and cardiology consulted. Initially needed bipap due to hypoxia but able to be weaned off to room air. Cath done per above. Was in the ED 02/20/23 due to acute cough.    She presents today with a chief complaint of a HF follow-up visit. Sleeping well. Denies fatigue, SOB, chest pain, palpitations, abdominal distention, pedal edema, dizziness or weight gain. Home weight chart shows weight of 149-152 pounds.   Recent death of her kids husband yesterday at hospice home and she felt some anxiety / chest tightness with this.   At last visit, entresto was to be increased to the 49/51mg  dose but it looks like the 24/26mg  dose was refilled instead. She has continued to take 24/26mg  dose BID.   ROS:  All systems negative except as listed in HPI, PMH and Problem List.  SH:  Social History   Socioeconomic History   Marital status: Married    Spouse name: Not on file   Number of children: 3   Years of education: Not on file   Highest education level: Associate degree: academic program  Occupational History   Not on file  Tobacco Use   Smoking status: Former    Packs/day: 0.50    Years: 40.00    Additional pack years: 0.00    Total pack years: 20.00    Types: Cigarettes    Quit date: 03/29/2023    Years since quitting: 0.2    Passive exposure: Past   Smokeless tobacco: Never  Vaping Use   Vaping Use: Never used  Substance and Sexual Activity   Alcohol use: No   Drug use: No   Sexual activity: Yes  Other Topics Concern   Not on file  Social History Narrative   3 biological kids 4 stepchildren   Social Determinants of Health   Financial Resource Strain: Low Risk  (03/21/2023)   Overall Financial Resource Strain (CARDIA)    Difficulty of Paying Living Expenses: Not hard at all  Food Insecurity: Unknown (03/21/2023)   Hunger Vital Sign    Worried About Running Out of Food in the Last Year: Patient declined    Ran Out of Food in the Last Year: Never true  Transportation Needs: No Transportation Needs (03/21/2023)  PRAPARE - Administrator, Civil Service (Medical): No    Lack of Transportation (Non-Medical): No  Physical Activity: Insufficiently Active (03/21/2023)   Exercise Vital Sign    Days of Exercise per Week: 3 days    Minutes of Exercise per Session: 10 min  Stress: Patient Declined (03/21/2023)   [employer] of Occupational Health - Occupational Stress Questionnaire    Feeling of Stress : Patient declined  Social Connections: Socially Integrated (03/21/2023)   Social Connection and Isolation Panel [NHANES]    Frequency of Communication with Friends and Family: Once a week    Frequency of Social Gatherings with Friends and Family: More than three times  a week    Attends Religious Services: More than 4 times per year    Active Member of [employer] or Organizations: Yes    Attends Banker Meetings: 1 to 4 times per year    Marital Status: Married  Catering manager Violence: Not on file    FH:  Family History  Problem Relation Age of Onset   Hypertension Mother    Diabetes Mother    Hypertension Father    Diabetes Father    Breast cancer Sister 30    Past Medical History:  Diagnosis Date   Anxiety    CHF (congestive heart failure) (HCC)    Depression    Diabetes mellitus without complication (HCC)    Hyperlipidemia    Hypertension     Current Outpatient Medications  Medication Sig Dispense Refill   Accu-Chek Softclix Lancets lancets Use as instructed (Patient not taking: Reported on 05/17/2023) 100 each 12   aspirin 81 MG chewable tablet Chew 1 tablet (81 mg total) by mouth daily. 90 tablet 0   blood glucose meter kit and supplies KIT Dispense based on patient and insurance preference. Use up to four times daily as directed. (FOR ICD-9 250.00, 250.01). (Patient not taking: Reported on 03/22/2023) 1 each 0   empagliflozin (JARDIANCE) 10 MG TABS tablet Take 1 tablet (10 mg total) by mouth daily before breakfast. 30 tablet 1   Glucagon (GVOKE HYPOPEN 1-PACK) 0.5 MG/0.1ML SOAJ Inject 0.5 mg into the skin once as needed for up to 1 dose (for hypoglycemia). 0.1 mL 3   Glucose Blood (BLOOD GLUCOSE TEST STRIPS) STRP Use as directed to monitor FSBS once daily for DM (Patient not taking: Reported on 03/22/2023) 100 strip 3   insulin degludec (TRESIBA FLEXTOUCH) 100 UNIT/ML FlexTouch Pen Inject 10 Units into the skin daily. (Patient taking differently: Inject 12 Units into the skin daily.) 3 mL 3   Insulin Pen Needle (BD PEN NEEDLE MICRO U/F) 32G X 6 MM MISC Use to administer insulin 100 each 0   losartan (COZAAR) 25 MG tablet Take 25 mg by mouth daily.     metFORMIN (GLUCOPHAGE) 500 MG tablet Take 1 tablet (500 mg total) by mouth 2  (two) times daily with a meal. 180 tablet 0   metoprolol succinate (TOPROL-XL) 25 MG 24 hr tablet Take 1 tablet (25 mg total) by mouth daily. 30 tablet 2   sacubitril-valsartan (ENTRESTO) 24-26 MG Take 1 tablet by mouth 2 (two) times daily. 60 tablet 4   spironolactone (ALDACTONE) 25 MG tablet Take 0.5 tablets (12.5 mg total) by mouth daily. 30 tablet 1   terbinafine (LAMISIL) 250 MG tablet Take 1 tablet (250 mg total) by mouth daily. 30 tablet 0   varenicline (CHANTIX) 1 MG tablet TAKE 1 TABLET BY MOUTH TWICE A DAY 180 tablet  1   No current facility-administered medications for this visit.   Vitals:   06/21/23 0847  BP: (!) 149/89  Pulse: 75  SpO2: 100%  Weight: 152 lb (68.9 kg)  Height: 5\' 8"  (1.727 m)   Wt Readings from Last 3 Encounters:  06/21/23 152 lb (68.9 kg)  05/17/23 153 lb (69.4 kg)  04/26/23 152 lb 6.4 oz (69.1 kg)   Lab Results  Component Value Date   CREATININE 1.13 (H) 05/17/2023   CREATININE 1.03 04/17/2023   CREATININE 1.10 (H) 04/03/2023     PHYSICAL EXAM:  General:  Well appearing. No resp difficulty HEENT: normal Neck: supple. JVP flat.;no bruits.No thyromegaly or lymphadenopathy appreciated. Cor: PMI normal. Regular rate & rhythm. No rubs, gallops or murmurs. Lungs: clear Abdomen: soft, nontender, nondistended. No hepatosplenomegaly. No bruits or masses. Good bowel sounds. Extremities: no cyanosis, clubbing, rash, edema Neuro: alert & oriented x3, cranial nerves grossly intact. Moves all 4 extremities w/o difficulty. Affect pleasant.   ECG: not done   ASSESSMENT & PLAN:  1: NICM with reduced ejection fraction- - etiology likely HTN/ DM - NYHA class I - euvolemic - weighing daily and ranges from 149-152 pounds at home; reminded to call for an overnight weight gain of > 2 pounds or a weekly weight gain of >5 pounds - weight stable from last visit here 1 month ago - Echo 03/30/23: EF 25-30% with mild/ moderate LVH consistent with Grade I DD and  mild MR.  - continue jardiance 10mg  daily  - increase entresto to 49/51mg  BID - continue spironolactone 12.5mg  daily - continue metoprolol succinate 25mg  daily - BMP at next visit with increasing entresto today - BNP 03/30/23 was 471.0  2: HTN- - BP 149/89 but just found out yesterday that her kids father died at hospice home; working on funeral arrangements this morning; emotional support given - saw PCP Zane Herald) 05/24 - BMP 05/17/23 showed sodium 138, potassium 4.4, creatinine 1.13 & GFR 56  3: CAD- - saw cardiology (Paraschos) during recent admission; f/u appt had been scheduled for 06/14/23 but she says that his office cancelled it and it hasn't been r/s yet - RHC/LHC 04/03/23:   Prox RCA to Mid RCA lesion is 70% stenosed.   Dist RCA lesion is 50% stenosed.   Ost LAD to Prox LAD lesion is 30% stenosed.   There is moderate left ventricular systolic dysfunction.   The left ventricular ejection fraction is 35-45% by visual estimate.  1.  Normal right sided heart pressures (PA 25/7 (13), PCWP 6/4) 2.  Mild to moderate coronary artery disease with 70% stenosis mid RCA 3.  Moderately reduced left ventricular function with estimated LV ejection fraction 35-45%  4: DM- - A1c 03/22/23 was 10.4%  Return in 1 month, sooner if needed.

## 2023-06-27 ENCOUNTER — Other Ambulatory Visit: Payer: Self-pay | Admitting: Nurse Practitioner

## 2023-06-27 ENCOUNTER — Other Ambulatory Visit: Payer: Self-pay

## 2023-06-27 DIAGNOSIS — E1165 Type 2 diabetes mellitus with hyperglycemia: Secondary | ICD-10-CM

## 2023-06-28 ENCOUNTER — Other Ambulatory Visit: Payer: Self-pay

## 2023-06-28 MED ORDER — EMPAGLIFLOZIN 10 MG PO TABS: 10.0000 mg | ORAL_TABLET | Freq: Every day | ORAL | 1 refills | Status: DC

## 2023-06-28 NOTE — Telephone Encounter (Signed)
Requested Prescriptions  Pending Prescriptions Disp Refills   metFORMIN (GLUCOPHAGE) 500 MG tablet [Pharmacy Med Name: METFORMIN HCL 500 MG TABLET] 60 tablet 2    Sig: TAKE 1 TABLET BY MOUTH 2 TIMES DAILY WITH A MEAL.     Endocrinology:  Diabetes - Biguanides Failed - 06/27/2023  2:18 AM      Failed - Cr in normal range and within 360 days    Creat  Date Value Ref Range Status  04/17/2023 1.03 0.50 - 1.03 mg/dL Final   Creatinine, Ser  Date Value Ref Range Status  05/17/2023 1.13 (H) 0.44 - 1.00 mg/dL Final   Creatinine, Urine  Date Value Ref Range Status  03/22/2023 105 20 - 275 mg/dL Final         Failed - HBA1C is between 0 and 7.9 and within 180 days    Hgb A1c MFr Bld  Date Value Ref Range Status  03/22/2023 10.4 (H) <5.7 % of total Hgb Final    Comment:    For someone without known diabetes, a hemoglobin A1c value of 6.5% or greater indicates that they may have  diabetes and this should be confirmed with a follow-up  test. . For someone with known diabetes, a value <7% indicates  that their diabetes is well controlled and a value  greater than or equal to 7% indicates suboptimal  control. A1c targets should be individualized based on  duration of diabetes, age, comorbid conditions, and  other considerations. . Currently, no consensus exists regarding use of hemoglobin A1c for diagnosis of diabetes for children. .          Failed - eGFR in normal range and within 360 days    EGFR (African American)  Date Value Ref Range Status  11/05/2012 >60  Final   GFR calc Af Amer  Date Value Ref Range Status  07/07/2017 >60 >60 mL/min Final    Comment:    (NOTE) The eGFR has been calculated using the CKD EPI equation. This calculation has not been validated in all clinical situations. eGFR's persistently <60 mL/min signify possible Chronic Kidney Disease.    EGFR (Non-African Amer.)  Date Value Ref Range Status  11/05/2012 >60  Final    Comment:    eGFR values  <55mL/min/1.73 m2 may be an indication of chronic kidney disease (CKD). Calculated eGFR is useful in patients with stable renal function. The eGFR calculation will not be reliable in acutely ill patients when serum creatinine is changing rapidly. It is not useful in  patients on dialysis. The eGFR calculation may not be applicable to patients at the low and high extremes of body sizes, pregnant women, and vegetarians.    GFR, Estimated  Date Value Ref Range Status  05/17/2023 56 (L) >60 mL/min Final    Comment:    (NOTE) Calculated using the CKD-EPI Creatinine Equation (2021)    eGFR  Date Value Ref Range Status  03/22/2023 63 > OR = 60 mL/min/1.30m2 Final         Failed - B12 Level in normal range and within 720 days    No results found for: "VITAMINB12"       Passed - Valid encounter within last 6 months    Recent Outpatient Visits           2 months ago Hospital discharge follow-up   [facility] Berniece Salines, FNP   3 months ago Tobacco dependence   [facility] Della Goo  F, FNP   1 year ago Weight loss   [facility] Berniece Salines, FNP       Future Appointments             In 4 weeks Zane Herald, Rudolpho Sevin, FNP [facility], [facility]            Passed - CBC within normal limits and completed in the last 12 months    WBC  Date Value Ref Range Status  04/17/2023 6.3 3.8 - 10.8 Thousand/uL Final   RBC  Date Value Ref Range Status  04/17/2023 4.40 3.80 - 5.10 Million/uL Final   Hemoglobin  Date Value Ref Range Status  04/17/2023 13.3 11.7 - 15.5 g/dL Final   HGB  Date Value Ref Range Status  11/05/2012 15.3 12.0 - 16.0 g/dL Final   HCT  Date Value Ref Range Status  04/17/2023 39.1 35.0 - 45.0 % Final  11/05/2012 43.2 35.0 - 47.0 % Final   MCHC  Date Value Ref Range Status  04/17/2023 34.0 32.0 - 36.0 g/dL Final   [facility]  Date Value Ref Range  Status  04/17/2023 30.2 27.0 - 33.0 pg Final   MCV  Date Value Ref Range Status  04/17/2023 88.9 80.0 - 100.0 fL Final  11/05/2012 97 80 - 100 fL Final   No results found for: "PLTCOUNTKUC", "LABPLAT", "POCPLA" RDW  Date Value Ref Range Status  04/17/2023 12.9 11.0 - 15.0 % Final  11/05/2012 13.4 11.5 - 14.5 % Final

## 2023-06-28 NOTE — Telephone Encounter (Signed)
Requested medication (s) are due for refill today:yes  Requested medication (s) are on the active medication list: yes  Last refill:  04/26/23 #30 1 RF  Future visit scheduled: yes  Notes to clinic:  last Hgb A1C out of range    Requested Prescriptions  Pending Prescriptions Disp Refills   empagliflozin (JARDIANCE) 10 MG TABS tablet 30 tablet 1    Sig: Take 1 tablet (10 mg total) by mouth daily before breakfast.     Endocrinology:  Diabetes - SGLT2 Inhibitors Failed - 06/27/2023  2:30 PM      Failed - Cr in normal range and within 360 days    Creat  Date Value Ref Range Status  04/17/2023 1.03 0.50 - 1.03 mg/dL Final   Creatinine, Ser  Date Value Ref Range Status  05/17/2023 1.13 (H) 0.44 - 1.00 mg/dL Final   Creatinine, Urine  Date Value Ref Range Status  03/22/2023 105 20 - 275 mg/dL Final         Failed - HBA1C is between 0 and 7.9 and within 180 days    Hgb A1c MFr Bld  Date Value Ref Range Status  03/22/2023 10.4 (H) <5.7 % of total Hgb Final    Comment:    For someone without known diabetes, a hemoglobin A1c value of 6.5% or greater indicates that they may have  diabetes and this should be confirmed with a follow-up  test. . For someone with known diabetes, a value <7% indicates  that their diabetes is well controlled and a value  greater than or equal to 7% indicates suboptimal  control. A1c targets should be individualized based on  duration of diabetes, age, comorbid conditions, and  other considerations. . Currently, no consensus exists regarding use of hemoglobin A1c for diagnosis of diabetes for children. .          Failed - eGFR in normal range and within 360 days    EGFR (African American)  Date Value Ref Range Status  11/05/2012 >60  Final   GFR calc Af Amer  Date Value Ref Range Status  07/07/2017 >60 >60 mL/min Final    Comment:    (NOTE) The eGFR has been calculated using the CKD EPI equation. This calculation has not been validated in  all clinical situations. eGFR's persistently <60 mL/min signify possible Chronic Kidney Disease.    EGFR (Non-African Amer.)  Date Value Ref Range Status  11/05/2012 >60  Final    Comment:    eGFR values <14mL/min/1.73 m2 may be an indication of chronic kidney disease (CKD). Calculated eGFR is useful in patients with stable renal function. The eGFR calculation will not be reliable in acutely ill patients when serum creatinine is changing rapidly. It is not useful in  patients on dialysis. The eGFR calculation may not be applicable to patients at the low and high extremes of body sizes, pregnant women, and vegetarians.    GFR, Estimated  Date Value Ref Range Status  05/17/2023 56 (L) >60 mL/min Final    Comment:    (NOTE) Calculated using the CKD-EPI Creatinine Equation (2021)    eGFR  Date Value Ref Range Status  03/22/2023 63 > OR = 60 mL/min/1.85m2 Final         Passed - Valid encounter within last 6 months    Recent Outpatient Visits           2 months ago Hospital discharge follow-up   [facility] Berniece Salines, [place]  3 months ago Tobacco dependence   [facility] Berniece Salines, FNP   1 year ago Weight loss   [facility] Berniece Salines, FNP       Future Appointments             In 4 weeks Zane Herald, Rudolpho Sevin, FNP [facility], [facility]

## 2023-07-03 ENCOUNTER — Other Ambulatory Visit: Payer: Self-pay | Admitting: Nurse Practitioner

## 2023-07-03 DIAGNOSIS — E1165 Type 2 diabetes mellitus with hyperglycemia: Secondary | ICD-10-CM

## 2023-07-03 DIAGNOSIS — F17200 Nicotine dependence, unspecified, uncomplicated: Secondary | ICD-10-CM

## 2023-07-03 NOTE — Telephone Encounter (Signed)
Medication Refill - Medication:  varenicline (CHANTIX) 1 MG tablet  Insulin Pen Needle (BD PEN NEEDLE MICRO U/F) 32G X 6 MM MISC  empagliflozin (JARDIANCE) 10 MG TABS tablet (SENT TO WRONG PHARMACY)  Has the patient contacted their pharmacy? Yes.     Preferred Pharmacy (with phone number or street name):  empagliflozin (JARDIANCE) 10 MG TABS tablet ONLY TO PHARMACY BELOW:   [place] [facility]  Phone: [phone] Fax: [phone]  ------------------------------------------------------- varenicline (CHANTIX) 1 MG tablet  Insulin Pen Needle (BD PEN NEEDLE MICRO U/F) 32G X 6 MM MISC TO PHARMACY BELOW   [id number] - [place], [place] - [address]  Phone: [phone] Fax: [phone]   ----------------------------------------- Has the patient been seen for an appointment in the last year OR does the patient have an upcoming appointment? Yes.  F/U on 8.14.2024

## 2023-07-04 ENCOUNTER — Other Ambulatory Visit: Payer: Self-pay

## 2023-07-04 ENCOUNTER — Other Ambulatory Visit: Payer: Self-pay | Admitting: Nurse Practitioner

## 2023-07-04 MED ORDER — EMPAGLIFLOZIN 10 MG PO TABS
10.0000 mg | ORAL_TABLET | Freq: Every day | ORAL | 1 refills | Status: DC
  Filled 2023-07-04 – 2023-09-01 (×2): qty 30, 30d supply, fill #0

## 2023-07-04 NOTE — Telephone Encounter (Signed)
Requested Prescriptions  Pending Prescriptions Disp Refills   empagliflozin (JARDIANCE) 10 MG TABS tablet 30 tablet 1    Sig: Take 1 tablet (10 mg total) by mouth daily before breakfast.     Endocrinology:  Diabetes - SGLT2 Inhibitors Failed - 07/04/2023  4:40 PM      Failed - Cr in normal range and within 360 days    Creat  Date Value Ref Range Status  04/17/2023 1.03 0.50 - 1.03 mg/dL Final   Creatinine, Ser  Date Value Ref Range Status  05/17/2023 1.13 (H) 0.44 - 1.00 mg/dL Final   Creatinine, Urine  Date Value Ref Range Status  03/22/2023 105 20 - 275 mg/dL Final         Failed - HBA1C is between 0 and 7.9 and within 180 days    Hgb A1c MFr Bld  Date Value Ref Range Status  03/22/2023 10.4 (H) <5.7 % of total Hgb Final    Comment:    For someone without known diabetes, a hemoglobin A1c value of 6.5% or greater indicates that they may have  diabetes and this should be confirmed with a follow-up  test. . For someone with known diabetes, a value <7% indicates  that their diabetes is well controlled and a value  greater than or equal to 7% indicates suboptimal  control. A1c targets should be individualized based on  duration of diabetes, age, comorbid conditions, and  other considerations. . Currently, no consensus exists regarding use of hemoglobin A1c for diagnosis of diabetes for children. .          Failed - eGFR in normal range and within 360 days    EGFR (African American)  Date Value Ref Range Status  11/05/2012 >60  Final   GFR calc Af Amer  Date Value Ref Range Status  07/07/2017 >60 >60 mL/min Final    Comment:    (NOTE) The eGFR has been calculated using the CKD EPI equation. This calculation has not been validated in all clinical situations. eGFR's persistently <60 mL/min signify possible Chronic Kidney Disease.    EGFR (Non-African Amer.)  Date Value Ref Range Status  11/05/2012 >60  Final    Comment:    eGFR values <4mL/min/1.73 m2 may be  an indication of chronic kidney disease (CKD). Calculated eGFR is useful in patients with stable renal function. The eGFR calculation will not be reliable in acutely ill patients when serum creatinine is changing rapidly. It is not useful in  patients on dialysis. The eGFR calculation may not be applicable to patients at the low and high extremes of body sizes, pregnant women, and vegetarians.    GFR, Estimated  Date Value Ref Range Status  05/17/2023 56 (L) >60 mL/min Final    Comment:    (NOTE) Calculated using the CKD-EPI Creatinine Equation (2021)    eGFR  Date Value Ref Range Status  03/22/2023 63 > OR = 60 mL/min/1.55m2 Final         Passed - Valid encounter within last 6 months    Recent Outpatient Visits           2 months ago Hospital discharge follow-up   [facility] Berniece Salines, FNP   3 months ago Tobacco dependence   [facility] Berniece Salines, FNP   1 year ago Weight loss   [facility] Berniece Salines, [place]       Future Appointments  In 3 weeks Berniece Salines, FNP [facility], [facility]            Refused Prescriptions Disp Refills   varenicline (CHANTIX) 1 MG tablet 180 tablet 1    Sig: Take 1 tablet (1 mg total) by mouth 2 (two) times daily.     Psychiatry:  Drug Dependence Therapy - varenicline Failed - 07/04/2023  4:40 PM      Failed - Manual Review: Do not refill starter pack. 1 mg tabs may be extended up to one year if the patient has quit smoking but still feels at risk for relapse.      Failed - Cr in normal range and within 180 days    Creat  Date Value Ref Range Status  04/17/2023 1.03 0.50 - 1.03 mg/dL Final   Creatinine, Ser  Date Value Ref Range Status  05/17/2023 1.13 (H) 0.44 - 1.00 mg/dL Final   Creatinine, Urine  Date Value Ref Range Status  03/22/2023 105 20 - 275 mg/dL Final         Passed - Completed  PHQ-2 or PHQ-9 in the last 360 days      Passed - Valid encounter within last 6 months    Recent Outpatient Visits           2 months ago Hospital discharge follow-up   [facility] Berniece Salines, FNP   3 months ago Tobacco dependence   [facility] Berniece Salines, FNP   1 year ago Weight loss   [facility] Berniece Salines, FNP       Future Appointments             In 3 weeks Zane Herald, Rudolpho Sevin, FNP [facility], [facility]             Insulin Pen Needle (BD PEN NEEDLE MICRO U/F) 32G X 6 MM MISC 100 each 0    Sig: Use to administer insulin     Endocrinology: Diabetes - Testing Supplies Passed - 07/04/2023  4:40 PM      Passed - Valid encounter within last 12 months    Recent Outpatient Visits           2 months ago Hospital discharge follow-up   [facility] Berniece Salines, FNP   3 months ago Tobacco dependence   [facility] Berniece Salines, FNP   1 year ago Weight loss   [facility] Berniece Salines, FNP       Future Appointments             In 3 weeks Zane Herald, Rudolpho Sevin, FNP [facility], [facility]

## 2023-07-04 NOTE — Telephone Encounter (Signed)
empagliflozin (JARDIANCE) 10 MG TABS tablet ONLY TO PHARMACY BELOW:    [place] [facility]    Phone:[phone] Fax:[phone]

## 2023-07-04 NOTE — Telephone Encounter (Signed)
Requested Prescriptions  Refused Prescriptions Disp Refills   varenicline (CHANTIX) 1 MG tablet 180 tablet 1    Sig: Take 1 tablet (1 mg total) by mouth 2 (two) times daily.     Psychiatry:  Drug Dependence Therapy - varenicline Failed - 07/04/2023  8:23 AM      Failed - Manual Review: Do not refill starter pack. 1 mg tabs may be extended up to one year if the patient has quit smoking but still feels at risk for relapse.      Failed - Cr in normal range and within 180 days    Creat  Date Value Ref Range Status  04/17/2023 1.03 0.50 - 1.03 mg/dL Final   Creatinine, Ser  Date Value Ref Range Status  05/17/2023 1.13 (H) 0.44 - 1.00 mg/dL Final   Creatinine, Urine  Date Value Ref Range Status  03/22/2023 105 20 - 275 mg/dL Final         Passed - Completed PHQ-2 or PHQ-9 in the last 360 days      Passed - Valid encounter within last 6 months    Recent Outpatient Visits           2 months ago Hospital discharge follow-up   [facility] Berniece Salines, FNP   3 months ago Tobacco dependence   [facility] Berniece Salines, FNP   1 year ago Weight loss   [facility] Berniece Salines, FNP       Future Appointments             In 3 weeks Zane Herald, Rudolpho Sevin, FNP [facility], [facility]             Insulin Pen Needle (BD PEN NEEDLE MICRO U/F) 32G X 6 MM MISC 100 each 0    Sig: Use to administer insulin     Endocrinology: Diabetes - Testing Supplies Passed - 07/04/2023  8:23 AM      Passed - Valid encounter within last 12 months    Recent Outpatient Visits           2 months ago Hospital discharge follow-up   [facility] Berniece Salines, FNP   3 months ago Tobacco dependence   [facility] Berniece Salines, FNP   1 year ago Weight loss   [facility] Berniece Salines, FNP       Future Appointments              In 3 weeks Zane Herald, Rudolpho Sevin, FNP [facility], [facility]

## 2023-07-04 NOTE — Telephone Encounter (Signed)
  empagliflozin (JARDIANCE) 10 MG TABS tablet ONLY TO PHARMACY BELOW:    [place] [facility]    Phone:[phone] Fax:[phone]

## 2023-07-04 NOTE — Telephone Encounter (Signed)
varenicline (CHANTIX) 1 MG tablet   Insulin Pen Needle (BD PEN NEEDLE MICRO U/F) 32G X 6 MM MISC TO PHARMACY BELOW     [id number] - [place], [place] - [address]   [phone] Fax:[phone]

## 2023-07-05 ENCOUNTER — Ambulatory Visit (INDEPENDENT_AMBULATORY_CARE_PROVIDER_SITE_OTHER): Payer: 59 | Admitting: Podiatry

## 2023-07-05 DIAGNOSIS — B353 Tinea pedis: Secondary | ICD-10-CM | POA: Diagnosis not present

## 2023-07-05 DIAGNOSIS — M722 Plantar fascial fibromatosis: Secondary | ICD-10-CM

## 2023-07-05 DIAGNOSIS — L603 Nail dystrophy: Secondary | ICD-10-CM

## 2023-07-05 MED ORDER — TERBINAFINE HCL 250 MG PO TABS: 250.0000 mg | ORAL_TABLET | Freq: Every day | ORAL | 0 refills | Status: DC

## 2023-07-05 NOTE — Progress Notes (Signed)
She presents today for follow-up of pain to her left arch.  And her nail fungus.  She states that her skin is doing much better with the athlete's foot.  Her hemoglobin A1c is is still a 10.4 and her circulating blood sugar is 210 mg/dL.  She denies having any problems taking the Lamisil.  Objective: Vital signs are stable alert and oriented x 3 she has painful plantar fibromas medial and jejunal arch left foot over right.  Tinea pedis is slowly resolving.  Onychomycosis is present.  Assessment: Tinea pedis is resolving onychomycosis.  Plantar fibromas bilateral.  Plan: Secondary to her elevated hemoglobin A1c I am not going to inject her plantar fibromas.  I am going to have a compound made with verapamil that will help shrink the size of these and hopefully render her asymptomatic with this.  I am going to however start her back on Lamisil No. 30 tablets 1 p.o. daily.  I will follow-up with her in about 2 months

## 2023-07-05 NOTE — Telephone Encounter (Signed)
Unable to refill per protocol, Rx request was refilled and went to [facility]. Duplicate request.  Requested Prescriptions  Pending Prescriptions Disp Refills   empagliflozin (JARDIANCE) 10 MG TABS tablet 30 tablet 1    Sig: Take 1 tablet (10 mg total) by mouth daily before breakfast.     Endocrinology:  Diabetes - SGLT2 Inhibitors Failed - 07/04/2023  8:26 AM      Failed - Cr in normal range and within 360 days    Creat  Date Value Ref Range Status  04/17/2023 1.03 0.50 - 1.03 mg/dL Final   Creatinine, Ser  Date Value Ref Range Status  05/17/2023 1.13 (H) 0.44 - 1.00 mg/dL Final   Creatinine, Urine  Date Value Ref Range Status  03/22/2023 105 20 - 275 mg/dL Final         Failed - HBA1C is between 0 and 7.9 and within 180 days    Hgb A1c MFr Bld  Date Value Ref Range Status  03/22/2023 10.4 (H) <5.7 % of total Hgb Final    Comment:    For someone without known diabetes, a hemoglobin A1c value of 6.5% or greater indicates that they may have  diabetes and this should be confirmed with a follow-up  test. . For someone with known diabetes, a value <7% indicates  that their diabetes is well controlled and a value  greater than or equal to 7% indicates suboptimal  control. A1c targets should be individualized based on  duration of diabetes, age, comorbid conditions, and  other considerations. . Currently, no consensus exists regarding use of hemoglobin A1c for diagnosis of diabetes for children. .          Failed - eGFR in normal range and within 360 days    EGFR (African American)  Date Value Ref Range Status  11/05/2012 >60  Final   GFR calc Af Amer  Date Value Ref Range Status  07/07/2017 >60 >60 mL/min Final    Comment:    (NOTE) The eGFR has been calculated using the CKD EPI equation. This calculation has not been validated in all clinical situations. eGFR's persistently <60 mL/min signify possible Chronic Kidney Disease.    EGFR  (Non-African Amer.)  Date Value Ref Range Status  11/05/2012 >60  Final    Comment:    eGFR values <24mL/min/1.73 m2 may be an indication of chronic kidney disease (CKD). Calculated eGFR is useful in patients with stable renal function. The eGFR calculation will not be reliable in acutely ill patients when serum creatinine is changing rapidly. It is not useful in  patients on dialysis. The eGFR calculation may not be applicable to patients at the low and high extremes of body sizes, pregnant women, and vegetarians.    GFR, Estimated  Date Value Ref Range Status  05/17/2023 56 (L) >60 mL/min Final    Comment:    (NOTE) Calculated using the CKD-EPI Creatinine Equation (2021)    eGFR  Date Value Ref Range Status  03/22/2023 63 > OR = 60 mL/min/1.44m2 Final         Passed - Valid encounter within last 6 months    Recent Outpatient Visits           2 months ago Hospital discharge follow-up   [facility] Berniece Salines, FNP   3 months ago Tobacco dependence   [facility] Berniece Salines, FNP   1 year ago Weight loss   [facility] Berniece Salines, FNP       Future Appointments             In 3 weeks Zane Herald, Rudolpho Sevin, FNP [facility], [facility]

## 2023-07-07 ENCOUNTER — Other Ambulatory Visit: Payer: Self-pay | Admitting: Nurse Practitioner

## 2023-07-07 DIAGNOSIS — F17200 Nicotine dependence, unspecified, uncomplicated: Secondary | ICD-10-CM

## 2023-07-07 DIAGNOSIS — E1165 Type 2 diabetes mellitus with hyperglycemia: Secondary | ICD-10-CM

## 2023-07-07 NOTE — Telephone Encounter (Signed)
Medication Refill - Medication: spironolactone (ALDACTONE) 25 MG tablet [[id number]]   varenicline (CHANTIX) 1 MG tablet [[id number]]   Insulin Pen Needle (BD PEN NEEDLE MICRO U/F) 32G X 6 MM MISC [[id number]]   Has the patient contacted their pharmacy? Yes.   (Agent: If no, request that the patient contact the pharmacy for the refill. If patient does not wish to contact the pharmacy document the reason why and proceed with request.) (Agent: If yes, when and what did the pharmacy advise?)  Preferred Pharmacy (with phone number or street name):  [id number] [place], [place] - [address] Phone: [phone]  Fax: [phone]     Has the patient been seen for an appointment in the last year OR does the patient have an upcoming appointment? Yes.    Agent: Please be advised that RX refills may take up to 3 business days. We ask that you follow-up with your pharmacy.

## 2023-07-10 MED ORDER — SPIRONOLACTONE 25 MG PO TABS: 12.5000 mg | ORAL_TABLET | Freq: Every day | ORAL | 0 refills | Status: DC

## 2023-07-10 NOTE — Telephone Encounter (Signed)
Requested Prescriptions  Pending Prescriptions Disp Refills   spironolactone (ALDACTONE) 25 MG tablet 45 tablet 0    Sig: Take 0.5 tablets (12.5 mg total) by mouth daily.     Cardiovascular: Diuretics - Aldosterone Antagonist Failed - 07/10/2023  8:21 AM      Failed - Cr in normal range and within 180 days    Creat  Date Value Ref Range Status  04/17/2023 1.03 0.50 - 1.03 mg/dL Final   Creatinine, Ser  Date Value Ref Range Status  05/17/2023 1.13 (H) 0.44 - 1.00 mg/dL Final   Creatinine, Urine  Date Value Ref Range Status  03/22/2023 105 20 - 275 mg/dL Final         Failed - Last BP in normal range    BP Readings from Last 1 Encounters:  06/21/23 (!) 149/89         Passed - K in normal range and within 180 days    Potassium  Date Value Ref Range Status  05/17/2023 4.4 3.5 - 5.1 mmol/L Final  11/05/2012 3.7 3.5 - 5.1 mmol/L Final         Passed - Na in normal range and within 180 days    Sodium  Date Value Ref Range Status  05/17/2023 138 135 - 145 mmol/L Final  11/05/2012 135 (L) 136 - 145 mmol/L Final         Passed - eGFR is 30 or above and within 180 days    EGFR (African American)  Date Value Ref Range Status  11/05/2012 >60  Final   GFR calc Af Amer  Date Value Ref Range Status  07/07/2017 >60 >60 mL/min Final    Comment:    (NOTE) The eGFR has been calculated using the CKD EPI equation. This calculation has not been validated in all clinical situations. eGFR's persistently <60 mL/min signify possible Chronic Kidney Disease.    EGFR (Non-African Amer.)  Date Value Ref Range Status  11/05/2012 >60  Final    Comment:    eGFR values <83mL/min/1.73 m2 may be an indication of chronic kidney disease (CKD). Calculated eGFR is useful in patients with stable renal function. The eGFR calculation will not be reliable in acutely ill patients when serum creatinine is changing rapidly. It is not useful in  patients on dialysis. The eGFR calculation may not be  applicable to patients at the low and high extremes of body sizes, pregnant women, and vegetarians.    GFR, Estimated  Date Value Ref Range Status  05/17/2023 56 (L) >60 mL/min Final    Comment:    (NOTE) Calculated using the CKD-EPI Creatinine Equation (2021)    eGFR  Date Value Ref Range Status  03/22/2023 63 > OR = 60 mL/min/1.54m2 Final         Passed - Valid encounter within last 6 months    Recent Outpatient Visits           2 months ago Hospital discharge follow-up   [facility] Berniece Salines, FNP   3 months ago Tobacco dependence   [facility] Berniece Salines, FNP   1 year ago Weight loss   [facility] Berniece Salines, FNP       Future Appointments             In 2 weeks Zane Herald, Rudolpho Sevin, FNP [facility], [facility]  Refused Prescriptions Disp Refills   varenicline (CHANTIX) 1 MG tablet 180 tablet 1    Sig: Take 1 tablet (1 mg total) by mouth 2 (two) times daily.     Psychiatry:  Drug Dependence Therapy - varenicline Failed - 07/10/2023  8:21 AM      Failed - Manual Review: Do not refill starter pack. 1 mg tabs may be extended up to one year if the patient has quit smoking but still feels at risk for relapse.      Failed - Cr in normal range and within 180 days    Creat  Date Value Ref Range Status  04/17/2023 1.03 0.50 - 1.03 mg/dL Final   Creatinine, Ser  Date Value Ref Range Status  05/17/2023 1.13 (H) 0.44 - 1.00 mg/dL Final   Creatinine, Urine  Date Value Ref Range Status  03/22/2023 105 20 - 275 mg/dL Final         Passed - Completed PHQ-2 or PHQ-9 in the last 360 days      Passed - Valid encounter within last 6 months    Recent Outpatient Visits           2 months ago Hospital discharge follow-up   [facility] Berniece Salines, FNP   3 months ago Tobacco dependence   [facility] Berniece Salines, FNP   1 year ago Weight loss   [facility] Berniece Salines, FNP       Future Appointments             In 2 weeks Zane Herald, Rudolpho Sevin, FNP [facility], [facility]             Insulin Pen Needle (BD PEN NEEDLE MICRO U/F) 32G X 6 MM MISC 100 each 0    Sig: Use to administer insulin     Endocrinology: Diabetes - Testing Supplies Passed - 07/10/2023  8:21 AM      Passed - Valid encounter within last 12 months    Recent Outpatient Visits           2 months ago Hospital discharge follow-up   [facility] Berniece Salines, FNP   3 months ago Tobacco dependence   [facility] Berniece Salines, FNP   1 year ago Weight loss   [facility] Berniece Salines, FNP       Future Appointments             In 2 weeks Zane Herald, Rudolpho Sevin, FNP [facility], [facility]

## 2023-07-20 ENCOUNTER — Other Ambulatory Visit: Payer: Self-pay

## 2023-07-24 NOTE — Progress Notes (Unsigned)
Name: Jane Morrison   MRN: [id number]    DOB: October 28, 1961   Date:07/26/2023       Progress Note  Subjective  Chief Complaint  Chief Complaint  Patient presents with   Annual Exam    HPI  Patient presents for annual CPE.  Diet: Regular, tries to eat well balanced diet Exercise: None, recommend 150 min of physical activity weekly   Last Eye Exam: April 2024 Last Dental Exam: None  Flowsheet Row Office Visit from 07/26/2023 in [facility]  AUDIT-C Score 0      Depression: Phq 9 is  negative    07/26/2023    9:48 AM 04/17/2023   10:45 AM 03/22/2023   11:03 AM 06/13/2022    1:51 PM  Depression screen PHQ 2/9  Decreased Interest 0 0 0 0  Down, Depressed, Hopeless 0 0 0 0  PHQ - 2 Score 0 0 0 0   Hypertension: BP Readings from Last 3 Encounters:  07/26/23 120/72  07/26/23 (!) 159/93  06/21/23 (!) 149/89   Obesity: Wt Readings from Last 3 Encounters:  07/26/23 155 lb 4.8 oz (70.4 kg)  07/26/23 155 lb 12.8 oz (70.7 kg)  06/21/23 152 lb (68.9 kg)   BMI Readings from Last 3 Encounters:  07/26/23 24.69 kg/m  07/26/23 23.69 kg/m  06/21/23 23.11 kg/m     Vaccines:  HPV: up to at age 6 , ask insurance if age between 14-45  Shingrix: 2-64 yo and ask insurance if covered when patient above 16 yo Pneumonia:  educated and discussed with patient. Flu:  educated and discussed with patient.     Hep C Screening: completed STD testing and prevention (HIV/chl/gon/syphilis): completed Intimate partner violence: negative screen  Sexual History : yes Menstrual History/LMP/Abnormal Bleeding: post menopausal Discussed importance of follow up if any post-menopausal bleeding: yes  Incontinence Symptoms: negative for symptoms   Breast cancer:  - Last Mammogram: 12/20/2022 - BRCA gene screening: none  Osteoporosis Prevention : Discussed high calcium and vitamin D supplementation, weight bearing exercises Bone density :no   Cervical cancer  screening: due  Skin cancer: Discussed monitoring for atypical lesions  Colorectal cancer:   06/29/2022 Lung cancer:  Low Dose CT Chest recommended if Age 31-80 years, 20 pack-year currently smoking OR have quit w/in 15years. Patient does qualify for screen   ECG: 04/26/2023  Advanced Care Planning: A voluntary discussion about advance care planning including the explanation and discussion of advance directives.  Discussed health care proxy and Living will, and the patient was able to identify a health care proxy as husband.  Patient does have a living will and power of attorney of health care   Lipids: Lab Results  Component Value Date   CHOL 159 03/31/2023   CHOL 161 06/13/2022   Lab Results  Component Value Date   HDL 41 03/31/2023   HDL 75 06/13/2022   Lab Results  Component Value Date   LDLCALC 93 03/31/2023   LDLCALC 67 06/13/2022   Lab Results  Component Value Date   TRIG 124 03/31/2023   TRIG 104 06/13/2022   Lab Results  Component Value Date   CHOLHDL 3.9 03/31/2023   CHOLHDL 2.1 06/13/2022   No results found for: "LDLDIRECT"  Glucose: Glucose  Date Value Ref Range Status  11/05/2012 140 (H) 65 - 99 mg/dL Final   Glucose, Bld  Date Value Ref Range Status  05/17/2023 245 (H) 70 - 99 mg/dL Final    Comment:  Glucose reference range applies only to samples taken after fasting for at least 8 hours.  04/17/2023 151 (H) 65 - 99 mg/dL Final    Comment:    .            Fasting reference interval . For someone without known diabetes, a glucose value >125 mg/dL indicates that they may have diabetes and this should be confirmed with a follow-up test. .   04/03/2023 136 (H) 70 - 99 mg/dL Final    Comment:    Glucose reference range applies only to samples taken after fasting for at least 8 hours.   Glucose-Capillary  Date Value Ref Range Status  04/03/2023 141 (H) 70 - 99 mg/dL Final    Comment:    Glucose reference range applies only to samples taken  after fasting for at least 8 hours.  04/02/2023 209 (H) 70 - 99 mg/dL Final    Comment:    Glucose reference range applies only to samples taken after fasting for at least 8 hours.  04/02/2023 215 (H) 70 - 99 mg/dL Final    Comment:    Glucose reference range applies only to samples taken after fasting for at least 8 hours.    Patient Active Problem List   Diagnosis Date Noted   Acute respiratory failure with hypoxia (HCC) 03/30/2023   Hypertensive urgency 03/30/2023   Uncontrolled type 2 diabetes mellitus with hyperglycemia, with long-term current use of insulin (HCC) 03/30/2023   Acute HFrEF (heart failure with reduced ejection fraction) (HCC) 03/30/2023   Shortness of breath 03/30/2023   Acute on chronic congestive heart failure (HCC) 03/30/2023   Elevated troponin 03/30/2023   Respiratory distress 03/30/2023   Type 2 diabetes mellitus with hyperglycemia, with long-term current use of insulin (HCC) 03/22/2023   Tobacco dependence 06/13/2022   Essential hypertension 04/05/2019    Past Surgical History:  Procedure Laterality Date   COLONOSCOPY WITH PROPOFOL N/A 06/29/2022   Procedure: COLONOSCOPY WITH PROPOFOL;  Surgeon: Wyline Mood, MD;  Location[facility];  Service: Gastroenterology;  Laterality: N/A;   RIGHT/LEFT HEART CATH AND CORONARY ANGIOGRAPHY N/A 04/03/2023   Procedure: RIGHT/LEFT HEART CATH AND CORONARY ANGIOGRAPHY;  Surgeon: Marcina Millard, MD;  Location: [facility] INVASIVE CV LAB;  Service: Cardiovascular;  Laterality: N/A;   TUBAL LIGATION      Family History  Problem Relation Age of Onset   Hypertension Mother    Diabetes Mother    Hypertension Father    Diabetes Father    Breast cancer Sister 72    Social History   Socioeconomic History   Marital status: Married    Spouse name: Not on file   Number of children: 3   Years of education: Not on file   Highest education level: Associate degree: academic program  Occupational History   Not on file   Tobacco Use   Smoking status: Former    Current packs/day: 0.00    Average packs/day: 0.5 packs/day for 40.0 years (20.0 ttl pk-yrs)    Types: Cigarettes    Start date: 03/29/1983    Quit date: 03/29/2023    Years since quitting: 0.3    Passive exposure: Past   Smokeless tobacco: Never  Vaping Use   Vaping status: Never Used  Substance and Sexual Activity   Alcohol use: No   Drug use: No   Sexual activity: Yes  Other Topics Concern   Not on file  Social History Narrative   3 biological kids 4 stepchildren   Social  Determinants of Health   Financial Resource Strain: Low Risk  (07/26/2023)   Overall Financial Resource Strain (CARDIA)    Difficulty of Paying Living Expenses: Not very hard  Food Insecurity: No Food Insecurity (07/26/2023)   Hunger Vital Sign    Worried About Running Out of Food in the Last Year: Never true    Ran Out of Food in the Last Year: Never true  Transportation Needs: No Transportation Needs (07/26/2023)   PRAPARE - Administrator, Civil Service (Medical): No    Lack of Transportation (Non-Medical): No  Physical Activity: Inactive (07/26/2023)   Exercise Vital Sign    Days of Exercise per Week: 0 days    Minutes of Exercise per Session: 0 min  Stress: No Stress Concern Present (07/26/2023)   [employer] of Occupational Health - Occupational Stress Questionnaire    Feeling of Stress : Only a little  Social Connections: Moderately Integrated (07/26/2023)   Social Connection and Isolation Panel [NHANES]    Frequency of Communication with Friends and Family: More than three times a week    Frequency of Social Gatherings with Friends and Family: More than three times a week    Attends Religious Services: More than 4 times per year    Active Member of [employer] or Organizations: No    Attends Banker Meetings: Never    Marital Status: Married  Catering manager Violence: Not At Risk (07/26/2023)   Humiliation, Afraid, Rape, and Kick  questionnaire    Fear of Current or Ex-Partner: No    Emotionally Abused: No    Physically Abused: No    Sexually Abused: No     Current Outpatient Medications:    aspirin 81 MG chewable tablet, Chew 1 tablet (81 mg total) by mouth daily., Disp: 90 tablet, Rfl: 0   empagliflozin (JARDIANCE) 10 MG TABS tablet, Take 1 tablet (10 mg total) by mouth daily before breakfast., Disp: 30 tablet, Rfl: 1   Glucagon (GVOKE HYPOPEN 1-PACK) 0.5 MG/0.1ML SOAJ, Inject 0.5 mg into the skin once as needed for up to 1 dose (for hypoglycemia)., Disp: 0.1 mL, Rfl: 3   insulin degludec (TRESIBA FLEXTOUCH) 100 UNIT/ML FlexTouch Pen, Inject 10 Units into the skin daily., Disp: 3 mL, Rfl: 3   metFORMIN (GLUCOPHAGE) 500 MG tablet, TAKE 1 TABLET BY MOUTH 2 TIMES DAILY WITH A MEAL., Disp: 60 tablet, Rfl: 2   metoprolol succinate (TOPROL-XL) 25 MG 24 hr tablet, Take 1 tablet (25 mg total) by mouth daily., Disp: 30 tablet, Rfl: 2   sacubitril-valsartan (ENTRESTO) 49-51 MG, Take 1 tablet by mouth 2 (two) times daily., Disp: 60 tablet, Rfl: 5   spironolactone (ALDACTONE) 25 MG tablet, Take 1 tablet (25 mg total) by mouth daily., Disp: 90 tablet, Rfl: 3   Accu-Chek Softclix Lancets lancets, Use as instructed (Patient not taking: Reported on 05/17/2023), Disp: 100 each, Rfl: 12   blood glucose meter kit and supplies KIT, Dispense based on patient and insurance preference. Use up to four times daily as directed. (FOR ICD-9 250.00, 250.01). (Patient not taking: Reported on 03/22/2023), Disp: 1 each, Rfl: 0   Glucose Blood (BLOOD GLUCOSE TEST STRIPS) STRP, Use as directed to monitor FSBS once daily for DM (Patient not taking: Reported on 03/22/2023), Disp: 100 strip, Rfl: 3   Insulin Pen Needle (BD PEN NEEDLE MICRO U/F) 32G X 6 MM MISC, Use to administer insulin, Disp: 100 each, Rfl: 5   NONFORMULARY OR COMPOUNDED ITEM, [place] - compounding cream  Rx scar cream for fibroma 120gram  3 refills  Faxed in 07/10/23 (Patient not taking:  Reported on 07/26/2023), Disp: , Rfl:    varenicline (CHANTIX) 1 MG tablet, Take 1 tablet (1 mg total) by mouth 2 (two) times daily., Disp: 180 tablet, Rfl: 1  No Known Allergies   ROS  Constitutional: Negative for fever or weight change.  Respiratory: Negative for cough and shortness of breath.   Cardiovascular: Negative for chest pain or palpitations.  Gastrointestinal: Negative for abdominal pain, no bowel changes.  Musculoskeletal: Negative for gait problem or joint swelling.  Skin: Negative for rash.  Neurological: Negative for dizziness or headache.  No other specific complaints in a complete review of systems (except as listed in HPI above).   Objective  Vitals:   07/26/23 0943  BP: 120/72  Pulse: 78  Resp: 16  Temp: 98.2 F (36.8 C)  TempSrc: Oral  SpO2: 98%  Weight: 155 lb 4.8 oz (70.4 kg)  Height: 5' 6.5" (1.689 m)    Body mass index is 24.69 kg/m.  Physical Exam Constitutional: Patient appears well-developed and well-nourished. No distress.  HENT: Head: Normocephalic and atraumatic. Ears: B TMs ok, no erythema or effusion; Nose: Nose normal. Mouth/Throat: Oropharynx is clear and moist. No oropharyngeal exudate.  Eyes: Conjunctivae and EOM are normal. Pupils are equal, round, and reactive to light. No scleral icterus.  Neck: Normal range of motion. Neck supple. No JVD present. No thyromegaly present.  Cardiovascular: Normal rate, regular rhythm and normal heart sounds.  No murmur heard. No BLE edema. Pulmonary/Chest: Effort normal and breath sounds normal. No respiratory distress. Abdominal: Soft. Bowel sounds are normal, no distension. There is no tenderness. no masses Breast: no lumps or masses, no nipple discharge or rashes FEMALE GENITALIA:  External genitalia normal External urethra normal Vaginal vault normal without discharge or lesions Cervix normal without discharge or lesions Bimanual exam normal without masses RECTAL: no rectal masses or  hemorrhoids Musculoskeletal: Normal range of motion, no joint effusions. No gross deformities Neurological: he is alert and oriented to person, place, and time. No cranial nerve deficit. Coordination, balance, strength, speech and gait are normal.  Skin: Skin is warm and dry. No rash noted. No erythema.  Psychiatric: Patient has a normal mood and affect. behavior is normal. Judgment and thought content normal.  Diabetic Foot Exam - Simple   Simple Foot Form Diabetic Foot exam was performed with the following findings: Yes 07/26/2023 10:02 AM  Visual Inspection No deformities, no ulcerations, no other skin breakdown bilaterally: Yes Sensation Testing Intact to touch and monofilament testing bilaterally: Yes Pulse Check Posterior Tibialis and Dorsalis pulse intact bilaterally: Yes Comments     Recent Results (from the past 2160 hour(s))  Basic metabolic panel     Status: Abnormal   Collection Time: 05/17/23  1:57 PM  Result Value Ref Range   Sodium 138 135 - 145 mmol/L   Potassium 4.4 3.5 - 5.1 mmol/L   Chloride 104 98 - 111 mmol/L   CO2 24 22 - 32 mmol/L   Glucose, Bld 245 (H) 70 - 99 mg/dL    Comment: Glucose reference range applies only to samples taken after fasting for at least 8 hours.   BUN 20 6 - 20 mg/dL   Creatinine, Ser 1.61 (H) 0.44 - 1.00 mg/dL   Calcium 9.4 8.9 - 09.6 mg/dL   GFR, Estimated 56 (L) >60 mL/min    Comment: (NOTE) Calculated using the CKD-EPI Creatinine Equation (2021)  Anion gap 10 5 - 15    Comment: Performed at [facility], [address] [place], [place] [address]     Fall Risk:    07/26/2023    9:48 AM 04/17/2023   10:45 AM 03/22/2023   11:03 AM 06/13/2022    1:51 PM  Fall Risk   Falls in the past year? 0 0 0 0  Number falls in past yr: 0 0 0 0  Injury with Fall? 0 0 0 0  Follow up    Falls evaluation completed     Functional Status Survey: Is the patient deaf or have difficulty hearing?: No Does the patient have difficulty  seeing, even when wearing glasses/contacts?: No Does the patient have difficulty concentrating, remembering, or making decisions?: No Does the patient have difficulty walking or climbing stairs?: No Does the patient have difficulty dressing or bathing?: No Does the patient have difficulty doing errands alone such as visiting a doctor's office or shopping?: No   Assessment & Plan  1. Annual physical exam recommend 150 min of physical activity weekly   - BASIC METABOLIC PANEL WITH GFR - CBC with Differential/Platelet - Hemoglobin A1c - HM Diabetes Foot Exam - Cytology - PAP  2. Type 2 diabetes mellitus with hyperglycemia, without long-term current use of insulin (HCC) - patient reports she has plenty of insulin but does not have the needles to administer, refill sent - Hemoglobin A1c - HM Diabetes Foot Exam - Insulin Pen Needle (BD PEN NEEDLE MICRO U/F) 32G X 6 MM MISC; Use to administer insulin  Dispense: 100 each; Refill: 5  3. Essential hypertension  - BASIC METABOLIC PANEL WITH GFR - CBC with Differential/Platelet  4. Screening for cervical cancer  - Cytology - PAP   5. Tobacco dependence -patient has not smoked since 03/29/2023 - varenicline (CHANTIX) 1 MG tablet; Take 1 tablet (1 mg total) by mouth 2 (two) times daily.  Dispense: 180 tablet; Refill: 1   -USPSTF grade A and B recommendations reviewed with patient; age-appropriate recommendations, preventive care, screening tests, etc discussed and encouraged; healthy living encouraged; see AVS for patient education given to patient -Discussed importance of 150 minutes of physical activity weekly, eat two servings of fish weekly, eat one serving of tree nuts ( cashews, pistachios, pecans, almonds.Marland Kitchen) every other day, eat 6 servings of fruit/vegetables daily and drink plenty of water and avoid sweet beverages.   -Reviewed Health Maintenance: Yes.

## 2023-07-26 ENCOUNTER — Encounter: Payer: Self-pay | Admitting: Pharmacy Technician

## 2023-07-26 ENCOUNTER — Ambulatory Visit (INDEPENDENT_AMBULATORY_CARE_PROVIDER_SITE_OTHER): Payer: 59 | Admitting: Nurse Practitioner

## 2023-07-26 ENCOUNTER — Other Ambulatory Visit: Payer: Self-pay

## 2023-07-26 ENCOUNTER — Encounter: Payer: Self-pay | Admitting: Family

## 2023-07-26 ENCOUNTER — Encounter: Payer: Self-pay | Admitting: Nurse Practitioner

## 2023-07-26 ENCOUNTER — Other Ambulatory Visit (HOSPITAL_COMMUNITY)
Admission: RE | Admit: 2023-07-26 | Discharge: 2023-07-26 | Disposition: A | Discharge status: Home / Self Care | Payer: 59 | Source: Ambulatory Visit | Attending: Nurse Practitioner | Admitting: Nurse Practitioner

## 2023-07-26 ENCOUNTER — Ambulatory Visit: Payer: 59 | Attending: Family | Admitting: Family

## 2023-07-26 VITALS — BP 159/93 | HR 75 | Wt 155.8 lb

## 2023-07-26 VITALS — BP 120/72 | HR 78 | Temp 98.2°F | Resp 16 | Ht 66.5 in | Wt 155.3 lb

## 2023-07-26 DIAGNOSIS — Z7984 Long term (current) use of oral hypoglycemic drugs: Secondary | ICD-10-CM

## 2023-07-26 DIAGNOSIS — Z794 Long term (current) use of insulin: Secondary | ICD-10-CM | POA: Insufficient documentation

## 2023-07-26 DIAGNOSIS — Z79899 Other long term (current) drug therapy: Secondary | ICD-10-CM | POA: Diagnosis not present

## 2023-07-26 DIAGNOSIS — E1165 Type 2 diabetes mellitus with hyperglycemia: Secondary | ICD-10-CM

## 2023-07-26 DIAGNOSIS — Z124 Encounter for screening for malignant neoplasm of cervix: Secondary | ICD-10-CM | POA: Diagnosis not present

## 2023-07-26 DIAGNOSIS — I5022 Chronic systolic (congestive) heart failure: Secondary | ICD-10-CM | POA: Diagnosis not present

## 2023-07-26 DIAGNOSIS — I10 Essential (primary) hypertension: Secondary | ICD-10-CM | POA: Diagnosis not present

## 2023-07-26 DIAGNOSIS — I428 Other cardiomyopathies: Secondary | ICD-10-CM | POA: Diagnosis not present

## 2023-07-26 DIAGNOSIS — I110 Hypertensive heart disease with heart failure: Secondary | ICD-10-CM | POA: Diagnosis not present

## 2023-07-26 DIAGNOSIS — F419 Anxiety disorder, unspecified: Secondary | ICD-10-CM | POA: Insufficient documentation

## 2023-07-26 DIAGNOSIS — Z0000 Encounter for general adult medical examination without abnormal findings: Secondary | ICD-10-CM | POA: Diagnosis not present

## 2023-07-26 DIAGNOSIS — E785 Hyperlipidemia, unspecified: Secondary | ICD-10-CM | POA: Diagnosis not present

## 2023-07-26 DIAGNOSIS — I509 Heart failure, unspecified: Secondary | ICD-10-CM | POA: Diagnosis not present

## 2023-07-26 DIAGNOSIS — F17200 Nicotine dependence, unspecified, uncomplicated: Secondary | ICD-10-CM | POA: Diagnosis not present

## 2023-07-26 DIAGNOSIS — E119 Type 2 diabetes mellitus without complications: Secondary | ICD-10-CM | POA: Insufficient documentation

## 2023-07-26 DIAGNOSIS — I2510 Atherosclerotic heart disease of native coronary artery without angina pectoris: Secondary | ICD-10-CM | POA: Diagnosis not present

## 2023-07-26 MED ORDER — SPIRONOLACTONE 25 MG PO TABS: 25.0000 mg | ORAL_TABLET | Freq: Every day | ORAL | 3 refills | Status: DC

## 2023-07-26 MED ORDER — VARENICLINE TARTRATE 1 MG PO TABS
1.0000 mg | ORAL_TABLET | Freq: Two times a day (BID) | ORAL | 1 refills | Status: DC
Start: 2023-07-26 — End: 2023-12-20

## 2023-07-26 MED ORDER — BD PEN NEEDLE MICRO U/F 32G X 6 MM MISC: 5 refills | Status: DC

## 2023-07-26 NOTE — Patient Instructions (Signed)
Increase your spironolactone to 1 tablet daily

## 2023-07-26 NOTE — Progress Notes (Signed)
[facility] - HEART FAILURE CLINIC - PHARMACIST COUNSELING NOTE  Guideline-Directed Medical Therapy/Evidence Based Medicine  ACE/ARB/ARNI: Sacubitril-valsartan 49-51 mg twice daily Beta Blocker: Metoprolol succinate 25 mg daily Aldosterone Antagonist: Spironolactone 12.5 mg daily Diuretic:  N/A SGLT2i: Empagliflozin 10 mg daily  Adherence Assessment  Do you ever forget to take your medication? [] Yes [x] No  Do you ever skip doses due to side effects? [] Yes [x] No  Do you have trouble affording your medicines? [x] Yes [] No  Are you ever unable to pick up your medication due to transportation difficulties? [] Yes [x] No  Do you ever stop taking your medications because you don't believe they are helping? [] Yes [x] No  Do you check your weight daily? [x] Yes [] No   Adherence strategy: Pill box  Barriers to obtaining medications: Cost, for brand name and expensive medications Sherryll Burger, Jardiance, Tresiba)  Vital signs: BP 159/93, weight (pounds) 155 ECHO: Date 03/30/2023, EF 25-30%, notes: LV demonstrates regional wall abnormalities. Grade I diastolic dysfunction (impaired relaxation)      Latest Ref Rng & Units 05/17/2023    1:57 PM 04/17/2023   11:13 AM 04/03/2023    8:11 AM  BMP  Glucose 70 - 99 mg/dL 409  811    BUN 6 - 20 mg/dL 20  17    Creatinine 9.14 - 1.00 mg/dL 7.82  9.56    BUN/Creat Ratio 6 - 22 (calc)  SEE NOTE:    Sodium 135 - 145 mmol/L 138  137  141   Potassium 3.5 - 5.1 mmol/L 4.4  4.9  3.5   Chloride 98 - 111 mmol/L 104  102    CO2 22 - 32 mmol/L 24  24    Calcium 8.9 - 10.3 mg/dL 9.4  9.6      Past Medical History:  Diagnosis Date   Anxiety    CHF (congestive heart failure) (HCC)    Depression    Diabetes mellitus without complication (HCC)    Hyperlipidemia    Hypertension     ASSESSMENT 62 year old female who presents to the HF clinic for follow-up regarding HFrEF. PMH includes DM, HLD, HTN, MDD/GAD, CAD, tobacco use. She presents  in good spirits today with no complaints. She brought her daily log of weights and I encouraged her to record a daily blood pressure right below where she writes the weight. Her office BP is elevated today and she reports that her SBP is typically around 140 at home.  She receives Chief Financial Officer and [place] through [facility] but the rest of her medication through [facility] in [place]. Not currently needing refills for HFrEF medications.  Recent ED Visit (past 6 months): 5/16 - CC: mechanical fall 4/18 - CC: AoCHF (dyspnea, orthopnea) 3/11 - CC: Acute cough    PLAN  HFrEF (EF 25-30%) Will increase spironolactone to 25 mg daily Recommend follow-up BMP in one week following titration Continue Entresto, Jardiance, and Toprol XL BMP today DM 03/22/2023 A1c 10.4% Encourage patient to consider receiving insulin through [facility] Continue metformin and Jardiance A1c with labs today HLD 03/31/2023 LDL 93 ASCVD Plus score 10-yr risk: 45% Consider high intensity statin  Time spent: 10 minutes  Will M. Dareen Piano, PharmD Clinical Pharmacist 07/26/2023 9:40 AM

## 2023-07-26 NOTE — Progress Notes (Signed)
PCP: Berniece Salines, FNP (last seen 05/24) Cardiologist: Marcina Millard, MD (last seen during recent admission)  HPI:  Jane Morrison is a 62 y/o female with a history of DM, hyperlipidemia, HTN, anxiety, depression, CAD, tobacco use & chronic heart failure (newly diagnosed)  Was in the ED 04/27/23 after a mechanical fall after she tripped while at work at [employer]. Hit her head on the ground but no LOC. Negative imaging. Admitted 03/30/23 due to acute onset of worsening dyspnea, associated orthopnea, chest pressure and cough productive of clear sputum as well as wheezing. Diuresed with IV lasix. NTG drip started and cardiology consulted. Initially needed bipap due to hypoxia but able to be weaned off to room air. Cath done per above. Was in the ED 02/20/23 due to acute cough.   Echo 03/30/23: EF 25-30% with mild/ moderate LVH consistent with Grade I DD and mild MR.   RHC/LHC 04/03/23:   Prox RCA to Mid RCA lesion is 70% stenosed.   Dist RCA lesion is 50% stenosed.   Ost LAD to Prox LAD lesion is 30% stenosed.   There is moderate left ventricular systolic dysfunction.   The left ventricular ejection fraction is 35-45% by visual estimate.  1.  Normal right sided heart pressures (PA 25/7 (13), PCWP 6/4) 2.  Mild to moderate coronary artery disease with 70% stenosis mid RCA 3.  Moderately reduced left ventricular function with estimated LV ejection fraction 35-45%  She presents today with a chief complaint of a HF follow-up visit. Denies fatigue, SOB, chest pain, palpitations, abdominal distention, pedal edema, dizziness or weight gain. Did have one overnight weight gain of 6 pounds after eating tacos and [place] food but without symptoms. The following day her weight was back down.   Has been out of insulin completely for the last 2 weeks. Hasn't smoked since 04/24.  At last visit, entresto was increased to 49/51mg  BID. Checking her BP at home although didn't bring her log. Reports home SBP  running 140's.  ROS: All systems negative except as listed in HPI, PMH and Problem List.  SH:  Social History   Socioeconomic History   Marital status: Married    Spouse name: Not on file   Number of children: 3   Years of education: Not on file   Highest education level: Associate degree: academic program  Occupational History   Not on file  Tobacco Use   Smoking status: Former    Current packs/day: 0.00    Average packs/day: 0.5 packs/day for 40.0 years (20.0 ttl pk-yrs)    Types: Cigarettes    Start date: 03/29/1983    Quit date: 03/29/2023    Years since quitting: 0.3    Passive exposure: Past   Smokeless tobacco: Never  Vaping Use   Vaping status: Never Used  Substance and Sexual Activity   Alcohol use: No   Drug use: No   Sexual activity: Yes  Other Topics Concern   Not on file  Social History Narrative   3 biological kids 4 stepchildren   Social Determinants of Health   Financial Resource Strain: Low Risk  (03/21/2023)   Overall Financial Resource Strain (CARDIA)    Difficulty of Paying Living Expenses: Not hard at all  Food Insecurity: Unknown (03/21/2023)   Hunger Vital Sign    Worried About Running Out of Food in the Last Year: Patient declined    Ran Out of Food in the Last Year: Never true  Transportation Needs: No Transportation Needs (  03/21/2023)   PRAPARE - Administrator, Civil Service (Medical): No    Lack of Transportation (Non-Medical): No  Physical Activity: Insufficiently Active (03/21/2023)   Exercise Vital Sign    Days of Exercise per Week: 3 days    Minutes of Exercise per Session: 10 min  Stress: Patient Declined (03/21/2023)   [employer] of Occupational Health - Occupational Stress Questionnaire    Feeling of Stress : Patient declined  Social Connections: Socially Integrated (03/21/2023)   Social Connection and Isolation Panel [NHANES]    Frequency of Communication with Friends and Family: Once a week    Frequency of Social  Gatherings with Friends and Family: More than three times a week    Attends Religious Services: More than 4 times per year    Active Member of [employer] or Organizations: Yes    Attends Banker Meetings: 1 to 4 times per year    Marital Status: Married  Catering manager Violence: Not on file    FH:  Family History  Problem Relation Age of Onset   Hypertension Mother    Diabetes Mother    Hypertension Father    Diabetes Father    Breast cancer Sister 90    Past Medical History:  Diagnosis Date   Anxiety    CHF (congestive heart failure) (HCC)    Depression    Diabetes mellitus without complication (HCC)    Hyperlipidemia    Hypertension     Current Outpatient Medications  Medication Sig Dispense Refill   Accu-Chek Softclix Lancets lancets Use as instructed (Patient not taking: Reported on 05/17/2023) 100 each 12   aspirin 81 MG chewable tablet Chew 1 tablet (81 mg total) by mouth daily. 90 tablet 0   blood glucose meter kit and supplies KIT Dispense based on patient and insurance preference. Use up to four times daily as directed. (FOR ICD-9 250.00, 250.01). (Patient not taking: Reported on 03/22/2023) 1 each 0   empagliflozin (JARDIANCE) 10 MG TABS tablet Take 1 tablet (10 mg total) by mouth daily before breakfast. 30 tablet 1   Glucagon (GVOKE HYPOPEN 1-PACK) 0.5 MG/0.1ML SOAJ Inject 0.5 mg into the skin once as needed for up to 1 dose (for hypoglycemia). (Patient not taking: Reported on 06/21/2023) 0.1 mL 3   Glucose Blood (BLOOD GLUCOSE TEST STRIPS) STRP Use as directed to monitor FSBS once daily for DM (Patient not taking: Reported on 03/22/2023) 100 strip 3   insulin degludec (TRESIBA FLEXTOUCH) 100 UNIT/ML FlexTouch Pen Inject 10 Units into the skin daily. (Patient not taking: Reported on 06/21/2023) 3 mL 3   Insulin Pen Needle (BD PEN NEEDLE MICRO U/F) 32G X 6 MM MISC Use to administer insulin (Patient not taking: Reported on 06/21/2023) 100 each 0   metFORMIN  (GLUCOPHAGE) 500 MG tablet TAKE 1 TABLET BY MOUTH 2 TIMES DAILY WITH A MEAL. 60 tablet 2   metoprolol succinate (TOPROL-XL) 25 MG 24 hr tablet Take 1 tablet (25 mg total) by mouth daily. 30 tablet 2   NONFORMULARY OR COMPOUNDED ITEM [facility] - compounding cream Rx scar cream for fibroma 120gram  3 refills  Faxed in 07/10/23     sacubitril-valsartan (ENTRESTO) 49-51 MG Take 1 tablet by mouth 2 (two) times daily. 60 tablet 5   spironolactone (ALDACTONE) 25 MG tablet Take 0.5 tablets (12.5 mg total) by mouth daily. 45 tablet 0   terbinafine (LAMISIL) 250 MG tablet Take 1 tablet (250 mg total) by mouth daily. 30 tablet 0  terbinafine (LAMISIL) 250 MG tablet Take 1 tablet (250 mg total) by mouth daily. 30 tablet 0   varenicline (CHANTIX) 1 MG tablet TAKE 1 TABLET BY MOUTH TWICE A DAY 180 tablet 1   No current facility-administered medications for this visit.   Vitals:   07/26/23 0859  BP: (!) 159/93  Pulse: 75  SpO2: 99%  Weight: 155 lb 12.8 oz (70.7 kg)   Wt Readings from Last 3 Encounters:  07/26/23 155 lb 12.8 oz (70.7 kg)  06/21/23 152 lb (68.9 kg)  05/17/23 153 lb (69.4 kg)   Lab Results  Component Value Date   CREATININE 1.13 (H) 05/17/2023   CREATININE 1.03 04/17/2023   CREATININE 1.10 (H) 04/03/2023   PHYSICAL EXAM:  General:  Well appearing. No resp difficulty HEENT: normal Neck: supple. JVP flat.;no bruits.No thyromegaly or lymphadenopathy appreciated. Cor: PMI normal. Regular rate & rhythm. No rubs, gallops or murmurs. Lungs: clear Abdomen: soft, nontender, nondistended. No hepatosplenomegaly. No bruits or masses. Good bowel sounds. Extremities: no cyanosis, clubbing, rash, edema Neuro: alert & oriented x3, cranial nerves grossly intact. Moves all 4 extremities w/o difficulty. Affect pleasant.   ECG: not done   ASSESSMENT & PLAN:  1: NICM with reduced ejection fraction- - etiology likely HTN/ DM - NYHA class I - euvolemic - weighing daily & did have 1  overnight weight gain after eating [place] food but dropped back down the following day; reminded to call for an overnight weight gain of > 2 pounds or a weekly weight gain of >5 pounds - weight up 3 pounds from last visit here 1 month ago - Echo 03/30/23: EF 25-30% with mild/ moderate LVH consistent with Grade I DD and mild MR.  - continue jardiance 10mg  daily  - continue entresto 49/51mg  BID - continue metoprolol succinate 25mg  daily - increase spironolactone to 25mg  daily - BMP next visit - repeat echo after all meds have been titrated as much as possible - BMP today at her PCP's office to f/u on entresto titration from last visit - BNP 03/30/23 was 471.0 - PharmD reconciled meds w/ patient  2: HTN- - BP 159/93 - increasing spiro per above - saw PCP Jane Morrison) 05/24; returns today for her CPE - BMP 05/17/23 showed sodium 138, potassium 4.4, creatinine 1.13 & GFR 56  3: CAD- - saw cardiology ([facility]) during recent admission; f/u appt had been scheduled for 06/14/23 but she says that his office cancelled it and it hasn't been r/s yet - RHC/LHC 04/03/23:   Prox RCA to Mid RCA lesion is 70% stenosed.   Dist RCA lesion is 50% stenosed.   Ost LAD to Prox LAD lesion is 30% stenosed.   There is moderate left ventricular systolic dysfunction.   The left ventricular ejection fraction is 35-45% by visual estimate.  1.  Normal right sided heart pressures (PA 25/7 (13), PCWP 6/4) 2.  Mild to moderate coronary artery disease with 70% stenosis mid RCA 3.  Moderately reduced left ventricular function with estimated LV ejection fraction 35-45%  4: DM- - A1c 03/22/23 was 10.4% - has been out of her insulin completely for the last 2 weeks  Return in 3 weeks, sooner if needed.

## 2023-07-27 LAB — BASIC METABOLIC PANEL WITH GFR
BUN/Creatinine Ratio: 14 (calc) (ref 6–22)
BUN: 16 mg/dL (ref 7–25)
CO2: 27 mmol/L (ref 20–32)
Calcium: 10.2 mg/dL (ref 8.6–10.4)
Chloride: 103 mmol/L (ref 98–110)
Creat: 1.18 mg/dL — ABNORMAL HIGH (ref 0.50–1.03)
Glucose, Bld: 281 mg/dL — ABNORMAL HIGH (ref 65–99)
Potassium: 5.2 mmol/L (ref 3.5–5.3)
Sodium: 138 mmol/L (ref 135–146)
eGFR: 53 mL/min/{1.73_m2} — ABNORMAL LOW (ref >=60)

## 2023-07-27 LAB — CBC WITH DIFFERENTIAL/PLATELET
Absolute Monocytes: 459 {cells}/uL (ref 200–950)
Basophils Absolute: 28 {cells}/uL (ref 0–200)
Basophils Relative: 0.5 %
Eosinophils Absolute: 118 {cells}/uL (ref 15–500)
Eosinophils Relative: 2.1 %
HCT: 38.5 % (ref 35.0–45.0)
Hemoglobin: 13 g/dL (ref 11.7–15.5)
Lymphs Abs: 2201 {cells}/uL (ref 850–3900)
MCH: 30 pg (ref 27.0–33.0)
MCHC: 33.8 g/dL (ref 32.0–36.0)
MCV: 88.9 fL (ref 80.0–100.0)
MPV: 10.2 fL (ref 7.5–12.5)
Monocytes Relative: 8.2 %
Neutro Abs: 2794 {cells}/uL (ref 1500–7800)
Neutrophils Relative %: 49.9 %
Platelets: 332 10*3/uL (ref 140–400)
RBC: 4.33 10*6/uL (ref 3.80–5.10)
RDW: 13.6 % (ref 11.0–15.0)
Total Lymphocyte: 39.3 %
WBC: 5.6 10*3/uL (ref 3.8–10.8)

## 2023-07-27 LAB — HEMOGLOBIN A1C
Hgb A1c MFr Bld: 10.8 %{Hb} — ABNORMAL HIGH (ref <5.7)
Mean Plasma Glucose: 263 mg/dL
eAG (mmol/L): 14.6 mmol/L

## 2023-07-28 ENCOUNTER — Other Ambulatory Visit: Payer: Self-pay | Admitting: Nurse Practitioner

## 2023-07-28 DIAGNOSIS — Z794 Long term (current) use of insulin: Secondary | ICD-10-CM

## 2023-07-28 LAB — CYTOLOGY - PAP
Adequacy: ABSENT
Chlamydia: NEGATIVE
Comment: NEGATIVE
Comment: NEGATIVE
Comment: NORMAL
Diagnosis: NEGATIVE
High risk HPV: NEGATIVE
Neisseria Gonorrhea: NEGATIVE

## 2023-07-28 NOTE — Telephone Encounter (Signed)
Medication Refill - Medication: insulin degludec (TRESIBA FLEXTOUCH) 100 UNIT/ML FlexTouch Pen [[id number]]   Has the patient contacted their pharmacy? Yes.    (Agent: If yes, when and what did the pharmacy advise?) Contact PCP   Preferred Pharmacy (with phone number or street name): [facility] [place]   Has the patient been seen for an appointment in the last year OR does the patient have an upcoming appointment? Yes.    Agent: Please be advised that RX refills may take up to 3 business days. We ask that you follow-up with your pharmacy.

## 2023-07-31 ENCOUNTER — Other Ambulatory Visit: Payer: Self-pay | Admitting: Emergency Medicine

## 2023-07-31 DIAGNOSIS — Z794 Long term (current) use of insulin: Secondary | ICD-10-CM

## 2023-07-31 MED ORDER — TRESIBA FLEXTOUCH 100 UNIT/ML ~~LOC~~ SOPN
10.0000 [IU] | PEN_INJECTOR | Freq: Every day | SUBCUTANEOUS | 1 refills | Status: DC
Start: 2023-07-31 — End: 2024-03-13
  Filled 2024-02-28: qty 9, 90d supply, fill #0

## 2023-08-04 ENCOUNTER — Other Ambulatory Visit: Payer: Self-pay | Admitting: Podiatry

## 2023-08-13 ENCOUNTER — Other Ambulatory Visit: Payer: Self-pay

## 2023-08-13 ENCOUNTER — Emergency Department
Admission: EM | Admit: 2023-08-13 | Discharge: 2023-08-13 | Disposition: A | Discharge status: Home / Self Care | Payer: 59 | Attending: Emergency Medicine | Admitting: Emergency Medicine

## 2023-08-13 ENCOUNTER — Emergency Department: Payer: 59

## 2023-08-13 DIAGNOSIS — M7989 Other specified soft tissue disorders: Secondary | ICD-10-CM | POA: Diagnosis not present

## 2023-08-13 DIAGNOSIS — X501XXA Overexertion from prolonged static or awkward postures, initial encounter: Secondary | ICD-10-CM | POA: Insufficient documentation

## 2023-08-13 DIAGNOSIS — S93402A Sprain of unspecified ligament of left ankle, initial encounter: Secondary | ICD-10-CM | POA: Diagnosis not present

## 2023-08-13 DIAGNOSIS — S93492A Sprain of other ligament of left ankle, initial encounter: Secondary | ICD-10-CM | POA: Diagnosis not present

## 2023-08-13 DIAGNOSIS — M25572 Pain in left ankle and joints of left foot: Secondary | ICD-10-CM | POA: Diagnosis not present

## 2023-08-13 MED ORDER — MELOXICAM 15 MG PO TABS
15.0000 mg | ORAL_TABLET | Freq: Every day | ORAL | 0 refills | Status: DC
Start: 2023-08-13 — End: 2024-03-13

## 2023-08-13 NOTE — ED Triage Notes (Signed)
Pt to ed from home via POV for ankle pain. Pt twisted her ankle this morning and went to work. Completed her shift, and then the pain "become unbearable" so she came in to have it evaluated. Pt is caox4, in no acute distress and ambulated into the ED and then placed in a wheel chair.

## 2023-08-13 NOTE — ED Provider Notes (Signed)
[facility] Provider Note  Patient Contact: 9:27 PM (approximate)   History   Ankle Pain (LEFT)   HPI  Jane Morrison is a 62 y.o. female who presents the emergency department with left ankle pain after missing a step up into her truck.  Patient went to step up on the running board, missed and landed awkwardly on her left ankle rolling it.  Patient reports pain along the lateral aspect.  No deformity.  Patient is still bearing weight.  No other injury or complaint.     Physical Exam   Triage Vital Signs: ED Triage Vitals  Encounter Vitals Group     BP 08/13/23 2024 (!) 185/107     Systolic BP Percentile --      Diastolic BP Percentile --      Pulse Rate 08/13/23 2023 86     Resp 08/13/23 2023 16     Temp 08/13/23 2023 98 F (36.7 C)     Temp Source 08/13/23 2023 Oral     SpO2 08/13/23 2023 98 %     Weight 08/13/23 2022 154 lb 5.2 oz (70 kg)     Height 08/13/23 2022 5\' 6"  (1.676 m)     Head Circumference --      Peak Flow --      Pain Score 08/13/23 2022 10     Pain Loc --      Pain Education --      Exclude from Growth Chart --     Most recent vital signs: Vitals:   08/13/23 2023 08/13/23 2024  BP:  (!) 185/107  Pulse: 86   Resp: 16   Temp: 98 F (36.7 C)   SpO2: 98%      General: Alert and in no acute distress.  Cardiovascular:  Good peripheral perfusion Respiratory: Normal respiratory effort without tachypnea or retractions. Lungs CTAB.  Musculoskeletal: Full range of motion to all extremities.  Visualization of the left ankle reveals slight edema along the lateral aspect.  No obvious deformity.  Tenderness along the anterior and lateral aspect without palpable abnormality.  Pulses sensation intact to the foot. Neurologic:  No gross focal neurologic deficits are appreciated.  Skin:   No rash noted Other:   ED Results / Procedures / Treatments   Labs (all labs ordered are listed, but only abnormal results are  displayed) Labs Reviewed - No data to display   EKG     RADIOLOGY  I personally viewed, evaluated, and interpreted these images as part of my medical decision making, as well as reviewing the written report by the radiologist.  ED Provider Interpretation: No acute traumatic finding on x-ray.  DG Ankle Complete Left  Result Date: 08/13/2023 CLINICAL DATA:  Twisting injury, pain EXAM: LEFT ANKLE COMPLETE - 3+ VIEW COMPARISON:  None Available. FINDINGS: Frontal, oblique, and lateral views of the left ankle are obtained. There are well corticated ossific densities adjacent to the tip of the lateral malleolus, consistent with cysts sequela of chronic trauma. No acute fracture, subluxation, or dislocation. Joint spaces are well preserved. Mild anterior and lateral soft tissue swelling. IMPRESSION: 1. Mild anterolateral soft tissue swelling. 2. No evidence of acute fracture. Well corticated ossific densities adjacent to the tip of the lateral malleolus consistent with sequela of chronic injury. Electronically Signed   By: Sharlet Salina M.D.   On: 08/13/2023 20:38    PROCEDURES:  Critical Care performed: No  Procedures   MEDICATIONS ORDERED IN ED: Medications -  No data to display   IMPRESSION / MDM / ASSESSMENT AND PLAN / ED COURSE  I reviewed the triage vital signs and the nursing notes.                                 Differential diagnosis includes, but is not limited to, ankle sprain, ankle fracture, ankle dislocation   Patient's presentation is most consistent with acute presentation with potential threat to life or bodily function.   Patient's diagnosis is consistent with ankle sprain.  Patient presents emergency department rolling her ankle.  No obvious deformity on physical exam.  X-ray reveals no acute traumatic finding.  Patient is to be placed in a boot for symptom improvement.  Anti-inflammatory for additional symptom control.  Follow-up with podiatry if symptoms or not  improving..  Patient is given ED precautions to return to the ED for any worsening or new symptoms.     FINAL CLINICAL IMPRESSION(S) / ED DIAGNOSES   Final diagnoses:  Sprain of left ankle, unspecified ligament, initial encounter     Rx / DC Orders   ED Discharge Orders          Ordered    meloxicam (MOBIC) 15 MG tablet  Daily        08/13/23 2128             Note:  This document was prepared using Dragon voice recognition software and may include unintentional dictation errors.   Racheal Patches, PA-C 08/13/23 2131    Merwyn Katos, MD 08/18/23 1101

## 2023-08-16 ENCOUNTER — Ambulatory Visit (INDEPENDENT_AMBULATORY_CARE_PROVIDER_SITE_OTHER): Payer: 59 | Admitting: Podiatry

## 2023-08-16 ENCOUNTER — Encounter: Payer: Self-pay | Admitting: Podiatry

## 2023-08-16 DIAGNOSIS — B353 Tinea pedis: Secondary | ICD-10-CM

## 2023-08-16 DIAGNOSIS — M722 Plantar fascial fibromatosis: Secondary | ICD-10-CM

## 2023-08-16 MED ORDER — TERBINAFINE HCL 250 MG PO TABS: 250.0000 mg | ORAL_TABLET | Freq: Every day | ORAL | 0 refills | Status: DC

## 2023-08-16 NOTE — Progress Notes (Signed)
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  States that no one from [facility] ever called her to notify her that it was complete.  She states that her skin is looking much better as well as her nails on that right foot.  She does relate recently spraining her ankle while getting in her truck at work she went to the emergency department for that was placed in a cam boot and will be returning to work for this tomorrow.  Objective: Vital signs are stable alert oriented x 3.  Right foot does demonstrate plantar fibromas which appear to be unchanged.  Skin appears to be healing very nicely with resolution of tinea pedis onychomycosis is reducing with the use of the Lamisil.  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  Tinea pedis and onychomycosis resolving.  Plan: Discussed etiology pathology conservative versus surgical therapies at this point we wrote another prescription for Lamisil 30 tablets 1 p.o. daily we also rewrote the prescription for verapamil for her to place and plantar aspect of the foot daily.  This should help reduce the size and the pain associated with these fibromas.  I would like to follow-up with her in about another month to 6 weeks.

## 2023-08-22 NOTE — Progress Notes (Deleted)
PCP: Berniece Salines, FNP (last seen 08/24) Cardiologist: Marcina Millard, MD (last seen during recent admission)  HPI:  Jane Morrison is a 62 y/o female with a history of DM, hyperlipidemia, HTN, anxiety, depression, CAD, tobacco use & chronic heart failure (newly diagnosed)  Was in the ED 02/20/23 due to acute cough. Admitted 03/30/23 due to acute onset of worsening dyspnea, associated orthopnea, chest pressure and cough productive of clear sputum as well as wheezing. Diuresed with IV lasix. NTG drip started and cardiology consulted. Initially needed bipap due to hypoxia but able to be weaned off to room air. Cath done per above. Was in the ED 04/27/23 after a mechanical fall after she tripped while at work at [employer]. Hit her head on the ground but no LOC. Negative imaging. Was in the ED 08/13/23 due to left ankle pain after missing a step up into her truck. Patient went to step up on the running board, missed and landed awkwardly on her left ankle rolling it. Patient reports pain along the lateral aspect. No deformity.  X-ray reveals no acute traumatic finding. Patient is to be placed in a boot for symptom improvement. Anti-inflammatory for additional symptom control. Follow-up with podiatry if symptoms do not improve.   Echo 03/30/23: EF 25-30% with mild/ moderate LVH consistent with Grade I DD and mild MR.   RHC/LHC 04/03/23:   Prox RCA to Mid RCA lesion is 70% stenosed.   Dist RCA lesion is 50% stenosed.   Ost LAD to Prox LAD lesion is 30% stenosed.   There is moderate left ventricular systolic dysfunction.   The left ventricular ejection fraction is 35-45% by visual estimate.  1.  Normal right sided heart pressures (PA 25/7 (13), PCWP 6/4) 2.  Mild to moderate coronary artery disease with 70% stenosis mid RCA 3.  Moderately reduced left ventricular function with estimated LV ejection fraction 35-45%  She presents today with a chief complaint of a HF follow-up visit.     At last visit,  spironolactone was increased to 25mg  daily.  ROS: All systems negative except as listed in HPI, PMH and Problem List.  SH:  Social History   Socioeconomic History   Marital status: Married    Spouse name: Not on file   Number of children: 3   Years of education: Not on file   Highest education level: Associate degree: academic program  Occupational History   Not on file  Tobacco Use   Smoking status: Former    Current packs/day: 0.00    Average packs/day: 0.5 packs/day for 40.0 years (20.0 ttl pk-yrs)    Types: Cigarettes    Start date: 03/29/1983    Quit date: 03/29/2023    Years since quitting: 0.4    Passive exposure: Past   Smokeless tobacco: Never  Vaping Use   Vaping status: Never Used  Substance and Sexual Activity   Alcohol use: No   Drug use: No   Sexual activity: Yes  Other Topics Concern   Not on file  Social History Narrative   3 biological kids 4 stepchildren   Social Determinants of Health   Financial Resource Strain: Low Risk  (07/26/2023)   Overall Financial Resource Strain (CARDIA)    Difficulty of Paying Living Expenses: Not very hard  Food Insecurity: No Food Insecurity (07/26/2023)   Hunger Vital Sign    Worried About Running Out of Food in the Last Year: Never true    Ran Out of Food in the Last  Year: Never true  Transportation Needs: No Transportation Needs (07/26/2023)   PRAPARE - Administrator, Civil Service (Medical): No    Lack of Transportation (Non-Medical): No  Physical Activity: Inactive (07/26/2023)   Exercise Vital Sign    Days of Exercise per Week: 0 days    Minutes of Exercise per Session: 0 min  Stress: No Stress Concern Present (07/26/2023)   [employer] of Occupational Health - Occupational Stress Questionnaire    Feeling of Stress : Only a little  Social Connections: Moderately Integrated (07/26/2023)   Social Connection and Isolation Panel [NHANES]    Frequency of Communication with Friends and Family: More  than three times a week    Frequency of Social Gatherings with Friends and Family: More than three times a week    Attends Religious Services: More than 4 times per year    Active Member of [employer] or Organizations: No    Attends Banker Meetings: Never    Marital Status: Married  Catering manager Violence: Not At Risk (07/26/2023)   Humiliation, Afraid, Rape, and Kick questionnaire    Fear of Current or Ex-Partner: No    Emotionally Abused: No    Physically Abused: No    Sexually Abused: No    FH:  Family History  Problem Relation Age of Onset   Hypertension Mother    Diabetes Mother    Hypertension Father    Diabetes Father    Breast cancer Sister 45    Past Medical History:  Diagnosis Date   Anxiety    CHF (congestive heart failure) (HCC)    Depression    Diabetes mellitus without complication (HCC)    Hyperlipidemia    Hypertension     Current Outpatient Medications  Medication Sig Dispense Refill   Accu-Chek Softclix Lancets lancets Use as instructed (Patient not taking: Reported on 05/17/2023) 100 each 12   aspirin 81 MG chewable tablet Chew 1 tablet (81 mg total) by mouth daily. 90 tablet 0   blood glucose meter kit and supplies KIT Dispense based on patient and insurance preference. Use up to four times daily as directed. (FOR ICD-9 250.00, 250.01). (Patient not taking: Reported on 03/22/2023) 1 each 0   empagliflozin (JARDIANCE) 10 MG TABS tablet Take 1 tablet (10 mg total) by mouth daily before breakfast. 30 tablet 1   Glucagon (GVOKE HYPOPEN 1-PACK) 0.5 MG/0.1ML SOAJ Inject 0.5 mg into the skin once as needed for up to 1 dose (for hypoglycemia). 0.1 mL 3   Glucose Blood (BLOOD GLUCOSE TEST STRIPS) STRP Use as directed to monitor FSBS once daily for DM (Patient not taking: Reported on 03/22/2023) 100 strip 3   insulin degludec (TRESIBA FLEXTOUCH) 100 UNIT/ML FlexTouch Pen Inject 10 Units into the skin daily. 3 mL 3   Insulin Pen Needle (BD PEN NEEDLE MICRO  U/F) 32G X 6 MM MISC Use to administer insulin 100 each 5   meloxicam (MOBIC) 15 MG tablet Take 1 tablet (15 mg total) by mouth daily. 30 tablet 0   metFORMIN (GLUCOPHAGE) 500 MG tablet TAKE 1 TABLET BY MOUTH 2 TIMES DAILY WITH A MEAL. 60 tablet 2   metoprolol succinate (TOPROL-XL) 25 MG 24 hr tablet Take 1 tablet (25 mg total) by mouth daily. 30 tablet 2   NONFORMULARY OR COMPOUNDED ITEM [place] - compounding cream Rx scar cream for fibroma 120gram  3 refills  Faxed in 07/10/23 (Patient not taking: Reported on 07/26/2023)     sacubitril-valsartan (ENTRESTO)  49-51 MG Take 1 tablet by mouth 2 (two) times daily. 60 tablet 5   spironolactone (ALDACTONE) 25 MG tablet Take 1 tablet (25 mg total) by mouth daily. 90 tablet 3   terbinafine (LAMISIL) 250 MG tablet Take 1 tablet (250 mg total) by mouth daily. 30 tablet 0   varenicline (CHANTIX) 1 MG tablet Take 1 tablet (1 mg total) by mouth 2 (two) times daily. 180 tablet 1   No current facility-administered medications for this visit.     PHYSICAL EXAM:  General:  Well appearing. No resp difficulty HEENT: normal Neck: supple. JVP flat.;no bruits.No thyromegaly or lymphadenopathy appreciated. Cor: PMI normal. Regular rate & rhythm. No rubs, gallops or murmurs. Lungs: clear Abdomen: soft, nontender, nondistended. No hepatosplenomegaly. No bruits or masses. Good bowel sounds. Extremities: no cyanosis, clubbing, rash, edema Neuro: alert & oriented x3, cranial nerves grossly intact. Moves all 4 extremities w/o difficulty. Affect pleasant.   ECG: not done   ASSESSMENT & PLAN:  1: NICM with reduced ejection fraction- - etiology likely HTN/ DM - NYHA class I - euvolemic - weighing daily; reminded to call for an overnight weight gain of > 2 pounds or a weekly weight gain of >5 pounds - weight 155.8 pounds from last visit here 1 month ago - Echo 03/30/23: EF 25-30% with mild/ moderate LVH consistent with Grade I DD and mild MR.  - continue  jardiance 10mg  daily  - continue entresto 49/51mg  BID - continue metoprolol succinate 25mg  daily - continue spironolactone 25mg  daily - BMP today - repeat echo after all meds have been titrated as much as possible - BNP 03/30/23 was 471.0 - PharmD reconciled meds w/ patient  2: HTN- - BP  - saw PCP Zane Herald) 08/24 - BMP 07/26/23 showed sodium 138, potassium 5.2, creatinine 1.18 & GFR 53 - BMP today  3: CAD- - saw cardiology (Paraschos) during recent admission; f/u appt had been scheduled for 06/14/23 but she says that his office cancelled it and it hasn't been r/s yet - RHC/LHC 04/03/23:   Prox RCA to Mid RCA lesion is 70% stenosed.   Dist RCA lesion is 50% stenosed.   Ost LAD to Prox LAD lesion is 30% stenosed.   There is moderate left ventricular systolic dysfunction.   The left ventricular ejection fraction is 35-45% by visual estimate.  1.  Normal right sided heart pressures (PA 25/7 (13), PCWP 6/4) 2.  Mild to moderate coronary artery disease with 70% stenosis mid RCA 3.  Moderately reduced left ventricular function with estimated LV ejection fraction 35-45%  4: DM- - A1c 07/26/23 was 10.8%

## 2023-08-23 ENCOUNTER — Encounter: Payer: 59 | Admitting: Family

## 2023-08-23 ENCOUNTER — Telehealth: Payer: Self-pay | Admitting: Family

## 2023-08-23 NOTE — Telephone Encounter (Signed)
Patient did not show for her [facility] appointment on 08/23/23.

## 2023-09-01 ENCOUNTER — Other Ambulatory Visit: Payer: Self-pay

## 2023-09-01 ENCOUNTER — Other Ambulatory Visit (HOSPITAL_COMMUNITY): Payer: Self-pay

## 2023-09-01 ENCOUNTER — Encounter: Payer: Self-pay | Admitting: Podiatry

## 2023-09-02 ENCOUNTER — Other Ambulatory Visit: Payer: Self-pay | Admitting: Nurse Practitioner

## 2023-09-04 ENCOUNTER — Encounter: Payer: Self-pay | Admitting: Podiatry

## 2023-09-04 NOTE — Telephone Encounter (Signed)
Requested Prescriptions  Pending Prescriptions Disp Refills   empagliflozin (JARDIANCE) 10 MG TABS tablet [Pharmacy Med Name: JARDIANCE 10 MG TABLET] 90 tablet 0    Sig: TAKE 1 TABLET BY MOUTH DAILY BEFORE BREAKFAST.     Endocrinology:  Diabetes - SGLT2 Inhibitors Failed - 09/02/2023  8:27 AM      Failed - Cr in normal range and within 360 days    Creat  Date Value Ref Range Status  07/26/2023 1.18 (H) 0.50 - 1.03 mg/dL Final   Creatinine, Urine  Date Value Ref Range Status  03/22/2023 105 20 - 275 mg/dL Final         Failed - HBA1C is between 0 and 7.9 and within 180 days    Hgb A1c MFr Bld  Date Value Ref Range Status  07/26/2023 10.8 (H) <5.7 % of total Hgb Final    Comment:    For someone without known diabetes, a hemoglobin A1c value of 6.5% or greater indicates that they may have  diabetes and this should be confirmed with a follow-up  test. . For someone with known diabetes, a value <7% indicates  that their diabetes is well controlled and a value  greater than or equal to 7% indicates suboptimal  control. A1c targets should be individualized based on  duration of diabetes, age, comorbid conditions, and  other considerations. . Currently, no consensus exists regarding use of hemoglobin A1c for diagnosis of diabetes for children. .          Failed - eGFR in normal range and within 360 days    EGFR (African American)  Date Value Ref Range Status  11/05/2012 >60  Final   GFR calc Af Amer  Date Value Ref Range Status  07/07/2017 >60 >60 mL/min Final    Comment:    (NOTE) The eGFR has been calculated using the CKD EPI equation. This calculation has not been validated in all clinical situations. eGFR's persistently <60 mL/min signify possible Chronic Kidney Disease.    EGFR (Non-African Amer.)  Date Value Ref Range Status  11/05/2012 >60  Final    Comment:    eGFR values <15mL/min/1.73 m2 may be an indication of chronic kidney disease (CKD). Calculated  eGFR is useful in patients with stable renal function. The eGFR calculation will not be reliable in acutely ill patients when serum creatinine is changing rapidly. It is not useful in  patients on dialysis. The eGFR calculation may not be applicable to patients at the low and high extremes of body sizes, pregnant women, and vegetarians.    GFR, Estimated  Date Value Ref Range Status  05/17/2023 56 (L) >60 mL/min Final    Comment:    (NOTE) Calculated using the CKD-EPI Creatinine Equation (2021)    eGFR  Date Value Ref Range Status  07/26/2023 53 (L) > OR = 60 mL/min/1.39m2 Final         Passed - Valid encounter within last 6 months    Recent Outpatient Visits           1 month ago Annual physical exam   [facility] Berniece Salines, FNP   4 months ago Hospital discharge follow-up   [facility] Berniece Salines, FNP   5 months ago Tobacco dependence   [facility] Berniece Salines, FNP   1 year ago Weight loss   [facility] Berniece Salines, [place]  Future Appointments             In 1 month Pender, Rudolpho Sevin, FNP [facility], [facility]

## 2023-09-30 ENCOUNTER — Other Ambulatory Visit: Payer: Self-pay | Admitting: Nurse Practitioner

## 2023-09-30 DIAGNOSIS — E1165 Type 2 diabetes mellitus with hyperglycemia: Secondary | ICD-10-CM

## 2023-10-02 NOTE — Telephone Encounter (Signed)
Requested Prescriptions  Pending Prescriptions Disp Refills   metFORMIN (GLUCOPHAGE) 500 MG tablet [Pharmacy Med Name: METFORMIN HCL 500 MG TABLET] 180 tablet 0    Sig: TAKE 1 TABLET BY MOUTH 2 TIMES DAILY WITH A MEAL.     Endocrinology:  Diabetes - Biguanides Failed - 09/30/2023  9:20 AM      Failed - Cr in normal range and within 360 days    Creat  Date Value Ref Range Status  07/26/2023 1.18 (H) 0.50 - 1.03 mg/dL Final   Creatinine, Urine  Date Value Ref Range Status  03/22/2023 105 20 - 275 mg/dL Final         Failed - HBA1C is between 0 and 7.9 and within 180 days    Hgb A1c MFr Bld  Date Value Ref Range Status  07/26/2023 10.8 (H) <5.7 % of total Hgb Final    Comment:    For someone without known diabetes, a hemoglobin A1c value of 6.5% or greater indicates that they may have  diabetes and this should be confirmed with a follow-up  test. . For someone with known diabetes, a value <7% indicates  that their diabetes is well controlled and a value  greater than or equal to 7% indicates suboptimal  control. A1c targets should be individualized based on  duration of diabetes, age, comorbid conditions, and  other considerations. . Currently, no consensus exists regarding use of hemoglobin A1c for diagnosis of diabetes for children. .          Failed - eGFR in normal range and within 360 days    EGFR (African American)  Date Value Ref Range Status  11/05/2012 >60  Final   GFR calc Af Amer  Date Value Ref Range Status  07/07/2017 >60 >60 mL/min Final    Comment:    (NOTE) The eGFR has been calculated using the CKD EPI equation. This calculation has not been validated in all clinical situations. eGFR's persistently <60 mL/min signify possible Chronic Kidney Disease.    EGFR (Non-African Amer.)  Date Value Ref Range Status  11/05/2012 >60  Final    Comment:    eGFR values <76mL/min/1.73 m2 may be an indication of chronic kidney disease (CKD). Calculated eGFR  is useful in patients with stable renal function. The eGFR calculation will not be reliable in acutely ill patients when serum creatinine is changing rapidly. It is not useful in  patients on dialysis. The eGFR calculation may not be applicable to patients at the low and high extremes of body sizes, pregnant women, and vegetarians.    GFR, Estimated  Date Value Ref Range Status  05/17/2023 56 (L) >60 mL/min Final    Comment:    (NOTE) Calculated using the CKD-EPI Creatinine Equation (2021)    eGFR  Date Value Ref Range Status  07/26/2023 53 (L) > OR = 60 mL/min/1.66m2 Final         Failed - B12 Level in normal range and within 720 days    No results found for: "VITAMINB12"       Passed - Valid encounter within last 6 months    Recent Outpatient Visits           2 months ago Annual physical exam   [facility] Berniece Salines, FNP   5 months ago Hospital discharge follow-up   [facility] Berniece Salines, FNP   6 months ago Tobacco dependence   [facility]  Berniece Salines, FNP   1 year ago Weight loss   [facility] Berniece Salines, FNP       Future Appointments             In 3 weeks Berniece Salines, FNP [facility], [facility]            Passed - CBC within normal limits and completed in the last 12 months    WBC  Date Value Ref Range Status  07/26/2023 5.6 3.8 - 10.8 Thousand/uL Final   RBC  Date Value Ref Range Status  07/26/2023 4.33 3.80 - 5.10 Million/uL Final   Hemoglobin  Date Value Ref Range Status  07/26/2023 13.0 11.7 - 15.5 g/dL Final   HGB  Date Value Ref Range Status  11/05/2012 15.3 12.0 - 16.0 g/dL Final   HCT  Date Value Ref Range Status  07/26/2023 38.5 35.0 - 45.0 % Final  11/05/2012 43.2 35.0 - 47.0 % Final   MCHC  Date Value Ref Range Status  07/26/2023 33.8 32.0 - 36.0 g/dL Final   [facility]  Date Value  Ref Range Status  07/26/2023 30.0 27.0 - 33.0 pg Final   MCV  Date Value Ref Range Status  07/26/2023 88.9 80.0 - 100.0 fL Final  11/05/2012 97 80 - 100 fL Final   No results found for: "PLTCOUNTKUC", "LABPLAT", "POCPLA" RDW  Date Value Ref Range Status  07/26/2023 13.6 11.0 - 15.0 % Final  11/05/2012 13.4 11.5 - 14.5 % Final

## 2023-10-18 ENCOUNTER — Ambulatory Visit: Payer: 59 | Admitting: Podiatry

## 2023-10-25 ENCOUNTER — Ambulatory Visit (INDEPENDENT_AMBULATORY_CARE_PROVIDER_SITE_OTHER): Payer: 59 | Admitting: Nurse Practitioner

## 2023-10-25 ENCOUNTER — Other Ambulatory Visit: Payer: Self-pay

## 2023-10-25 ENCOUNTER — Other Ambulatory Visit: Payer: Self-pay | Admitting: Nurse Practitioner

## 2023-10-25 ENCOUNTER — Encounter: Payer: Self-pay | Admitting: Nurse Practitioner

## 2023-10-25 VITALS — BP 132/80 | HR 65 | Temp 98.0°F | Resp 16 | Ht 60.5 in | Wt 161.7 lb

## 2023-10-25 DIAGNOSIS — Z794 Long term (current) use of insulin: Secondary | ICD-10-CM | POA: Diagnosis not present

## 2023-10-25 DIAGNOSIS — I10 Essential (primary) hypertension: Secondary | ICD-10-CM | POA: Diagnosis not present

## 2023-10-25 DIAGNOSIS — E1165 Type 2 diabetes mellitus with hyperglycemia: Secondary | ICD-10-CM

## 2023-10-25 DIAGNOSIS — F17200 Nicotine dependence, unspecified, uncomplicated: Secondary | ICD-10-CM

## 2023-10-25 DIAGNOSIS — I5022 Chronic systolic (congestive) heart failure: Secondary | ICD-10-CM

## 2023-10-25 LAB — POCT GLYCOSYLATED HEMOGLOBIN (HGB A1C): Hemoglobin A1C: 10.5 % — AB (ref 4.0–5.6)

## 2023-10-25 MED ORDER — TIRZEPATIDE 2.5 MG/0.5ML ~~LOC~~ SOAJ
2.5000 mg | SUBCUTANEOUS | 0 refills | Status: DC
Start: 2023-10-25 — End: 2023-11-08

## 2023-10-25 NOTE — Progress Notes (Signed)
BP 132/80   Pulse 65   Temp 98 F (36.7 C) (Oral)   Resp 16   Ht 5' 0.5" (1.537 m)   Wt 161 lb 11.2 oz (73.3 kg)   SpO2 97%   BMI 31.06 kg/m    Subjective:    Patient ID: Jane Morrison, female    DOB: 11/06/1961, 62 y.o.   MRN: [id number]  HPI: Jane Morrison is a 62 y.o. female  Chief Complaint  Patient presents with   Medical Management of Chronic Issues    3 month follow up    Diabetes, Type 2:  -Last A1c 10.8, today 10.5 -Medications: metformin 500 mg BID, tresiba 12 units daily, jardiance 10 mg daily -Patient is compliant with the above medications and reports no side effects.  -Checking BG at home: has not been checking her blood sugar -Diet: reduce sugar and processed foods in your diet  -Exercise: recommend 150 min of physical activity weekly   -Eye exam: utd -Foot exam: utd -Microalbumin: utd -Statin: yes -PNA vaccine: no -Denies symptoms of hypoglycemia, polyuria, polydipsia, numbness extremities, foot ulcers/trauma.   - will try and get approved for mounjaro  Hypertension/heart failure: Managed by cardiology, last seen on 07/26/2023 -Medications: entresto 49-51 mg BID, metoprolol 25 mg daily, spironolactone 25 mg daily -Patient is compliant with above medications and reports no side effects. -Denies any SOB, CP, vision changes, LE edema or symptoms of hypotension -Diet: recommend DASH diet  -Exercise: recommend 150 min of physical activity weekly       10/25/2023   10:16 AM 08/13/2023    9:45 PM 08/13/2023    8:24 PM  Vitals with BMI  Height 5' 0.5"    Weight 161 lbs 11 oz    BMI 31.05    Systolic 132 138 865  Diastolic 80 77 107  Pulse 65 82      Smoking cessation:  she was prescribed chantix, patient reports. She says she has not smoked a cigarette since April.  She is still taking the chantix.    Relevant past medical, surgical, family and social history reviewed and updated as indicated. Interim medical history since our last visit  reviewed. Allergies and medications reviewed and updated.  Review of Systems  Constitutional: Negative for fever, positive for weight loss Respiratory: Negative for cough and shortness of breath.   Cardiovascular: Negative for chest pain or palpitations.  Gastrointestinal: Negative for abdominal pain, no bowel changes.  Musculoskeletal: Negative for gait problem or joint swelling.  Skin: Negative for rash.  Neurological: Negative for dizziness or headache.  No other specific complaints in a complete review of systems (except as listed in HPI above).      Objective:    BP 132/80   Pulse 65   Temp 98 F (36.7 C) (Oral)   Resp 16   Ht 5' 0.5" (1.537 m)   Wt 161 lb 11.2 oz (73.3 kg)   SpO2 97%   BMI 31.06 kg/m   Wt Readings from Last 3 Encounters:  10/25/23 161 lb 11.2 oz (73.3 kg)  08/13/23 154 lb 5.2 oz (70 kg)  07/26/23 155 lb 4.8 oz (70.4 kg)    Physical Exam  Constitutional: Patient appears well-developed and well-nourished. No distress.  HEENT: head atraumatic, normocephalic, pupils equal and reactive to light, neck supple Cardiovascular: Normal rate, regular rhythm and normal heart sounds.  No murmur heard. No BLE edema. Pulmonary/Chest: Effort normal and breath sounds normal. No respiratory distress. Abdominal: Soft.  There  is no tenderness. Psychiatric: Patient has a normal mood and affect. behavior is normal. Judgment and thought content normal.    Assessment & Plan:   Problem List Items Addressed This Visit       Cardiovascular and Mediastinum   Essential hypertension - Primary    entresto 49-51 mg BID, metoprolol 25 mg daily, spironolactone 25 mg daily      Chronic HFrEF (heart failure with reduced ejection fraction) (HCC)    entresto 49-51 mg BID, metoprolol 25 mg daily, spironolactone 25 mg daily        Endocrine   Type 2 diabetes mellitus with hyperglycemia, with long-term current use of insulin (HCC)    Diabetes still not controlled, will try and  get approved for mounjaro,  continue metformin 500 mg BID, tresiba 12 units daily, jardiance 10 mg daily      Relevant Medications   tirzepatide [facility]) 2.5 MG/0.5ML Pen   Other Relevant Orders   POCT HgB A1C (Completed)     Other   Tobacco dependence    Has not smoked since April.  Continues to use chantix         Follow up plan: Return in about 3 months (around 01/25/2024) for follow up.

## 2023-10-25 NOTE — Assessment & Plan Note (Signed)
entresto 49-51 mg BID, metoprolol 25 mg daily, spironolactone 25 mg daily

## 2023-10-25 NOTE — Assessment & Plan Note (Signed)
Has not smoked since April.  Continues to use chantix

## 2023-10-25 NOTE — Assessment & Plan Note (Signed)
Diabetes still not controlled, will try and get approved for mounjaro,  continue metformin 500 mg BID, tresiba 12 units daily, jardiance 10 mg daily

## 2023-10-26 NOTE — Telephone Encounter (Signed)
Pharmacy comment: Alternative Requested:NOT COVERED.   Requested Prescriptions  Pending Prescriptions Disp Refills   Dulaglutide (TRULICITY) 0.75 MG/0.5ML SOAJ [Pharmacy Med Name: TRULICITY 0.75 MG/0.5 ML PEN]  0     Endocrinology:  Diabetes - GLP-1 Receptor Agonists Failed - 10/25/2023 11:04 AM      Failed - HBA1C is between 0 and 7.9 and within 180 days    Hemoglobin A1C  Date Value Ref Range Status  10/25/2023 10.5 (A) 4.0 - 5.6 % Final   Hgb A1c MFr Bld  Date Value Ref Range Status  07/26/2023 10.8 (H) <5.7 % of total Hgb Final    Comment:    For someone without known diabetes, a hemoglobin A1c value of 6.5% or greater indicates that they may have  diabetes and this should be confirmed with a follow-up  test. . For someone with known diabetes, a value <7% indicates  that their diabetes is well controlled and a value  greater than or equal to 7% indicates suboptimal  control. A1c targets should be individualized based on  duration of diabetes, age, comorbid conditions, and  other considerations. . Currently, no consensus exists regarding use of hemoglobin A1c for diagnosis of diabetes for children. Verna Czech - Valid encounter within last 6 months    Recent Outpatient Visits           Yesterday Essential hypertension   [facility] Berniece Salines, FNP   3 months ago Annual physical exam   [facility] Berniece Salines, FNP   6 months ago Hospital discharge follow-up   [facility] Berniece Salines, FNP   7 months ago Tobacco dependence   [facility] Berniece Salines, FNP   1 year ago Weight loss   [facility] Berniece Salines, FNP       Future Appointments             In 3 months Zane Herald, Rudolpho Sevin, FNP [facility], [facility]

## 2023-10-30 NOTE — Progress Notes (Unsigned)
Name: Jane Morrison   MRN: [id number]    DOB: 07/24/1961   Date:10/31/2023       Progress Note  Subjective  Chief Complaint  Chief Complaint  Patient presents with   URI    Cough, congestion, otc medication Sudafed not working, symptoms going on for 4 days    I connected with  [name]  on 10/31/23 at  9:20 AM EST by a video enabled telemedicine application and verified that I am speaking with the correct person using two identifiers.  I discussed the limitations of evaluation and management by telemedicine and the availability of in person appointments. The patient expressed understanding and agreed to proceed with a virtual visit  Staff also discussed with the patient that there may be a patient responsible charge related to this service. Patient Location: home Provider Location: [facility] Additional Individuals present: alone  HPI  URI Compliant: symptoms started 4 days ago, she woke up Saturday with symptoms -Fever: no -Cough: yes -Shortness of breath: no -Wheezing: no -Chest congestion: yes -Nasal congestion: yes -Runny nose: yes -Post nasal drip: yes -Sore throat: yes -Sinus pressure: yes -Headache: yes -Face pain: yes -Ear pain:  no -Ear pressure: no -Relief with OTC cold/cough medications: sudafed, no relief   Recommend taking zyrtec, flonase, mucinex, vitamin d, vitamin c, and zinc. Push fluids and get rest.     Patient Active Problem List   Diagnosis Date Noted   Acute respiratory failure with hypoxia (HCC) 03/30/2023   Hypertensive urgency 03/30/2023   Uncontrolled type 2 diabetes mellitus with hyperglycemia, with long-term current use of insulin (HCC) 03/30/2023   Chronic HFrEF (heart failure with reduced ejection fraction) (HCC) 03/30/2023   Shortness of breath 03/30/2023   Acute on chronic congestive heart failure (HCC) 03/30/2023   Elevated troponin 03/30/2023   Respiratory distress 03/30/2023   Type 2 diabetes mellitus with hyperglycemia, with  long-term current use of insulin (HCC) 03/22/2023   Tobacco dependence 06/13/2022   Essential hypertension 04/05/2019    Social History   Tobacco Use   Smoking status: Former    Current packs/day: 0.00    Average packs/day: 0.5 packs/day for 40.0 years (20.0 ttl pk-yrs)    Types: Cigarettes    Start date: 03/29/1983    Quit date: 03/29/2023    Years since quitting: 0.5    Passive exposure: Past   Smokeless tobacco: Never  Substance Use Topics   Alcohol use: No     Current Outpatient Medications:    aspirin 81 MG chewable tablet, Chew 1 tablet (81 mg total) by mouth daily., Disp: 90 tablet, Rfl: 0   azithromycin (ZITHROMAX) 250 MG tablet, Take 2 tablets on day 1, then 1 tablet daily on days 2 through 5, Disp: 6 tablet, Rfl: 0   benzonatate (TESSALON) 100 MG capsule, Take 2 capsules (200 mg total) by mouth 2 (two) times daily as needed for cough., Disp: 20 capsule, Rfl: 0   empagliflozin (JARDIANCE) 10 MG TABS tablet, TAKE 1 TABLET BY MOUTH DAILY BEFORE BREAKFAST., Disp: 90 tablet, Rfl: 0   Glucagon (GVOKE HYPOPEN 1-PACK) 0.5 MG/0.1ML SOAJ, Inject 0.5 mg into the skin once as needed for up to 1 dose (for hypoglycemia)., Disp: 0.1 mL, Rfl: 3   insulin degludec (TRESIBA FLEXTOUCH) 100 UNIT/ML FlexTouch Pen, Inject 10 Units into the skin daily., Disp: 3 mL, Rfl: 3   meloxicam (MOBIC) 15 MG tablet, Take 1 tablet (15 mg total) by mouth daily., Disp: 30 tablet, Rfl: 0  metFORMIN (GLUCOPHAGE) 500 MG tablet, TAKE 1 TABLET BY MOUTH 2 TIMES DAILY WITH A MEAL., Disp: 180 tablet, Rfl: 0   metoprolol succinate (TOPROL-XL) 25 MG 24 hr tablet, Take 1 tablet (25 mg total) by mouth daily., Disp: 30 tablet, Rfl: 2   NONFORMULARY OR COMPOUNDED ITEM, [facility] - compounding cream Rx scar cream for fibroma 120gram  3 refills  Faxed in 07/10/23, Disp: , Rfl:    promethazine-dextromethorphan (PROMETHAZINE-DM) 6.25-15 MG/5ML syrup, Take 5 mLs by mouth 4 (four) times daily as needed for cough., Disp: 118 mL, Rfl:  0   sacubitril-valsartan (ENTRESTO) 49-51 MG, Take 1 tablet by mouth 2 (two) times daily., Disp: 60 tablet, Rfl: 5   spironolactone (ALDACTONE) 25 MG tablet, Take 1 tablet (25 mg total) by mouth daily., Disp: 90 tablet, Rfl: 3   terbinafine (LAMISIL) 250 MG tablet, Take 1 tablet (250 mg total) by mouth daily., Disp: 30 tablet, Rfl: 0   tirzepatide (MOUNJARO) 2.5 MG/0.5ML Pen, Inject 2.5 mg into the skin once a week., Disp: 2 mL, Rfl: 0   varenicline (CHANTIX) 1 MG tablet, Take 1 tablet (1 mg total) by mouth 2 (two) times daily., Disp: 180 tablet, Rfl: 1   Accu-Chek Softclix Lancets lancets, Use as instructed (Patient not taking: Reported on 05/17/2023), Disp: 100 each, Rfl: 12   blood glucose meter kit and supplies KIT, Dispense based on patient and insurance preference. Use up to four times daily as directed. (FOR ICD-9 250.00, 250.01). (Patient not taking: Reported on 03/22/2023), Disp: 1 each, Rfl: 0   Glucose Blood (BLOOD GLUCOSE TEST STRIPS) STRP, Use as directed to monitor FSBS once daily for DM (Patient not taking: Reported on 03/22/2023), Disp: 100 strip, Rfl: 3   Insulin Pen Needle (BD PEN NEEDLE MICRO U/F) 32G X 6 MM MISC, Use to administer insulin (Patient not taking: Reported on 10/25/2023), Disp: 100 each, Rfl: 5  No Known Allergies  I personally reviewed active problem list, medication list, allergies with the patient/caregiver today.  ROS  Ten systems reviewed and is negative except as mentioned in HPI    Objective  Virtual encounter, vitals not obtained.  Body mass index is 30.93 kg/m.  Nursing Note and Vital Signs reviewed.  Physical Exam  Awake, alert and oriented.    No results found for this or any previous visit (from the past 72 hour(s)).  Assessment & Plan  1. Viral upper respiratory tract infection Recommend taking zyrtec, flonase, mucinex, vitamin d, vitamin c, and zinc. Push fluids and get rest.    - promethazine-dextromethorphan (PROMETHAZINE-DM) 6.25-15  MG/5ML syrup; Take 5 mLs by mouth 4 (four) times daily as needed for cough.  Dispense: 118 mL; Refill: 0 - benzonatate (TESSALON) 100 MG capsule; Take 2 capsules (200 mg total) by mouth 2 (two) times daily as needed for cough.  Dispense: 20 capsule; Refill: 0 - azithromycin (ZITHROMAX) 250 MG tablet; Take 2 tablets on day 1, then 1 tablet daily on days 2 through 5  Dispense: 6 tablet; Refill: 0   -Red flags and when to present for emergency care or RTC including fever >101.32F, chest pain, shortness of breath, new/worsening/un-resolving symptoms,  reviewed with patient at time of visit. Follow up and care instructions discussed and provided in AVS. - I discussed the assessment and treatment plan with the patient. The patient was provided an opportunity to ask questions and all were answered. The patient agreed with the plan and demonstrated an understanding of the instructions.  I provided 15 minutes of  non-face-to-face time during this encounter.  Berniece Salines, FNP

## 2023-10-31 ENCOUNTER — Encounter: Payer: Self-pay | Admitting: Nurse Practitioner

## 2023-10-31 ENCOUNTER — Other Ambulatory Visit: Payer: Self-pay

## 2023-10-31 ENCOUNTER — Telehealth (INDEPENDENT_AMBULATORY_CARE_PROVIDER_SITE_OTHER): Payer: 59 | Admitting: Nurse Practitioner

## 2023-10-31 VITALS — Ht 60.5 in | Wt 161.0 lb

## 2023-10-31 DIAGNOSIS — J069 Acute upper respiratory infection, unspecified: Secondary | ICD-10-CM

## 2023-10-31 MED ORDER — AZITHROMYCIN 250 MG PO TABS
ORAL_TABLET | ORAL | 0 refills | Status: AC
Start: 2023-10-31 — End: 2023-11-05

## 2023-10-31 MED ORDER — BENZONATATE 100 MG PO CAPS
200.0000 mg | ORAL_CAPSULE | Freq: Two times a day (BID) | ORAL | 0 refills | Status: DC | PRN
Start: 2023-10-31 — End: 2024-01-10

## 2023-10-31 MED ORDER — PROMETHAZINE-DM 6.25-15 MG/5ML PO SYRP
5.0000 mL | ORAL_SOLUTION | Freq: Four times a day (QID) | ORAL | 0 refills | Status: DC | PRN
Start: 2023-10-31 — End: 2024-03-13

## 2023-11-02 ENCOUNTER — Encounter: Payer: Self-pay | Admitting: Nurse Practitioner

## 2023-11-02 ENCOUNTER — Ambulatory Visit: Payer: Self-pay | Admitting: *Deleted

## 2023-11-02 NOTE — Telephone Encounter (Signed)
Spoke to patient would like dr.note extended until Monday. Still coughing and congested

## 2023-11-02 NOTE — Telephone Encounter (Signed)
Message from Lennox Pippins sent at 11/02/2023 10:34 AM EST  Summary: antibiotic not helping   Patient had a virtual visit with Della Goo on Tuesday 10/31/2023 and states she was prescribed an antibiotic and it is not helping. Patient states she is still experiencing still stopped up & congested, blowing out stuff, voice in & out.  Offered patient with Della Goo tooday and patient states she would rather me send nurse a message since she was just seen on Tuesday.    Patient's call back #  [phone]          Call History  Contact Date/Time Type Contact Phone/Fax User  11/02/2023 10:32 AM EST Phone (Incoming) Jane Morrison, Jane Morrison (Self) [phone] Rexene Edison) [place], [place]   Reason for Disposition  [1] Nasal discharge AND [2] present > 10 days    Seen by Raynelle Fanning on 10/31/2023.   Not much better on antibiotic.   Missing work.  Answer Assessment - Initial Assessment Questions 1. LOCATION: "Where does it hurt?"      I'm not any better.   I saw Della Goo on 10/31/2023 for congestion and my voice going out.    My doctor's note ran out on Wed.   I'm having to take days off.   I'm missing work.       I have 2 more tablets of the antibiotic.    The [employer] are helping a little with the cough.   I'm using the cough suppressant she prescribed also.   My voice is better but I'm still having a hard time with talking very much.  2. ONSET: "When did the sinus pain start?"  (e.g., hours, days)      Since being evaluated on 10/31/2023.   Not much improved 3. SEVERITY: "How bad is the pain?"   (Scale 1-10; mild, moderate or severe)   - MILD (1-3): doesn't interfere with normal activities    - MODERATE (4-7): interferes with normal activities (e.g., work or school) or awakens from sleep   - SEVERE (8-10): excruciating pain and patient unable to do any normal activities        Moderate 4. RECURRENT SYMPTOM: "Have you ever had sinus problems before?" If Yes, ask: "When was the last time?" and  "What happened that time?"      Not asked since been evaluated for this. 5. NASAL CONGESTION: "Is the nose blocked?" If Yes, ask: "Can you open it or must you breathe through your mouth?"     Yes Still having nasal congestion. 6. NASAL DISCHARGE: "Do you have discharge from your nose?" If so ask, "What color?"     Yes   I'm still blowing stuff out. 7. FEVER: "Do you have a fever?" If Yes, ask: "What is it, how was it measured, and when did it start?"      No 8. OTHER SYMPTOMS: "Do you have any other symptoms?" (e.g., sore throat, cough, earache, difficulty breathing)     Voice is still real hoarse but better.   9. PREGNANCY: "Is there any chance you are pregnant?" "When was your last menstrual period?"     N/A due to age  Protocols used: Sinus Pain or Congestion-A-AH

## 2023-11-02 NOTE — Telephone Encounter (Signed)
  Chief Complaint: Did a video visit with Jane Goo, FNP on 10/31/2023 for URI. Symptoms: Not feeling much better.   Her work note ran out on [employer]. And she's still having to miss work.  She is a little better but not much.   Stilling have sinus congestion and her voice is still hoarse but it's better.   Has 2 days left of her antibiotics. Frequency: Since video visit on 11/19. Pertinent Negatives: Patient denies N/A Disposition: [] ED /[] Urgent Care (no appt availability in office) / [] Appointment(In office/virtual)/ []  [facility]/ [] Home Care/ [] Refused Recommended Disposition /[] [facility]/ [x]  Follow-up with PCP Additional Notes: Message sent to Jane Goo, FNP.    Pt. Agreeable to someone calling her back with further directions.    She also requested a doctor's note to be out of work.   She would prefer to come pick it up rather than receive it via MyChart.

## 2023-11-02 NOTE — Telephone Encounter (Signed)
Note written at front desk for pick up

## 2023-11-07 NOTE — Progress Notes (Unsigned)
PCP: Berniece Salines, FNP (last seen 11/24) Cardiologist: Marcina Millard, MD (last seen during 04/24 admission)  HPI:  Jane Morrison is a 62 y/o female with a history of DM, hyperlipidemia, HTN, anxiety, depression, CAD, tobacco use & chronic heart failure (newly diagnosed)  Was in the ED 02/20/23 due to acute cough. Admitted 03/30/23 due to acute onset of worsening dyspnea, associated orthopnea, chest pressure and cough productive of clear sputum as well as wheezing. Diuresed with IV lasix. NTG drip started and cardiology consulted. Initially needed bipap due to hypoxia but able to be weaned off to room air. Cath done per above. Was in the ED 04/27/23 after a mechanical fall after she tripped while at work at [employer]. Hit her head on the ground but no LOC. Negative imaging.   Echo 03/30/23: EF 25-30% with mild/ moderate LVH consistent with Grade I DD and mild MR.   RHC/LHC 04/03/23:   Prox RCA to Mid RCA lesion is 70% stenosed.   Dist RCA lesion is 50% stenosed.   Ost LAD to Prox LAD lesion is 30% stenosed.   There is moderate left ventricular systolic dysfunction.   The left ventricular ejection fraction is 35-45% by visual estimate.  1.  Normal right sided heart pressures (PA 25/7 (13), PCWP 6/4) 2.  Mild to moderate coronary artery disease with 70% stenosis mid RCA 3.  Moderately reduced left ventricular function with estimated LV ejection fraction 35-45%  She presents today for a HF follow-up visit with a chief complaint of minimal fatigue with moderate exertion and tends to occur after she's worked all day. Has no other symptoms and specifically denies shortness of breath, chest pain, cough, palpitations, abdominal distention, pedal edema, dizziness or difficulty sleeping. Has not smoked since 04/24.  She says that her PCP wanted to begin mounjaro to aid in getting her A1c down but patient declined. She says that she tries to be careful with what she eats and thinks she may need to  increase her activity. Isn't able to check her glucose at home because she says that her glucometer doesn't work even with new batteries in in.   At last visit, spironolactone was increased to 25mg  daily and she doesn't notice any issues with taking this.  ROS: All systems negative except as listed in HPI, PMH and Problem List.  SH:  Social History   Socioeconomic History   Marital status: Married    Spouse name: Not on file   Number of children: 3   Years of education: Not on file   Highest education level: Associate degree: academic program  Occupational History   Not on file  Tobacco Use   Smoking status: Former    Current packs/day: 0.00    Average packs/day: 0.5 packs/day for 40.0 years (20.0 ttl pk-yrs)    Types: Cigarettes    Start date: 03/29/1983    Quit date: 03/29/2023    Years since quitting: 0.6    Passive exposure: Past   Smokeless tobacco: Never  Vaping Use   Vaping status: Never Used  Substance and Sexual Activity   Alcohol use: No   Drug use: No   Sexual activity: Yes  Other Topics Concern   Not on file  Social History Narrative   3 biological kids 4 stepchildren   Social Determinants of Health   Financial Resource Strain: Low Risk  (10/23/2023)   Overall Financial Resource Strain (CARDIA)    Difficulty of Paying Living Expenses: Not hard at all  Food Insecurity: No Food Insecurity (10/23/2023)   Hunger Vital Sign    Worried About Running Out of Food in the Last Year: Never true    Ran Out of Food in the Last Year: Never true  Transportation Needs: No Transportation Needs (10/23/2023)   PRAPARE - Administrator, Civil Service (Medical): No    Lack of Transportation (Non-Medical): No  Physical Activity: Sufficiently Active (10/23/2023)   Exercise Vital Sign    Days of Exercise per Week: 6 days    Minutes of Exercise per Session: 90 min  Recent Concern: Physical Activity - Inactive (07/26/2023)   Exercise Vital Sign    Days of Exercise  per Week: 0 days    Minutes of Exercise per Session: 0 min  Stress: No Stress Concern Present (10/23/2023)   [employer] of Occupational Health - Occupational Stress Questionnaire    Feeling of Stress : Not at all  Social Connections: Socially Integrated (10/23/2023)   Social Connection and Isolation Panel [NHANES]    Frequency of Communication with Friends and Family: More than three times a week    Frequency of Social Gatherings with Friends and Family: Twice a week    Attends Religious Services: More than 4 times per year    Active Member of [employer] or Organizations: Yes    Attends Banker Meetings: 1 to 4 times per year    Marital Status: Married  Catering manager Violence: Not At Risk (07/26/2023)   Humiliation, Afraid, Rape, and Kick questionnaire    Fear of Current or Ex-Partner: No    Emotionally Abused: No    Physically Abused: No    Sexually Abused: No    FH:  Family History  Problem Relation Age of Onset   Hypertension Mother    Diabetes Mother    Hypertension Father    Diabetes Father    Breast cancer Sister 23    Past Medical History:  Diagnosis Date   Anxiety    CHF (congestive heart failure) (HCC)    Depression    Diabetes mellitus without complication (HCC)    Hyperlipidemia    Hypertension     Current Outpatient Medications  Medication Sig Dispense Refill   Accu-Chek Softclix Lancets lancets Use as instructed (Patient not taking: Reported on 05/17/2023) 100 each 12   aspirin 81 MG chewable tablet Chew 1 tablet (81 mg total) by mouth daily. 90 tablet 0   benzonatate (TESSALON) 100 MG capsule Take 2 capsules (200 mg total) by mouth 2 (two) times daily as needed for cough. 20 capsule 0   blood glucose meter kit and supplies KIT Dispense based on patient and insurance preference. Use up to four times daily as directed. (FOR ICD-9 250.00, 250.01). (Patient not taking: Reported on 03/22/2023) 1 each 0   empagliflozin (JARDIANCE) 10 MG TABS tablet  TAKE 1 TABLET BY MOUTH DAILY BEFORE BREAKFAST. 90 tablet 0   Glucagon (GVOKE HYPOPEN 1-PACK) 0.5 MG/0.1ML SOAJ Inject 0.5 mg into the skin once as needed for up to 1 dose (for hypoglycemia). 0.1 mL 3   Glucose Blood (BLOOD GLUCOSE TEST STRIPS) STRP Use as directed to monitor FSBS once daily for DM (Patient not taking: Reported on 03/22/2023) 100 strip 3   insulin degludec (TRESIBA FLEXTOUCH) 100 UNIT/ML FlexTouch Pen Inject 10 Units into the skin daily. 3 mL 3   Insulin Pen Needle (BD PEN NEEDLE MICRO U/F) 32G X 6 MM MISC Use to administer insulin (Patient not taking: Reported on  10/25/2023) 100 each 5   meloxicam (MOBIC) 15 MG tablet Take 1 tablet (15 mg total) by mouth daily. 30 tablet 0   metFORMIN (GLUCOPHAGE) 500 MG tablet TAKE 1 TABLET BY MOUTH 2 TIMES DAILY WITH A MEAL. 180 tablet 0   metoprolol succinate (TOPROL-XL) 25 MG 24 hr tablet Take 1 tablet (25 mg total) by mouth daily. 30 tablet 2   NONFORMULARY OR COMPOUNDED ITEM [facility] - compounding cream Rx scar cream for fibroma 120gram  3 refills  Faxed in 07/10/23     promethazine-dextromethorphan (PROMETHAZINE-DM) 6.25-15 MG/5ML syrup Take 5 mLs by mouth 4 (four) times daily as needed for cough. 118 mL 0   sacubitril-valsartan (ENTRESTO) 49-51 MG Take 1 tablet by mouth 2 (two) times daily. 60 tablet 5   spironolactone (ALDACTONE) 25 MG tablet Take 1 tablet (25 mg total) by mouth daily. 90 tablet 3   terbinafine (LAMISIL) 250 MG tablet Take 1 tablet (250 mg total) by mouth daily. 30 tablet 0   tirzepatide (MOUNJARO) 2.5 MG/0.5ML Pen Inject 2.5 mg into the skin once a week. 2 mL 0   varenicline (CHANTIX) 1 MG tablet Take 1 tablet (1 mg total) by mouth 2 (two) times daily. 180 tablet 1   No current facility-administered medications for this visit.   Vitals:   11/08/23 0931  BP: (!) 153/87  Pulse: 78  SpO2: 95%  Weight: 165 lb (74.8 kg)   Wt Readings from Last 3 Encounters:  11/08/23 165 lb (74.8 kg)  10/31/23 161 lb (73 kg)   10/25/23 161 lb 11.2 oz (73.3 kg)   Lab Results  Component Value Date   CREATININE 1.18 (H) 07/26/2023   CREATININE 1.13 (H) 05/17/2023   CREATININE 1.03 04/17/2023    PHYSICAL EXAM:  General:  Well appearing. No resp difficulty HEENT: normal Neck: supple. JVP flat. No thyromegaly or lymphadenopathy appreciated. Cor: PMI normal. Regular rate & rhythm. No rubs, gallops or murmurs. Lungs: clear Abdomen: soft, nontender, nondistended. No hepatosplenomegaly. No bruits or masses. Good bowel sounds. Extremities: no cyanosis, clubbing, rash, edema Neuro: alert & oriented x3, cranial nerves grossly intact. Moves all 4 extremities w/o difficulty. Affect pleasant.   ECG: not done   ASSESSMENT & PLAN:  1: NICM with reduced ejection fraction- - etiology likely HTN/ DM - NYHA class II - euvolemic - weighing daily; reminded to call for an overnight weight gain of > 2 pounds or a weekly weight gain of >5 pounds - weight up 10 pounds from last visit here 3 months ago - Echo 03/30/23: EF 25-30% with mild/ moderate LVH consistent with Grade I DD and mild MR.  - will get echo updated once GDMT is optimized - continue jardiance 10mg  daily  - continue entresto 49/51mg  BID - stop metoprolol and begin carvedilol 6.25mg  BID for better BP control - continue spironolactone 25mg  daily - BMP today - BNP 03/30/23 was 471.0  2: HTN- - BP 153/87 - changing beta blocker to carvedilol per above - had video visit with PCP Jane Morrison) 11/24 - BMP 07/26/23 showed sodium 138, potassium 5.2, creatinine 1.18 & GFR 53  3: CAD- - saw cardiology (Jane Morrison) during recent admission; f/u appt had been scheduled for 06/14/23 but she says that his office cancelled it and it hasn't been r/s yet - LDL 03/31/23 was 93 - RHC/LHC 04/03/23:   Prox RCA to Mid RCA lesion is 70% stenosed.   Dist RCA lesion is 50% stenosed.   Ost LAD to Prox LAD lesion is 30%  stenosed.   There is moderate left ventricular systolic  dysfunction.   The left ventricular ejection fraction is 35-45% by visual estimate.  1.  Normal right sided heart pressures (PA 25/7 (13), PCWP 6/4) 2.  Mild to moderate coronary artery disease with 70% stenosis mid RCA 3.  Moderately reduced left ventricular function with estimated LV ejection fraction 35-45%  4: DM- - A1c 10/25/23 was 10.5% - she voices frustration about her inability to get her A1c down - discussed [facility] referral and she is agreeable so referral placed today - instructed to take her glucometer to them to see if they can figure out why it's not working   Return in 1 month, sooner if needed.

## 2023-11-08 ENCOUNTER — Ambulatory Visit: Payer: 59 | Attending: Family | Admitting: Family

## 2023-11-08 ENCOUNTER — Encounter: Payer: Self-pay | Admitting: Family

## 2023-11-08 ENCOUNTER — Other Ambulatory Visit: Payer: Self-pay

## 2023-11-08 VITALS — BP 153/87 | HR 78 | Wt 165.0 lb

## 2023-11-08 DIAGNOSIS — I5022 Chronic systolic (congestive) heart failure: Secondary | ICD-10-CM | POA: Insufficient documentation

## 2023-11-08 DIAGNOSIS — Z79899 Other long term (current) drug therapy: Secondary | ICD-10-CM | POA: Diagnosis not present

## 2023-11-08 DIAGNOSIS — Z7985 Long-term (current) use of injectable non-insulin antidiabetic drugs: Secondary | ICD-10-CM | POA: Insufficient documentation

## 2023-11-08 DIAGNOSIS — Z87891 Personal history of nicotine dependence: Secondary | ICD-10-CM | POA: Insufficient documentation

## 2023-11-08 DIAGNOSIS — F419 Anxiety disorder, unspecified: Secondary | ICD-10-CM | POA: Insufficient documentation

## 2023-11-08 DIAGNOSIS — E785 Hyperlipidemia, unspecified: Secondary | ICD-10-CM | POA: Insufficient documentation

## 2023-11-08 DIAGNOSIS — Z7984 Long term (current) use of oral hypoglycemic drugs: Secondary | ICD-10-CM | POA: Insufficient documentation

## 2023-11-08 DIAGNOSIS — I428 Other cardiomyopathies: Secondary | ICD-10-CM | POA: Diagnosis not present

## 2023-11-08 DIAGNOSIS — F32A Depression, unspecified: Secondary | ICD-10-CM | POA: Diagnosis not present

## 2023-11-08 DIAGNOSIS — E1165 Type 2 diabetes mellitus with hyperglycemia: Secondary | ICD-10-CM | POA: Insufficient documentation

## 2023-11-08 DIAGNOSIS — I110 Hypertensive heart disease with heart failure: Secondary | ICD-10-CM | POA: Diagnosis not present

## 2023-11-08 DIAGNOSIS — E119 Type 2 diabetes mellitus without complications: Secondary | ICD-10-CM | POA: Diagnosis not present

## 2023-11-08 DIAGNOSIS — I2510 Atherosclerotic heart disease of native coronary artery without angina pectoris: Secondary | ICD-10-CM | POA: Insufficient documentation

## 2023-11-08 MED ORDER — CARVEDILOL 6.25 MG PO TABS
6.2500 mg | ORAL_TABLET | Freq: Two times a day (BID) | ORAL | 3 refills | Status: DC
  Filled 2023-11-08: qty 60, 30d supply, fill #0
  Filled 2023-12-06: qty 60, 30d supply, fill #1

## 2023-11-08 NOTE — Patient Instructions (Signed)
DISCONTINUE METOPROLOL  START TAKING CARVEDILOL 6.25 MG TWICE DAILY (2 TABLETS DAILY- AM AND PM) TOMORROW  Go DOWN to LOWER LEVEL (LL) to have your blood work completed inside of [employer] office.  We will only call you if the results are abnormal or if the provider would like to make medication changes.

## 2023-11-09 LAB — BASIC METABOLIC PANEL
BUN/Creatinine Ratio: 11 — ABNORMAL LOW (ref 12–28)
BUN: 11 mg/dL (ref 8–27)
CO2: 22 mmol/L (ref 20–29)
Calcium: 8.7 mg/dL (ref 8.7–10.3)
Chloride: 104 mmol/L (ref 96–106)
Creatinine, Ser: 0.99 mg/dL (ref 0.57–1.00)
Glucose: 335 mg/dL — ABNORMAL HIGH (ref 70–99)
Potassium: 5 mmol/L (ref 3.5–5.2)
Sodium: 139 mmol/L (ref 134–144)
eGFR: 65 mL/min/{1.73_m2} (ref >59)

## 2023-12-06 ENCOUNTER — Other Ambulatory Visit: Payer: Self-pay | Admitting: Nurse Practitioner

## 2023-12-07 ENCOUNTER — Other Ambulatory Visit: Payer: Self-pay

## 2023-12-07 MED ORDER — EMPAGLIFLOZIN 10 MG PO TABS
10.0000 mg | ORAL_TABLET | Freq: Every day | ORAL | 0 refills | Status: DC
  Filled 2023-12-07: qty 30, 30d supply, fill #0

## 2023-12-19 NOTE — Progress Notes (Signed)
 PCP: Gareth Mliss FALCON, FNP (last seen 11/24) Cardiologist: Ammon Blunt, MD (last seen during 04/24 admission)  Chief Complaint: Fatigue  HPI:  Jane Morrison is a 63 y/o female with a history of DM, hyperlipidemia, HTN, anxiety, depression, CAD, tobacco use & chronic heart failure (newly diagnosed)  Was in the ED 02/20/23 due to acute cough. Admitted 03/30/23 due to acute onset of worsening dyspnea, associated orthopnea, chest pressure and cough productive of clear sputum as well as wheezing. Diuresed with IV lasix . NTG drip started and cardiology consulted. Initially needed bipap due to hypoxia but able to be weaned off to room air. Cath done per above. Was in the ED 04/27/23 after a mechanical fall after she tripped while at work at [employer]. Hit her head on the ground but no LOC. Negative imaging.   Echo 03/30/23: EF 25-30% with mild/ moderate LVH consistent with Grade I DD and mild MR.   RHC/LHC 04/03/23:   Prox RCA to Mid RCA lesion is 70% stenosed.   Dist RCA lesion is 50% stenosed.   Ost LAD to Prox LAD lesion is 30% stenosed.   There is moderate left ventricular systolic dysfunction.   The left ventricular ejection fraction is 35-45% by visual estimate.  1.  Normal right sided heart pressures (PA 25/7 (13), PCWP 6/4) 2.  Mild to moderate coronary artery disease with 70% stenosis mid RCA 3.  Moderately reduced left ventricular function with estimated LV ejection fraction 35-45%  She presents today for a HF follow-up visit with a chief complaint of moderate fatigue which has worsened since she opened up a new [employer] on 12/02/23. Since that time, she's been working 7 days/ week with 10 hour days from open to close. Will be having a health inspection sometime this month and then hopes she can back off on her working. She has some difficulty sleeping because even though she's exhausted, her mind is racing with everything that needs to be done. Denies shortness of breath, chest pain,  cough, palpitations, abdominal distention, pedal edema, dizziness or weight gain.   Hasn't smoked in ~ 1 year but wants to continue chantix  until she gets through this stressful work period as she says that she's afraid she will resume smoking due to the stress. Eating well but admits that she's been eating more food from [employer] because that's where she's spending the majority of her time.   At last visit her beta blocker was changed to carvedilol  and she doesn't notice any issues with this. She has been out of it for the last 2 days as she needs a new RX for this.   ROS: All systems negative except as listed in HPI, PMH and Problem List.  SH:  Social History   Socioeconomic History   Marital status: Married    Spouse name: Not on file   Number of children: 3   Years of education: Not on file   Highest education level: Associate degree: academic program  Occupational History   Not on file  Tobacco Use   Smoking status: Former    Current packs/day: 0.00    Average packs/day: 0.5 packs/day for 40.0 years (20.0 ttl pk-yrs)    Types: Cigarettes    Start date: 03/29/1983    Quit date: 03/29/2023    Years since quitting: 0.7    Passive exposure: Past   Smokeless tobacco: Never  Vaping Use   Vaping status: Never Used  Substance and Sexual Activity   Alcohol use:  No   Drug use: No   Sexual activity: Yes  Other Topics Concern   Not on file  Social History Narrative   3 biological kids 4 stepchildren   Social Drivers of Health   Financial Resource Strain: Low Risk  (10/23/2023)   Overall Financial Resource Strain (CARDIA)    Difficulty of Paying Living Expenses: Not hard at all  Food Insecurity: No Food Insecurity (10/23/2023)   Hunger Vital Sign    Worried About Running Out of Food in the Last Year: Never true    Ran Out of Food in the Last Year: Never true  Transportation Needs: No Transportation Needs (10/23/2023)   PRAPARE - Administrator, Civil Service  (Medical): No    Lack of Transportation (Non-Medical): No  Physical Activity: Sufficiently Active (10/23/2023)   Exercise Vital Sign    Days of Exercise per Week: 6 days    Minutes of Exercise per Session: 90 min  Recent Concern: Physical Activity - Inactive (07/26/2023)   Exercise Vital Sign    Days of Exercise per Week: 0 days    Minutes of Exercise per Session: 0 min  Stress: No Stress Concern Present (10/23/2023)   [employer] of Occupational Health - Occupational Stress Questionnaire    Feeling of Stress : Not at all  Social Connections: Socially Integrated (10/23/2023)   Social Connection and Isolation Panel [NHANES]    Frequency of Communication with Friends and Family: More than three times a week    Frequency of Social Gatherings with Friends and Family: Twice a week    Attends Religious Services: More than 4 times per year    Active Member of [employer] or Organizations: Yes    Attends Banker Meetings: 1 to 4 times per year    Marital Status: Married  Catering Manager Violence: Not At Risk (07/26/2023)   Humiliation, Afraid, Rape, and Kick questionnaire    Fear of Current or Ex-Partner: No    Emotionally Abused: No    Physically Abused: No    Sexually Abused: No    FH:  Family History  Problem Relation Age of Onset   Hypertension Mother    Diabetes Mother    Hypertension Father    Diabetes Father    Breast cancer Sister 90    Past Medical History:  Diagnosis Date   Anxiety    CHF (congestive heart failure) (HCC)    Depression    Diabetes mellitus without complication (HCC)    Hyperlipidemia    Hypertension     Current Outpatient Medications  Medication Sig Dispense Refill   Accu-Chek Softclix Lancets lancets Use as instructed 100 each 12   aspirin  81 MG chewable tablet Chew 1 tablet (81 mg total) by mouth daily. 90 tablet 0   benzonatate  (TESSALON ) 100 MG capsule Take 2 capsules (200 mg total) by mouth 2 (two) times daily as needed for  cough. 20 capsule 0   blood glucose meter kit and supplies KIT Dispense based on patient and insurance preference. Use up to four times daily as directed. (FOR ICD-9 250.00, 250.01). (Patient not taking: Reported on 03/22/2023) 1 each 0   carvedilol  (COREG ) 6.25 MG tablet Take 1 tablet (6.25 mg total) by mouth 2 (two) times daily. 180 tablet 3   Dulaglutide  (TRULICITY ) 0.75 MG/0.5ML SOAJ Inject 0.75 mg into the skin once a week. 2 mL 0   empagliflozin  (JARDIANCE ) 10 MG TABS tablet Take 1 tablet (10 mg total) by mouth daily before  breakfast. 90 tablet 0   Glucagon  (GVOKE HYPOPEN  1-PACK) 0.5 MG/0.1ML SOAJ Inject 0.5 mg into the skin once as needed for up to 1 dose (for hypoglycemia). 0.1 mL 3   Glucose Blood (BLOOD GLUCOSE TEST STRIPS) STRP Use as directed to monitor FSBS once daily for DM (Patient not taking: Reported on 03/22/2023) 100 strip 3   insulin  degludec (TRESIBA  FLEXTOUCH) 100 UNIT/ML FlexTouch Pen Inject 10 Units into the skin daily. (Patient taking differently: Inject 12 Units into the skin daily.) 3 mL 3   Insulin  Pen Needle (BD PEN NEEDLE MICRO U/F) 32G X 6 MM MISC Use to administer insulin  (Patient not taking: Reported on 10/25/2023) 100 each 5   meloxicam  (MOBIC ) 15 MG tablet Take 1 tablet (15 mg total) by mouth daily. 30 tablet 0   metFORMIN  (GLUCOPHAGE ) 500 MG tablet TAKE 1 TABLET BY MOUTH 2 TIMES DAILY WITH A MEAL. 180 tablet 0   metoprolol  succinate (TOPROL -XL) 25 MG 24 hr tablet Take 1 tablet (25 mg total) by mouth daily. 30 tablet 2   promethazine -dextromethorphan (PROMETHAZINE -DM) 6.25-15 MG/5ML syrup Take 5 mLs by mouth 4 (four) times daily as needed for cough. 118 mL 0   sacubitril -valsartan  (ENTRESTO ) 49-51 MG Take 1 tablet by mouth 2 (two) times daily. 60 tablet 5   spironolactone  (ALDACTONE ) 25 MG tablet Take 1 tablet (25 mg total) by mouth daily. 90 tablet 3   varenicline  (CHANTIX ) 1 MG tablet Take 1 tablet (1 mg total) by mouth 2 (two) times daily. 180 tablet 1   No  current facility-administered medications for this visit.   Vitals:   12/20/23 0844  BP: (!) 152/84  Pulse: 79  SpO2: 98%  Weight: 162 lb (73.5 kg)   Wt Readings from Last 3 Encounters:  12/20/23 162 lb (73.5 kg)  11/08/23 165 lb (74.8 kg)  10/31/23 161 lb (73 kg)   Lab Results  Component Value Date   CREATININE 0.99 11/08/2023   CREATININE 1.18 (H) 07/26/2023   CREATININE 1.13 (H) 05/17/2023    PHYSICAL EXAM:  General:  Well appearing. No resp difficulty HEENT: normal Neck: supple. JVP flat. No thyromegaly or lymphadenopathy appreciated. Cor: PMI normal. Regular rate & rhythm. No rubs, gallops or murmurs. Lungs: clear Abdomen: soft, nontender, nondistended. No hepatosplenomegaly. No bruits or masses. Good bowel sounds. Extremities: no cyanosis, clubbing, rash, edema Neuro: alert & oriented x3, cranial nerves grossly intact. Moves all 4 extremities w/o difficulty. Affect pleasant.   ECG: not done   ASSESSMENT & PLAN:  1: NICM with reduced ejection fraction- - etiology likely HTN/ DM - NYHA class II - euvolemic - weighing daily; reminded to call for an overnight weight gain of > 2 pounds or a weekly weight gain of >5 pounds - weight down 3 pounds from last visit here 6 weeks ago - Echo 03/30/23: EF 25-30% with mild/ moderate LVH consistent with Grade I DD and mild MR.  - will get echo updated once GDMT is optimized - continue jardiance  10mg  daily  - continue entresto  49/51mg  BID (discussed increasing this next visit) - increase carvedilol  to 12.5mg  BID - continue spironolactone  25mg  daily - BMP next visit - BNP 03/30/23 was 471.0  2: HTN- - BP 152/84 (increased stress as waiting on health inspection at work) - has been out of carvedilol  for last 2 days; increasing dose to 12.5mg  BID - had video visit with PCP Murlean) 11/24 - BMP 11/08/23 showed sodium 139, potassium 5.0, creatinine 0.99 & GFR 65 - BMET next  visit  3: CAD- - saw cardiology (Paraschos) during  recent admission; f/u appt had been scheduled for 06/14/23 but she says that his office cancelled it and it hasn't been r/s yet - LDL 03/31/23 was 93 - RHC/LHC 04/03/23:   Prox RCA to Mid RCA lesion is 70% stenosed.   Dist RCA lesion is 50% stenosed.   Ost LAD to Prox LAD lesion is 30% stenosed.   There is moderate left ventricular systolic dysfunction.   The left ventricular ejection fraction is 35-45% by visual estimate.  1.  Normal right sided heart pressures (PA 25/7 (13), PCWP 6/4) 2.  Mild to moderate coronary artery disease with 70% stenosis mid RCA 3.  Moderately reduced left ventricular function with estimated LV ejection fraction 35-45%  4: DM- - A1c 10/25/23 was 10.5% - she voices frustration about her inability to get her A1c down  5: Tobacco use- - no smoking  for ~ 1 year - she asks to stay on chantix  until her health inspection at work gets done as she is under considerable stress right now  Return in 3-4 weeks, sooner if needed.

## 2023-12-20 ENCOUNTER — Other Ambulatory Visit (HOSPITAL_COMMUNITY): Payer: Self-pay

## 2023-12-20 ENCOUNTER — Ambulatory Visit: Payer: 59 | Attending: Family | Admitting: Family

## 2023-12-20 ENCOUNTER — Encounter: Payer: Self-pay | Admitting: Family

## 2023-12-20 ENCOUNTER — Other Ambulatory Visit: Payer: Self-pay

## 2023-12-20 ENCOUNTER — Ambulatory Visit: Payer: 59 | Admitting: Podiatry

## 2023-12-20 VITALS — BP 152/84 | HR 79 | Wt 162.0 lb

## 2023-12-20 DIAGNOSIS — Z7984 Long term (current) use of oral hypoglycemic drugs: Secondary | ICD-10-CM | POA: Diagnosis not present

## 2023-12-20 DIAGNOSIS — Z794 Long term (current) use of insulin: Secondary | ICD-10-CM | POA: Insufficient documentation

## 2023-12-20 DIAGNOSIS — E785 Hyperlipidemia, unspecified: Secondary | ICD-10-CM | POA: Diagnosis not present

## 2023-12-20 DIAGNOSIS — I10 Essential (primary) hypertension: Secondary | ICD-10-CM | POA: Diagnosis not present

## 2023-12-20 DIAGNOSIS — E1165 Type 2 diabetes mellitus with hyperglycemia: Secondary | ICD-10-CM

## 2023-12-20 DIAGNOSIS — E119 Type 2 diabetes mellitus without complications: Secondary | ICD-10-CM | POA: Insufficient documentation

## 2023-12-20 DIAGNOSIS — Z79899 Other long term (current) drug therapy: Secondary | ICD-10-CM | POA: Insufficient documentation

## 2023-12-20 DIAGNOSIS — I509 Heart failure, unspecified: Secondary | ICD-10-CM | POA: Diagnosis not present

## 2023-12-20 DIAGNOSIS — Z716 Tobacco abuse counseling: Secondary | ICD-10-CM | POA: Diagnosis not present

## 2023-12-20 DIAGNOSIS — I5022 Chronic systolic (congestive) heart failure: Secondary | ICD-10-CM | POA: Diagnosis not present

## 2023-12-20 DIAGNOSIS — I2510 Atherosclerotic heart disease of native coronary artery without angina pectoris: Secondary | ICD-10-CM | POA: Diagnosis not present

## 2023-12-20 DIAGNOSIS — F17200 Nicotine dependence, unspecified, uncomplicated: Secondary | ICD-10-CM | POA: Diagnosis not present

## 2023-12-20 DIAGNOSIS — I110 Hypertensive heart disease with heart failure: Secondary | ICD-10-CM | POA: Insufficient documentation

## 2023-12-20 DIAGNOSIS — I428 Other cardiomyopathies: Secondary | ICD-10-CM | POA: Diagnosis not present

## 2023-12-20 DIAGNOSIS — Z87891 Personal history of nicotine dependence: Secondary | ICD-10-CM | POA: Diagnosis not present

## 2023-12-20 MED ORDER — EMPAGLIFLOZIN 10 MG PO TABS
10.0000 mg | ORAL_TABLET | Freq: Every day | ORAL | 3 refills | Status: DC
Start: 2023-12-20 — End: 2024-04-17
  Filled 2024-01-17: qty 90, 90d supply, fill #0
  Filled 2024-02-11: qty 30, 30d supply, fill #0
  Filled 2024-02-28: qty 90, 90d supply, fill #0
  Filled 2024-03-04 – 2024-04-17 (×2): qty 30, 30d supply, fill #0

## 2023-12-20 MED ORDER — VARENICLINE TARTRATE 1 MG PO TABS
1.0000 mg | ORAL_TABLET | Freq: Two times a day (BID) | ORAL | 1 refills | Status: DC
  Filled 2023-12-20 – 2024-01-03 (×2): qty 180, 90d supply, fill #0

## 2023-12-20 MED ORDER — CARVEDILOL 12.5 MG PO TABS
12.5000 mg | ORAL_TABLET | Freq: Two times a day (BID) | ORAL | 11 refills | Status: DC
  Filled 2023-12-20: qty 60, 30d supply, fill #0
  Filled 2024-03-04: qty 60, 30d supply, fill #1

## 2023-12-20 NOTE — Patient Instructions (Addendum)
 INCREASE Carvedilol  to 12.5 mg Twice daily  Your physician recommends that you schedule a follow-up appointment as scheduled.  At the [facility], you and your health needs are our priority. As part of our continuing mission to provide you with exceptional heart care, we have created designated Provider Care Teams. These Care Teams include your primary Cardiologist (physician) and Advanced Practice Providers (APPs- Physician Assistants and Nurse Practitioners) who all work together to provide you with the care you need, when you need it.   You may see any of the following providers on your designated Care Team at your next follow up: Dr Toribio Fuel Dr Ezra Shuck Dr. Ria Commander Dr. Morene Brownie Amy Lenetta, NP Caffie Shed, [place] [facility], [place] Beckey Coe, NP Jordan Lee, NP Ellouise Class, FNP Jaun Bash, PharmD   Please be sure to bring in all your medications bottles to every appointment.    Thank you for choosing [facility]

## 2023-12-26 ENCOUNTER — Telehealth: Payer: Self-pay

## 2023-12-26 NOTE — Progress Notes (Signed)
 Care Guide Pharmacy Note  12/26/2023 Name: Jane Morrison MRN: [id number] DOB: 06/20/1961  Referred By: Gareth Mliss FALCON, FNP Reason for referral: Care Coordination (TNM Diabetes. )   Jane Morrison is a 63 y.o. year old female who is a primary care patient of Gareth Mliss FALCON, FNP.  Jane Morrison was referred to the pharmacist for assistance related to: DMII  An unsuccessful telephone outreach was attempted today to contact the patient who was referred to the pharmacy team for assistance with diabetes. Additional attempts will be made to contact the patient.  Jane Morrison Health/VBCI-Population Health [facility] Assistant [phone]

## 2024-01-01 ENCOUNTER — Telehealth: Payer: Self-pay

## 2024-01-01 NOTE — Progress Notes (Signed)
Care Guide Pharmacy Note  01/01/2024 Name: [name] MRN: [id number] DOB: 1961-06-25  Referred By: Berniece Salines, FNP Reason for referral: Care Coordination (TNM Diabetes. )   Jane Morrison is a 63 y.o. year old female who is a primary care patient of Berniece Salines, FNP.  [name] was referred to the pharmacist for assistance related to: DMII  A second unsuccessful telephone outreach was attempted today to contact the patient who was referred to the pharmacy team for assistance with diabetes. Additional attempts will be made to contact the patient.  [facility]  [facility], [facility] Health Care Management Assistant Direct Dial: [phone]  Fax: [phone]

## 2024-01-03 ENCOUNTER — Other Ambulatory Visit: Payer: Self-pay

## 2024-01-03 ENCOUNTER — Encounter: Payer: Self-pay | Admitting: Dietician

## 2024-01-03 ENCOUNTER — Encounter: Payer: 59 | Attending: Family | Admitting: Dietician

## 2024-01-03 VITALS — Ht 61.0 in | Wt 161.0 lb

## 2024-01-03 DIAGNOSIS — Z6830 Body mass index (BMI) 30.0-30.9, adult: Secondary | ICD-10-CM | POA: Insufficient documentation

## 2024-01-03 DIAGNOSIS — Z794 Long term (current) use of insulin: Secondary | ICD-10-CM

## 2024-01-03 DIAGNOSIS — Z713 Dietary counseling and surveillance: Secondary | ICD-10-CM | POA: Diagnosis not present

## 2024-01-03 DIAGNOSIS — E1165 Type 2 diabetes mellitus with hyperglycemia: Secondary | ICD-10-CM | POA: Diagnosis not present

## 2024-01-03 NOTE — Progress Notes (Signed)
Diabetes Self-Management Education  Visit Type: First/Initial  Appt. Start Time: 0950 Appt. End Time: 1125  01/03/2024  Jane Morrison, identified by name and date of birth, is a 63 y.o. female with a diagnosis of Diabetes: Type 2.   ASSESSMENT  Height 5\' 1"  (1.549 m), weight 161 lb (73 kg). Body mass index is 30.42 kg/m.   Diabetes Self-Management Education - 01/03/24 1006       Visit Information   Visit Type First/Initial      Initial Visit   Diabetes Type Type 2    Are you currently following a meal plan? No    Are you taking your medications as prescribed? Yes      Health Coping   How would you rate your overall health? Good      Psychosocial Assessment   Patient Belief/Attitude about Diabetes Motivated to manage diabetes    Self-care barriers None    Other persons present Patient    Learning Readiness Ready    How often do you need to have someone help you when you read instructions, pamphlets, or other written materials from your doctor or pharmacy? 1 - Never    What is the last grade level you completed in school? 16      Pre-Education Assessment   Patient understands the diabetes disease and treatment process. Needs Instruction    Patient understands incorporating nutritional management into lifestyle. Needs Instruction    Patient undertands incorporating physical activity into lifestyle. Needs Instruction    Patient understands using medications safely. Needs Review    Patient understands monitoring blood glucose, interpreting and using results Needs Instruction    Patient understands prevention, detection, and treatment of acute complications. Needs Instruction    Patient understands prevention, detection, and treatment of chronic complications. Needs Instruction    Patient understands how to develop strategies to address psychosocial issues. Needs Review    Patient understands how to develop strategies to promote health/change behavior. Needs Review       Complications   Last HgB A1C per patient/outside source 10.5 %    How often do you check your blood sugar? 0 times/day (not testing)   has accu chek meter and tests sporadically   Number of hypoglycemic episodes per month 0    Have you had a dilated eye exam in the past 12 months? No   2023   Have you had a dental exam in the past 12 months? Yes   12/2023, will be getting upper and lower plate   Are you checking your feet? Yes    How many days per week are you checking your feet? 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) -- rice bowl grilled chicken, lettuce, red cabbage, sour cream, cheese, pico, guacamole/ soft taco    Snack (afternoon) none or few bites of fries    Dinner sometimes takeout ie chicken wings; [employer] burger and salad; when home grilled meats ie steak/ chicken + potato/ grilled corn/ baked beans + steamed broccoli    Snack (evening) none    Beverage(s) coffee, sodas, water      Activity / Exercise   Activity / Exercise Type ADL's   works as Civil Service fast streamer     Patient Education   Previous Diabetes Education No    Disease Pathophysiology Definition of diabetes,  type 1 and 2, and the diagnosis of diabetes;Factors that contribute to the development of diabetes    Healthy Eating Role of diet in the treatment of diabetes and the relationship between the three main macronutrients and blood glucose level;Food label reading, portion sizes and measuring food.;Plate Method;Reviewed blood glucose goals for pre and post meals and how to evaluate the patients' food intake on their blood glucose level.;Meal timing in regards to the patients' current diabetes medication.;Meal options for control of blood glucose level and chronic complications.    Being Active Role of exercise on diabetes management, blood pressure control and cardiac health.    Monitoring  Purpose and frequency of SMBG.;Taught/discussed recording of test results and interpretation of SMBG.;Identified appropriate SMBG and/or A1C goals.    Acute complications Taught prevention, symptoms, and  treatment of hypoglycemia - the 15 rule.;Discussed and identified patients' prevention, symptoms, and treatment of hyperglycemia.    Chronic complications Relationship between chronic complications and blood glucose control;Lipid levels, blood glucose control and heart disease    Diabetes Stress and Support Role of stress on diabetes      Outcomes   Expected Outcomes Demonstrated interest in learning. Expect positive outcomes    Future DMSE 4-6 wks             Individualized Plan for Diabetes Self-Management Training:   Learning Objective:  Patient will have a greater understanding of diabetes self-management. Patient education plan is to attend individual and/or group sessions per assessed needs and concerns.   Plan:   Patient Instructions  Drink more water (OK to use sugar free flavoring) and less soda. Some diet soda is OK, or try Sparkling Ice or other flavored sparkling waters that are sugar free or low in sugar.  Check blood sugars daily, fasting and again about 2 hours after at least one meal each day.  Control portions of starchy foods like potatoes, baked beans, corn, pasta, breads. Keep to fist-size (1 cup) portion or less.  Try artisan sourdough bread in place of white bread, or at least 100% whole grain or sprouted grain bread.  Expected Outcomes:  Demonstrated interest in learning. Expect positive outcomes  Education material provided: How to Thrive book (ADA); Plate planner with food lists, General Nutrition Guidelines for Diabetes  If problems or questions, patient to contact team via:  Phone and patient portal  Future DSME appointment: 4-6 wks

## 2024-01-03 NOTE — Patient Instructions (Addendum)
Drink more water (OK to use sugar free flavoring) and less soda. Some diet soda is OK, or try Sparkling Ice or other flavored sparkling waters that are sugar free or low in sugar.  Check blood sugars daily, fasting and again about 2 hours after at least one meal each day.  Control portions of starchy foods like potatoes, baked beans, corn, pasta, breads. Keep to fist-size (1 cup) portion or less.  Try artisan sourdough bread in place of white bread, or at least 100% whole grain or sprouted grain bread.

## 2024-01-04 ENCOUNTER — Telehealth: Payer: Self-pay

## 2024-01-04 NOTE — Progress Notes (Signed)
Care Guide Pharmacy Note  01/04/2024 Name: TANIAH HEUERMANN MRN: [id number] DOB: [birth year]  Referred By: Berniece Salines, FNP Reason for referral: Care Coordination (TNM Diabetes. )   [name] is a 63 y.o. year old female who is a primary care patient of Berniece Salines, FNP.  Jane Morrison was referred to the pharmacist for assistance related to: DMII  A third unsuccessful telephone outreach was attempted today to contact the patient who was referred to the pharmacy team for assistance with diabetes. The Population Health team is pleased to engage with this patient at any time in the future upon receipt of referral and should he/she be interested in assistance from the [facility] Health team.  Baruch Gouty [facility] Health  [facility] Health Care Management Assistant Direct Dial: [phone]  Fax: [phone]

## 2024-01-06 ENCOUNTER — Other Ambulatory Visit: Payer: Self-pay | Admitting: Nurse Practitioner

## 2024-01-06 DIAGNOSIS — Z794 Long term (current) use of insulin: Secondary | ICD-10-CM

## 2024-01-08 NOTE — Telephone Encounter (Signed)
Requested Prescriptions  Pending Prescriptions Disp Refills   spironolactone (ALDACTONE) 25 MG tablet [Pharmacy Med Name: SPIRONOLACTONE 25 MG TABLET] 15 tablet 2    Sig: TAKE 1/2 TABLET BY MOUTH EVERY DAY     Cardiovascular: Diuretics - Aldosterone Antagonist Failed - 01/08/2024  1:13 PM      Failed - Last BP in normal range    BP Readings from Last 1 Encounters:  12/20/23 (!) 152/84         Passed - Cr in normal range and within 180 days    Creat  Date Value Ref Range Status  07/26/2023 1.18 (H) 0.50 - 1.03 mg/dL Final   Creatinine, Ser  Date Value Ref Range Status  11/08/2023 0.99 0.57 - 1.00 mg/dL Final   Creatinine, Urine  Date Value Ref Range Status  03/22/2023 105 20 - 275 mg/dL Final         Passed - K in normal range and within 180 days    Potassium  Date Value Ref Range Status  11/08/2023 5.0 3.5 - 5.2 mmol/L Final  11/05/2012 3.7 3.5 - 5.1 mmol/L Final         Passed - Na in normal range and within 180 days    Sodium  Date Value Ref Range Status  11/08/2023 139 134 - 144 mmol/L Final  11/05/2012 135 (L) 136 - 145 mmol/L Final         Passed - eGFR is 30 or above and within 180 days    EGFR (African American)  Date Value Ref Range Status  11/05/2012 >60  Final   GFR calc Af Amer  Date Value Ref Range Status  07/07/2017 >60 >60 mL/min Final    Comment:    (NOTE) The eGFR has been calculated using the CKD EPI equation. This calculation has not been validated in all clinical situations. eGFR's persistently <60 mL/min signify possible Chronic Kidney Disease.    EGFR (Non-African Amer.)  Date Value Ref Range Status  11/05/2012 >60  Final    Comment:    eGFR values <84mL/min/1.73 m2 may be an indication of chronic kidney disease (CKD). Calculated eGFR is useful in patients with stable renal function. The eGFR calculation will not be reliable in acutely ill patients when serum creatinine is changing rapidly. It is not useful in  patients on dialysis.  The eGFR calculation may not be applicable to patients at the low and high extremes of body sizes, pregnant women, and vegetarians.    GFR, Estimated  Date Value Ref Range Status  05/17/2023 56 (L) >60 mL/min Final    Comment:    (NOTE) Calculated using the CKD-EPI Creatinine Equation (2021)    eGFR  Date Value Ref Range Status  11/08/2023 65 >59 mL/min/1.73 Final         Passed - Valid encounter within last 6 months    Recent Outpatient Visits           2 months ago Viral upper respiratory tract infection   [facility] Berniece Salines, FNP   2 months ago Essential hypertension   [facility] Berniece Salines, FNP   5 months ago Annual physical exam   [facility] Berniece Salines, FNP   8 months ago Hospital discharge follow-up   [facility] Berniece Salines, FNP   9 months ago Tobacco dependence   [facility] Berniece Salines, [place]  Future Appointments             In 2 weeks Zane Herald, Rudolpho Sevin, FNP [facility]             metFORMIN (GLUCOPHAGE) 500 MG tablet [Pharmacy Med Name: METFORMIN HCL 500 MG TABLET] 180 tablet 0    Sig: TAKE 1 TABLET BY MOUTH TWICE A DAY WITH FOOD     Endocrinology:  Diabetes - Biguanides Failed - 01/08/2024  1:13 PM      Failed - HBA1C is between 0 and 7.9 and within 180 days    Hemoglobin A1C  Date Value Ref Range Status  10/25/2023 10.5 (A) 4.0 - 5.6 % Final   Hgb A1c MFr Bld  Date Value Ref Range Status  07/26/2023 10.8 (H) <5.7 % of total Hgb Final    Comment:    For someone without known diabetes, a hemoglobin A1c value of 6.5% or greater indicates that they may have  diabetes and this should be confirmed with a follow-up  test. . For someone with known diabetes, a value <7% indicates  that their diabetes is well controlled and a value  greater than or equal to  7% indicates suboptimal  control. A1c targets should be individualized based on  duration of diabetes, age, comorbid conditions, and  other considerations. . Currently, no consensus exists regarding use of hemoglobin A1c for diagnosis of diabetes for children. .          Failed - B12 Level in normal range and within 720 days    No results found for: "VITAMINB12"       Passed - Cr in normal range and within 360 days    Creat  Date Value Ref Range Status  07/26/2023 1.18 (H) 0.50 - 1.03 mg/dL Final   Creatinine, Ser  Date Value Ref Range Status  11/08/2023 0.99 0.57 - 1.00 mg/dL Final   Creatinine, Urine  Date Value Ref Range Status  03/22/2023 105 20 - 275 mg/dL Final         Passed - eGFR in normal range and within 360 days    EGFR (African American)  Date Value Ref Range Status  11/05/2012 >60  Final   GFR calc Af Amer  Date Value Ref Range Status  07/07/2017 >60 >60 mL/min Final    Comment:    (NOTE) The eGFR has been calculated using the CKD EPI equation. This calculation has not been validated in all clinical situations. eGFR's persistently <60 mL/min signify possible Chronic Kidney Disease.    EGFR (Non-African Amer.)  Date Value Ref Range Status  11/05/2012 >60  Final    Comment:    eGFR values <31mL/min/1.73 m2 may be an indication of chronic kidney disease (CKD). Calculated eGFR is useful in patients with stable renal function. The eGFR calculation will not be reliable in acutely ill patients when serum creatinine is changing rapidly. It is not useful in  patients on dialysis. The eGFR calculation may not be applicable to patients at the low and high extremes of body sizes, pregnant women, and vegetarians.    GFR, Estimated  Date Value Ref Range Status  05/17/2023 56 (L) >60 mL/min Final    Comment:    (NOTE) Calculated using the CKD-EPI Creatinine Equation (2021)    eGFR  Date Value Ref Range Status  11/08/2023 65 >59 mL/min/1.73 Final          Passed - Valid encounter within last 6 months  Recent Outpatient Visits           2 months ago Viral upper respiratory tract infection   [facility] Berniece Salines, FNP   2 months ago Essential hypertension   [facility] Berniece Salines, FNP   5 months ago Annual physical exam   [facility] Berniece Salines, FNP   8 months ago Hospital discharge follow-up   [facility] Berniece Salines, FNP   9 months ago Tobacco dependence   [facility] Berniece Salines, FNP       Future Appointments             In 2 weeks Berniece Salines, FNP [facility]            Passed - CBC within normal limits and completed in the last 12 months    WBC  Date Value Ref Range Status  07/26/2023 5.6 3.8 - 10.8 Thousand/uL Final   RBC  Date Value Ref Range Status  07/26/2023 4.33 3.80 - 5.10 Million/uL Final   Hemoglobin  Date Value Ref Range Status  07/26/2023 13.0 11.7 - 15.5 g/dL Final   HGB  Date Value Ref Range Status  11/05/2012 15.3 12.0 - 16.0 g/dL Final   HCT  Date Value Ref Range Status  07/26/2023 38.5 35.0 - 45.0 % Final  11/05/2012 43.2 35.0 - 47.0 % Final   MCHC  Date Value Ref Range Status  07/26/2023 33.8 32.0 - 36.0 g/dL Final   [facility]  Date Value Ref Range Status  07/26/2023 30.0 27.0 - 33.0 pg Final   MCV  Date Value Ref Range Status  07/26/2023 88.9 80.0 - 100.0 fL Final  11/05/2012 97 80 - 100 fL Final   No results found for: "PLTCOUNTKUC", "LABPLAT", "POCPLA" RDW  Date Value Ref Range Status  07/26/2023 13.6 11.0 - 15.0 % Final  11/05/2012 13.4 11.5 - 14.5 % Final

## 2024-01-08 NOTE — Telephone Encounter (Signed)
Requested medication (s) are due for refill today: routing for review  Requested medication (s) are on the active medication list: yes  Last refill:  07/26/23  Future visit scheduled: yes  Notes to clinic:  Unable to refill per protocol, last refill by another provider. Routing for approval from PCP.     Requested Prescriptions  Pending Prescriptions Disp Refills   spironolactone (ALDACTONE) 25 MG tablet [Pharmacy Med Name: SPIRONOLACTONE 25 MG TABLET] 15 tablet 2    Sig: TAKE 1/2 TABLET BY MOUTH EVERY DAY     Cardiovascular: Diuretics - Aldosterone Antagonist Failed - 01/08/2024  1:13 PM      Failed - Last BP in normal range    BP Readings from Last 1 Encounters:  12/20/23 (!) 152/84         Passed - Cr in normal range and within 180 days    Creat  Date Value Ref Range Status  07/26/2023 1.18 (H) 0.50 - 1.03 mg/dL Final   Creatinine, Ser  Date Value Ref Range Status  11/08/2023 0.99 0.57 - 1.00 mg/dL Final   Creatinine, Urine  Date Value Ref Range Status  03/22/2023 105 20 - 275 mg/dL Final         Passed - K in normal range and within 180 days    Potassium  Date Value Ref Range Status  11/08/2023 5.0 3.5 - 5.2 mmol/L Final  11/05/2012 3.7 3.5 - 5.1 mmol/L Final         Passed - Na in normal range and within 180 days    Sodium  Date Value Ref Range Status  11/08/2023 139 134 - 144 mmol/L Final  11/05/2012 135 (L) 136 - 145 mmol/L Final         Passed - eGFR is 30 or above and within 180 days    EGFR (African American)  Date Value Ref Range Status  11/05/2012 >60  Final   GFR calc Af Amer  Date Value Ref Range Status  07/07/2017 >60 >60 mL/min Final    Comment:    (NOTE) The eGFR has been calculated using the CKD EPI equation. This calculation has not been validated in all clinical situations. eGFR's persistently <60 mL/min signify possible Chronic Kidney Disease.    EGFR (Non-African Amer.)  Date Value Ref Range Status  11/05/2012 >60  Final     Comment:    eGFR values <57mL/min/1.73 m2 may be an indication of chronic kidney disease (CKD). Calculated eGFR is useful in patients with stable renal function. The eGFR calculation will not be reliable in acutely ill patients when serum creatinine is changing rapidly. It is not useful in  patients on dialysis. The eGFR calculation may not be applicable to patients at the low and high extremes of body sizes, pregnant women, and vegetarians.    GFR, Estimated  Date Value Ref Range Status  05/17/2023 56 (L) >60 mL/min Final    Comment:    (NOTE) Calculated using the CKD-EPI Creatinine Equation (2021)    eGFR  Date Value Ref Range Status  11/08/2023 65 >59 mL/min/1.73 Final         Passed - Valid encounter within last 6 months    Recent Outpatient Visits           2 months ago Viral upper respiratory tract infection   [facility] Berniece Salines, FNP   2 months ago Essential hypertension   [facility] Berniece Salines, [place]  5 months ago Annual physical exam   [facility] Berniece Salines, FNP   8 months ago Hospital discharge follow-up   [facility] Berniece Salines, FNP   9 months ago Tobacco dependence   [facility] Berniece Salines, FNP       Future Appointments             In 2 weeks Zane Herald, Rudolpho Sevin, FNP [facility]            Signed Prescriptions Disp Refills   metFORMIN (GLUCOPHAGE) 500 MG tablet 180 tablet 0    Sig: TAKE 1 TABLET BY MOUTH TWICE A DAY WITH FOOD     Endocrinology:  Diabetes - Biguanides Failed - 01/08/2024  1:13 PM      Failed - HBA1C is between 0 and 7.9 and within 180 days    Hemoglobin A1C  Date Value Ref Range Status  10/25/2023 10.5 (A) 4.0 - 5.6 % Final   Hgb A1c MFr Bld  Date Value Ref Range Status  07/26/2023 10.8 (H) <5.7 % of total Hgb Final    Comment:    For  someone without known diabetes, a hemoglobin A1c value of 6.5% or greater indicates that they may have  diabetes and this should be confirmed with a follow-up  test. . For someone with known diabetes, a value <7% indicates  that their diabetes is well controlled and a value  greater than or equal to 7% indicates suboptimal  control. A1c targets should be individualized based on  duration of diabetes, age, comorbid conditions, and  other considerations. . Currently, no consensus exists regarding use of hemoglobin A1c for diagnosis of diabetes for children. .          Failed - B12 Level in normal range and within 720 days    No results found for: "VITAMINB12"       Passed - Cr in normal range and within 360 days    Creat  Date Value Ref Range Status  07/26/2023 1.18 (H) 0.50 - 1.03 mg/dL Final   Creatinine, Ser  Date Value Ref Range Status  11/08/2023 0.99 0.57 - 1.00 mg/dL Final   Creatinine, Urine  Date Value Ref Range Status  03/22/2023 105 20 - 275 mg/dL Final         Passed - eGFR in normal range and within 360 days    EGFR (African American)  Date Value Ref Range Status  11/05/2012 >60  Final   GFR calc Af Amer  Date Value Ref Range Status  07/07/2017 >60 >60 mL/min Final    Comment:    (NOTE) The eGFR has been calculated using the CKD EPI equation. This calculation has not been validated in all clinical situations. eGFR's persistently <60 mL/min signify possible Chronic Kidney Disease.    EGFR (Non-African Amer.)  Date Value Ref Range Status  11/05/2012 >60  Final    Comment:    eGFR values <67mL/min/1.73 m2 may be an indication of chronic kidney disease (CKD). Calculated eGFR is useful in patients with stable renal function. The eGFR calculation will not be reliable in acutely ill patients when serum creatinine is changing rapidly. It is not useful in  patients on dialysis. The eGFR calculation may not be applicable to patients at the low and high  extremes of body sizes, pregnant women, and vegetarians.    GFR, Estimated  Date Value Ref Range Status  05/17/2023 56 (L) >60 mL/min Final    Comment:    (NOTE) Calculated using the CKD-EPI Creatinine Equation (2021)    eGFR  Date Value Ref Range Status  11/08/2023 65 >59 mL/min/1.73 Final         Passed - Valid encounter within last 6 months    Recent Outpatient Visits           2 months ago Viral upper respiratory tract infection   [facility] Berniece Salines, FNP   2 months ago Essential hypertension   [facility] Berniece Salines, FNP   5 months ago Annual physical exam   [facility] Berniece Salines, FNP   8 months ago Hospital discharge follow-up   [facility] Berniece Salines, FNP   9 months ago Tobacco dependence   [facility] Berniece Salines, FNP       Future Appointments             In 2 weeks Berniece Salines, FNP [facility]            Passed - CBC within normal limits and completed in the last 12 months    WBC  Date Value Ref Range Status  07/26/2023 5.6 3.8 - 10.8 Thousand/uL Final   RBC  Date Value Ref Range Status  07/26/2023 4.33 3.80 - 5.10 Million/uL Final   Hemoglobin  Date Value Ref Range Status  07/26/2023 13.0 11.7 - 15.5 g/dL Final   HGB  Date Value Ref Range Status  11/05/2012 15.3 12.0 - 16.0 g/dL Final   HCT  Date Value Ref Range Status  07/26/2023 38.5 35.0 - 45.0 % Final  11/05/2012 43.2 35.0 - 47.0 % Final   MCHC  Date Value Ref Range Status  07/26/2023 33.8 32.0 - 36.0 g/dL Final   [facility]  Date Value Ref Range Status  07/26/2023 30.0 27.0 - 33.0 pg Final   MCV  Date Value Ref Range Status  07/26/2023 88.9 80.0 - 100.0 fL Final  11/05/2012 97 80 - 100 fL Final   No results found for: "PLTCOUNTKUC", "LABPLAT", "POCPLA" RDW  Date Value Ref Range Status   07/26/2023 13.6 11.0 - 15.0 % Final  11/05/2012 13.4 11.5 - 14.5 % Final

## 2024-01-10 ENCOUNTER — Encounter: Payer: Self-pay | Admitting: Podiatry

## 2024-01-10 ENCOUNTER — Telehealth: Payer: Self-pay | Admitting: Pharmacist

## 2024-01-10 ENCOUNTER — Ambulatory Visit (INDEPENDENT_AMBULATORY_CARE_PROVIDER_SITE_OTHER): Payer: 59 | Admitting: Podiatry

## 2024-01-10 DIAGNOSIS — E1142 Type 2 diabetes mellitus with diabetic polyneuropathy: Secondary | ICD-10-CM

## 2024-01-10 NOTE — Progress Notes (Signed)
She presents today for her 73-month checkup for diabetic foot.  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  She is not complaining of any foot problem other than some dry skin does relate that her hemoglobin A1c recently taken has been at 10.4.  She does go on to say that she has pain in her carpometacarpal joints of her thumb and that that seems to be hurting worse than anything.  Objective: Vital signs are stable she is alert and oriented x 3.  Pulses are palpable.  She has neurologic and Sorum which is intact per Semmes Weinstein monofilament vibratory sensation and position with proprioceptive sensation.  No open lesions or wounds nails are of normal length color skin is normal texture and color though she does have some patchy areas of dryness.  Muscle strength is normal and symmetrical bilateral.  Assessment: Diabetes mellitus noncomplicated.  Carpometacarpal joint arthritis bilateral hands.  Plan: Discussed etiology pathology and surgical therapies were going to make referral to orthopedics bilateral and follow-up with her in 6 months for diabetic check.  Questions or concerns or any complications she will notify us immediately.

## 2024-01-10 NOTE — Progress Notes (Signed)
   01/10/2024  Patient ID: Jane Morrison, female   DOB: 12-22-1960, 63 y.o.   MRN: [id number]  Patient appearing on report for [employer] - Diabetes Control report due to last documented A1C of 10.5% on 10/25/2023. Next appointment with PCP is 01/24/2024.    Outreached patient to discuss diabetes control and medication management.   Was unable to reach patient via telephone today and have left HIPAA compliant voicemail asking patient to return my call.   Estelle Grumbles, PharmD, [facility] [phone]

## 2024-01-10 NOTE — Progress Notes (Signed)
Receive a call back from patient. Appointment scheduled   Follow Up Plan: Clinical Pharmacist will outreach to patient by telephone on 01/17/2024 at 12:00 PM   Estelle Grumbles, PharmD, [facility] [phone]

## 2024-01-16 ENCOUNTER — Telehealth: Payer: Self-pay | Admitting: Family

## 2024-01-16 NOTE — Telephone Encounter (Signed)
Lvm to confirm appt for 01/17/24

## 2024-01-17 ENCOUNTER — Encounter: Payer: Self-pay | Admitting: Pharmacist

## 2024-01-17 ENCOUNTER — Telehealth: Payer: Self-pay

## 2024-01-17 ENCOUNTER — Other Ambulatory Visit: Payer: Self-pay | Admitting: Pharmacist

## 2024-01-17 ENCOUNTER — Other Ambulatory Visit: Payer: Self-pay

## 2024-01-17 ENCOUNTER — Other Ambulatory Visit: Payer: Self-pay | Admitting: Nurse Practitioner

## 2024-01-17 ENCOUNTER — Ambulatory Visit: Payer: 59 | Attending: Family | Admitting: Family

## 2024-01-17 ENCOUNTER — Encounter: Payer: Self-pay | Admitting: Family

## 2024-01-17 VITALS — BP 135/76 | HR 66 | Wt 162.0 lb

## 2024-01-17 DIAGNOSIS — I2510 Atherosclerotic heart disease of native coronary artery without angina pectoris: Secondary | ICD-10-CM | POA: Diagnosis not present

## 2024-01-17 DIAGNOSIS — Z7985 Long-term (current) use of injectable non-insulin antidiabetic drugs: Secondary | ICD-10-CM | POA: Diagnosis not present

## 2024-01-17 DIAGNOSIS — Z79899 Other long term (current) drug therapy: Secondary | ICD-10-CM | POA: Diagnosis present

## 2024-01-17 DIAGNOSIS — Z87891 Personal history of nicotine dependence: Secondary | ICD-10-CM | POA: Diagnosis not present

## 2024-01-17 DIAGNOSIS — I5022 Chronic systolic (congestive) heart failure: Secondary | ICD-10-CM | POA: Insufficient documentation

## 2024-01-17 DIAGNOSIS — E119 Type 2 diabetes mellitus without complications: Secondary | ICD-10-CM | POA: Insufficient documentation

## 2024-01-17 DIAGNOSIS — Z7984 Long term (current) use of oral hypoglycemic drugs: Secondary | ICD-10-CM | POA: Insufficient documentation

## 2024-01-17 DIAGNOSIS — I10 Essential (primary) hypertension: Secondary | ICD-10-CM

## 2024-01-17 DIAGNOSIS — I110 Hypertensive heart disease with heart failure: Secondary | ICD-10-CM | POA: Diagnosis not present

## 2024-01-17 DIAGNOSIS — Z794 Long term (current) use of insulin: Secondary | ICD-10-CM

## 2024-01-17 DIAGNOSIS — I428 Other cardiomyopathies: Secondary | ICD-10-CM | POA: Insufficient documentation

## 2024-01-17 DIAGNOSIS — E1165 Type 2 diabetes mellitus with hyperglycemia: Secondary | ICD-10-CM

## 2024-01-17 DIAGNOSIS — Z566 Other physical and mental strain related to work: Secondary | ICD-10-CM | POA: Insufficient documentation

## 2024-01-17 MED ORDER — ONETOUCH VERIO FLEX SYSTEM W/DEVICE KIT
PACK | 0 refills | Status: AC
Start: 2024-01-17 — End: ?

## 2024-01-17 MED ORDER — TRULICITY 0.75 MG/0.5ML ~~LOC~~ SOAJ: 0.7500 mg | SUBCUTANEOUS | 0 refills | Status: DC

## 2024-01-17 MED ORDER — ONETOUCH VERIO VI STRP
ORAL_STRIP | 3 refills | Status: AC
Start: 2024-01-17 — End: ?

## 2024-01-17 MED ORDER — ENTRESTO 97-103 MG PO TABS
1.0000 | ORAL_TABLET | Freq: Two times a day (BID) | ORAL | 11 refills | Status: DC
  Filled 2024-01-17: qty 60, 30d supply, fill #0
  Filled 2024-02-28: qty 60, 30d supply, fill #1
  Filled 2024-04-17: qty 60, 30d supply, fill #2

## 2024-01-17 MED ORDER — ONETOUCH DELICA PLUS LANCET30G MISC
3 refills | Status: AC
Start: 2024-01-17 — End: ?

## 2024-01-17 NOTE — Progress Notes (Signed)
 Advanced Heart Failure Clinic Note    PCP: Gareth Mliss FALCON, FNP (last seen 11/24) Cardiologist: Ammon Blunt, MD (last seen during 04/24 admission)  Chief Complaint: fatigue  HPI:  Jane Morrison is a 63 y/o female with a history of DM, hyperlipidemia, HTN, anxiety, depression, CAD, tobacco use & chronic heart failure (newly diagnosed)  Was in the ED 02/20/23 due to acute cough. Admitted 03/30/23 due to acute onset of worsening dyspnea, associated orthopnea, chest pressure and cough productive of clear sputum as well as wheezing. Diuresed with IV lasix . NTG drip started and cardiology consulted. Initially needed bipap due to hypoxia but able to be weaned off to room air. Cath done per above. Was in the ED 04/27/23 after a mechanical fall after she tripped while at work at [employer]. Hit her head on the ground but no LOC. Negative imaging.   Echo 03/30/23: EF 25-30% with mild/ moderate LVH consistent with Grade I DD and mild MR.   RHC/LHC 04/03/23:   Prox RCA to Mid RCA lesion is 70% stenosed.   Dist RCA lesion is 50% stenosed.   Ost LAD to Prox LAD lesion is 30% stenosed.   There is moderate left ventricular systolic dysfunction.   The left ventricular ejection fraction is 35-45% by visual estimate.  1.  Normal right sided heart pressures (PA 25/7 (13), PCWP 6/4) 2.  Mild to moderate coronary artery disease with 70% stenosis mid RCA 3.  Moderately reduced left ventricular function with estimated LV ejection fraction 35-45%  She presents today for a HF follow-up visit with a chief complaint of minimal fatigue. 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 Does have trouble sleeping due to work stress & sometimes long hours. Denies shortness of breath, chest pain, cough, palpitations, abdominal distention, pedal edema, dizziness or weight gain.    Hasn't smoked in ~ 1 year (April 2024) and rarely takes chantix   anymore.   She voices frustration regarding her A1c not improving. She is taking the diabetes nutrition classes and no longer drinks pepsi and has changed her diet but her A1c continues to be >10%.  At last visit her carvedilol  was increased and she denies having any issues with it.     ROS: All systems negative except as listed in HPI, PMH and Problem List.  SH:  Social History   Socioeconomic History   Marital status: Married    Spouse name: Not on file   Number of children: 3   Years of education: Not on file   Highest education level: Associate degree: academic program  Occupational History   Not on file  Tobacco Use   Smoking status: Former    Current packs/day: 0.00    Average packs/day: 0.5 packs/day for 40.0 years (20.0 ttl pk-yrs)    Types: Cigarettes    Start date: 03/29/1983    Quit date: 03/29/2023    Years since quitting: 0.8    Passive exposure: Past   Smokeless tobacco: Never  Vaping Use   Vaping status: Never Used  Substance and Sexual Activity   Alcohol use: No   Drug use: No   Sexual activity: Yes  Other Topics Concern   Not on file  Social History Narrative   3 biological kids 4 stepchildren   Social Drivers of Health   Financial Resource Strain: Low Risk  (10/23/2023)   Overall Financial Resource Strain (CARDIA)  Difficulty of Paying Living Expenses: Not hard at all  Food Insecurity: No Food Insecurity (10/23/2023)   Hunger Vital Sign    Worried About Running Out of Food in the Last Year: Never true    Ran Out of Food in the Last Year: Never true  Transportation Needs: No Transportation Needs (10/23/2023)   PRAPARE - Administrator, Civil Service (Medical): No    Lack of Transportation (Non-Medical): No  Physical Activity: Sufficiently Active (10/23/2023)   Exercise Vital Sign    Days of Exercise per Week: 6 days    Minutes of Exercise per Session: 90 min  Recent Concern: Physical Activity - Inactive (07/26/2023)   Exercise Vital  Sign    Days of Exercise per Week: 0 days    Minutes of Exercise per Session: 0 min  Stress: No Stress Concern Present (10/23/2023)   [employer] of Occupational Health - Occupational Stress Questionnaire    Feeling of Stress : Not at all  Social Connections: Socially Integrated (10/23/2023)   Social Connection and Isolation Panel [NHANES]    Frequency of Communication with Friends and Family: More than three times a week    Frequency of Social Gatherings with Friends and Family: Twice a week    Attends Religious Services: More than 4 times per year    Active Member of [employer] or Organizations: Yes    Attends Banker Meetings: 1 to 4 times per year    Marital Status: Married  Catering Manager Violence: Not At Risk (07/26/2023)   Humiliation, Afraid, Rape, and Kick questionnaire    Fear of Current or Ex-Partner: No    Emotionally Abused: No    Physically Abused: No    Sexually Abused: No    FH:  Family History  Problem Relation Age of Onset   Hypertension Mother    Diabetes Mother    Hypertension Father    Diabetes Father    Breast cancer Sister 38    Past Medical History:  Diagnosis Date   Anxiety    CHF (congestive heart failure) (HCC)    Depression    Diabetes mellitus without complication (HCC)    Hyperlipidemia    Hypertension     Current Outpatient Medications  Medication Sig Dispense Refill   Accu-Chek Softclix Lancets lancets Use as instructed 100 each 12   aspirin  81 MG chewable tablet Chew 1 tablet (81 mg total) by mouth daily. 90 tablet 0   blood glucose meter kit and supplies KIT Dispense based on patient and insurance preference. Use up to four times daily as directed. (FOR ICD-9 250.00, 250.01). 1 each 0   carvedilol  (COREG ) 12.5 MG tablet Take 1 tablet (12.5 mg total) by mouth 2 (two) times daily. 60 tablet 11   Dulaglutide  (TRULICITY ) 0.75 MG/0.5ML SOAJ Inject 0.75 mg into the skin once a week. 2 mL 0   empagliflozin  (JARDIANCE ) 10 MG  TABS tablet Take 1 tablet (10 mg total) by mouth daily before breakfast. 90 tablet 3   Glucagon  (GVOKE HYPOPEN  1-PACK) 0.5 MG/0.1ML SOAJ Inject 0.5 mg into the skin once as needed for up to 1 dose (for hypoglycemia). 0.1 mL 3   Glucose Blood (BLOOD GLUCOSE TEST STRIPS) STRP Use as directed to monitor FSBS once daily for DM 100 strip 3   insulin  degludec (TRESIBA  FLEXTOUCH) 100 UNIT/ML FlexTouch Pen Inject 10 Units into the skin daily. (Patient taking differently: Inject 12 Units into the skin daily.) 3 mL 3   Insulin  Pen  Needle (BD PEN NEEDLE MICRO U/F) 32G X 6 MM MISC Use to administer insulin  100 each 5   meloxicam  (MOBIC ) 15 MG tablet Take 1 tablet (15 mg total) by mouth daily. 30 tablet 0   metFORMIN  (GLUCOPHAGE ) 500 MG tablet TAKE 1 TABLET BY MOUTH TWICE A DAY WITH FOOD 180 tablet 0   promethazine -dextromethorphan (PROMETHAZINE -DM) 6.25-15 MG/5ML syrup Take 5 mLs by mouth 4 (four) times daily as needed for cough. 118 mL 0   sacubitril -valsartan  (ENTRESTO ) 49-51 MG Take 1 tablet by mouth 2 (two) times daily. 60 tablet 5   spironolactone  (ALDACTONE ) 25 MG tablet TAKE 1/2 TABLET BY MOUTH EVERY DAY 15 tablet 2   varenicline  (CHANTIX ) 1 MG tablet Take 1 tablet (1 mg total) by mouth 2 (two) times daily. 180 tablet 1   No current facility-administered medications for this visit.   Vitals:   01/17/24 0853  BP: 135/76  Pulse: 66  SpO2: 98%  Weight: 162 lb (73.5 kg)   Wt Readings from Last 3 Encounters:  01/17/24 162 lb (73.5 kg)  01/03/24 161 lb (73 kg)  12/20/23 162 lb (73.5 kg)   Lab Results  Component Value Date   CREATININE 0.99 11/08/2023   CREATININE 1.18 (H) 07/26/2023   CREATININE 1.13 (H) 05/17/2023    PHYSICAL EXAM:  General: Well appearing. No resp difficulty HEENT: normal Neck: supple, no JVD Cor: Regular rhythm, rate. No rubs, gallops or murmurs Lungs: clear Abdomin: soft, nontender, nondistended. Extremities: no cyanosis, clubbing, rash, edema Neuro: alert &  oriented X 3. Moves all 4 extremities w/o difficulty. Affect pleasant   ECG: not done   ASSESSMENT & PLAN:  1: NICM with reduced ejection fraction- - etiology likely HTN/ DM - NYHA Morrison II - euvolemic - weighing daily; reminded to call for an overnight weight gain of > 2 pounds or a weekly weight gain of >5 pounds - weight unchanged from last visit here 1 month ago - Echo 03/30/23: EF 25-30% with mild/ moderate LVH consistent with Grade I DD and mild MR.  - will get echo updated once GDMT is optimized - continue jardiance  10mg  daily  - increase entresto  to 97/103mg  BID - continue carvedilol  12.5mg  BID - continue spironolactone  25mg  daily - BMET today - BNP 03/30/23 was 471.0  2: HTN- - BP improved 135/76 - had video visit with PCP Jane Morrison) 11/24 - BMP 11/08/23 reviewed and showed sodium 139, potassium 5.0, creatinine 0.99 & GFR 65 - BMET today  3: CAD- - saw cardiology (Jane Morrison) during admission 04/24; f/u appt had been scheduled for 06/14/23 but she says that his office cancelled it and it hasn't been r/s yet; will make referral back to him at next OV - LDL 03/31/23 reviewed and was 93 - RHC/LHC 04/03/23:   Prox RCA to Mid RCA lesion is 70% stenosed.   Dist RCA lesion is 50% stenosed.   Ost LAD to Prox LAD lesion is 30% stenosed.   There is moderate left ventricular systolic dysfunction.   The left ventricular ejection fraction is 35-45% by visual estimate.  1.  Normal right sided heart pressures (PA 25/7 (13), PCWP 6/4) 2.  Mild to moderate coronary artery disease with 70% stenosis mid RCA 3.  Moderately reduced left ventricular function with estimated LV ejection fraction 35-45%  4: DM (managed by PCP)- - A1c 10/25/23 was 10.5% - she voices frustration about her inability to get her A1c down - continue metformin  500mg  BID; may benefit from increasing this to 1000mg  BID  but will defer to PCP - continue trulicity  0.75mg / week  Return in 1 month, sooner if needed.    Jane DELENA Class, FNP 01/17/24

## 2024-01-17 NOTE — Progress Notes (Signed)
 01/17/2024 Name: Jane Morrison MRN: [id number] DOB: [birth year]  Chief Complaint  Patient presents with   Medication Management    Jane Morrison is a 63 y.o. year old female who presented for a telephone visit.   Patient appearing on report for [employer] - Diabetes Control report due to last documented A1C of 10.5% on 10/25/2023. Next appointment with PCP is 01/24/2024.    Outreached patient to discuss diabetes control and medication management.   Unable to complete comprehensive medication review today as patient is not home during our call  Subjective:  Care Team: Primary Care Provider: Gareth Mliss FALCON, FNP ; Next Scheduled Visit: 01/24/2024 Cardiologist: Donette Ellouise LABOR, FNP; Next Scheduled Visit: 02/14/2024 Dietitian: Gail Sharlet KIDD, RD; Next Schedule Visit: 02/14/2024  Medication Access/Adherence  Current Pharmacy:  [id number] [place] [place] - [address] [place] [address] Phone: [phone] Fax: [phone]  [facility] [address] [place] [address] Phone: [phone] Fax: [phone]   Patient reports affordability concerns with their medications: Yes  Patient reports access/transportation concerns to their pharmacy: No  Patient reports adherence concerns with their medications:  No     Diabetes:  Current medications:  - Jardiance  10 mg daily  - Tresiba  12 units daily  - metformin  500 mg twice daily with food - Trulicity  0.75 mg weekly (started by PCP on 11/14/2023 from sample), but stopped in January when ran out and [facility] advised that they had not received a prescription  Denies monitoring home blood sugar recently; reports old monitor is not working  Patient denies hypoglycemic s/sx including dizziness, shakiness, sweating.   Patient working with [facility] Registered Dietitian  - Reports working on making significant dietary changes since started working with  RD  Current physical activity: reports stays active throughout the day at work    Objective:  Lab Results  Component Value Date   HGBA1C 10.5 (A) 10/25/2023    Lab Results  Component Value Date   CREATININE 0.99 11/08/2023   BUN 11 11/08/2023   NA 139 11/08/2023   K 5.0 11/08/2023   CL 104 11/08/2023   CO2 22 11/08/2023    Lab Results  Component Value Date   CHOL 159 03/31/2023   HDL 41 03/31/2023   LDLCALC 93 03/31/2023   TRIG 124 03/31/2023   CHOLHDL 3.9 03/31/2023    Current Outpatient Medications on File Prior to Visit  Medication Sig Dispense Refill   metFORMIN  (GLUCOPHAGE ) 500 MG tablet TAKE 1 TABLET BY MOUTH TWICE A DAY WITH FOOD 180 tablet 0   aspirin  81 MG chewable tablet Chew 1 tablet (81 mg total) by mouth daily. 90 tablet 0   blood glucose meter kit and supplies KIT Dispense based on patient and insurance preference. Use up to four times daily as directed. (FOR ICD-9 250.00, 250.01). 1 each 0   carvedilol  (COREG ) 12.5 MG tablet Take 1 tablet (12.5 mg total) by mouth 2 (two) times daily. 60 tablet 11   empagliflozin  (JARDIANCE ) 10 MG TABS tablet Take 1 tablet (10 mg total) by mouth daily before breakfast. 90 tablet 3   Glucagon  (GVOKE HYPOPEN  1-PACK) 0.5 MG/0.1ML SOAJ Inject 0.5 mg into the skin once as needed for up to 1 dose (for hypoglycemia). 0.1 mL 3   insulin  degludec (TRESIBA  FLEXTOUCH) 100 UNIT/ML FlexTouch Pen Inject 10 Units into the skin daily. (Patient taking differently: Inject 12 Units into the skin daily.)  3 mL 3   Insulin  Pen Needle (BD PEN NEEDLE MICRO U/F) 32G X 6 MM MISC Use to administer insulin  100 each 5   meloxicam  (MOBIC ) 15 MG tablet Take 1 tablet (15 mg total) by mouth daily. 30 tablet 0   promethazine -dextromethorphan (PROMETHAZINE -DM) 6.25-15 MG/5ML syrup Take 5 mLs by mouth 4 (four) times daily as needed for cough. 118 mL 0   sacubitril -valsartan  (ENTRESTO ) 97-103 MG Take 1 tablet by mouth 2 (two) times daily. 60 tablet 11    spironolactone  (ALDACTONE ) 25 MG tablet TAKE 1/2 TABLET BY MOUTH EVERY DAY 15 tablet 2   No current facility-administered medications on file prior to visit.       Assessment/Plan:   Unable to complete comprehensive medication review today as patient is not home during our call  Diabetes: - Currently uncontrolled - Reviewed long term cardiovascular and renal outcomes of uncontrolled blood sugar - Encourage patient to continue to work with dietitian on making positive dietary changes  - [facility]. [facility] who advises pharmacy did not receive Trulicity  prescription from December  From review of this prescription record in chart, note transmission status for this prescription is Transmission to pharmacy failed Collaborate with PCP and provider resends prescription to pharmacy today - Note [facility] advises that patient's prescriptions are being processed through [facility] coverage. However, patient states that [place]ul[place] is a financial planner and that claims should go through her employer coverage Pharmacist, [facility])  Locate scan of employer plan pharmacy coverage ([employer]) in chart - Collaborate with [facility] pharmacy to run test claim for Trulicity  for patient. Find that Trulicity  requires a prior authorization. However, this coverage is not compatible with covermymeds Contact [employer] [phone]). Representative Debbie advises PA request must be completed via fax and will fax this form to our office now Follow up with PCP/office and patient to provide update - Send prescriptions for blood sugar monitor and testing supplies to [facility] for patient today - Recommend to check glucose, keep log of results and have this record to review at upcoming medical appointments. Patient to contact provider office sooner if needed for readings outside of established parameters or symptoms - Counsel patient on s/s of low blood sugar  and how to treat lows Review rules of 15s - importance of using 15 grams of sugar to treat low, recheck blood sugar in 15 minutes, treat again if remains low or, if back to normal, having meal if mealtime or snack  Encourage patient to pick up glucose tablets to carry with her in case of low blood sugar   Follow Up Plan: Clinical Pharmacist will follow up with patient by telephone within the next 7 days  Sharyle Sia, PharmD, [facility] [phone]

## 2024-01-17 NOTE — Patient Instructions (Addendum)
 INCREASE YOUR ENTRESTO  TO 97-103 MG TWICE DAILY   Go DOWN to LOWER LEVEL (LL) to have your blood work completed inside of [employer] office.  We will only call you if the results are abnormal or if the provider would like to make medication changes.

## 2024-01-17 NOTE — Telephone Encounter (Signed)
 please call patient and verify what diabetic medication she is taking. ask her what her blood sugar is running. let her know that I placed a referral to the pharmacist to help get better control of her diabetes.   Called left detailed vm and to call back w/ details

## 2024-01-17 NOTE — Patient Instructions (Signed)
 Goals Addressed             This Visit's Progress    Pharmacy Goals       Our office will work on completing the prior authorization (PA) for your Trulicity . We will let you know as soon as we receive a response back from your insurance.   The following is the how to use video for the One Touch Verio blood sugar meter:   [email or web address]   Please let me or your pharmacy know if you have any questions about using this device!   The goal A1c is less than 7%. This is the best way to reduce the risk of the long term complications of diabetes, including heart disease, kidney disease, eye disease, strokes, and nerve damage. An A1c of less than 7% corresponds with fasting sugars less than 130 and 2 hour after meal sugars less than 180. Please check your blood sugar at home and keep a log of the results. Please bring this record with you to your upcoming appointment with FNP Mliss Spray on 01/24/2024.   Thank you!   Sharyle Sia, PharmD, [facility] [phone]

## 2024-01-18 ENCOUNTER — Telehealth: Payer: Self-pay

## 2024-01-18 ENCOUNTER — Encounter: Payer: Self-pay | Admitting: Family

## 2024-01-18 ENCOUNTER — Other Ambulatory Visit: Payer: Self-pay

## 2024-01-18 DIAGNOSIS — M19049 Primary osteoarthritis, unspecified hand: Secondary | ICD-10-CM

## 2024-01-18 LAB — BASIC METABOLIC PANEL
BUN/Creatinine Ratio: 12 (ref 12–28)
BUN: 14 mg/dL (ref 8–27)
CO2: 23 mmol/L (ref 20–29)
Calcium: 9.6 mg/dL (ref 8.7–10.3)
Chloride: 101 mmol/L (ref 96–106)
Creatinine, Ser: 1.16 mg/dL — ABNORMAL HIGH (ref 0.57–1.00)
Glucose: 249 mg/dL — ABNORMAL HIGH (ref 70–99)
Potassium: 5 mmol/L (ref 3.5–5.2)
Sodium: 138 mmol/L (ref 134–144)
eGFR: 54 mL/min/{1.73_m2} — ABNORMAL LOW (ref >59)

## 2024-01-18 NOTE — Telephone Encounter (Signed)
 Referral, office note and demographics faxed to [facility] on [address] in [place] Confirmation received

## 2024-01-18 NOTE — Telephone Encounter (Signed)
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

## 2024-01-19 ENCOUNTER — Other Ambulatory Visit (HOSPITAL_COMMUNITY): Payer: Self-pay

## 2024-01-22 ENCOUNTER — Other Ambulatory Visit (HOSPITAL_COMMUNITY): Payer: Self-pay

## 2024-01-23 ENCOUNTER — Other Ambulatory Visit: Payer: Self-pay

## 2024-01-23 ENCOUNTER — Other Ambulatory Visit (HOSPITAL_COMMUNITY): Payer: Self-pay

## 2024-01-24 ENCOUNTER — Encounter: Payer: Self-pay | Admitting: Nurse Practitioner

## 2024-01-24 ENCOUNTER — Ambulatory Visit (INDEPENDENT_AMBULATORY_CARE_PROVIDER_SITE_OTHER): Payer: 59 | Admitting: Nurse Practitioner

## 2024-01-24 ENCOUNTER — Other Ambulatory Visit: Payer: Self-pay | Admitting: Pharmacist

## 2024-01-24 VITALS — BP 122/78 | HR 78 | Temp 97.6°F | Resp 18 | Ht 61.0 in | Wt 160.3 lb

## 2024-01-24 DIAGNOSIS — I10 Essential (primary) hypertension: Secondary | ICD-10-CM

## 2024-01-24 DIAGNOSIS — E1165 Type 2 diabetes mellitus with hyperglycemia: Secondary | ICD-10-CM

## 2024-01-24 DIAGNOSIS — I5022 Chronic systolic (congestive) heart failure: Secondary | ICD-10-CM

## 2024-01-24 DIAGNOSIS — Z794 Long term (current) use of insulin: Secondary | ICD-10-CM | POA: Diagnosis not present

## 2024-01-24 DIAGNOSIS — Z23 Encounter for immunization: Secondary | ICD-10-CM | POA: Diagnosis not present

## 2024-01-24 LAB — POCT GLYCOSYLATED HEMOGLOBIN (HGB A1C): Hemoglobin A1C: 10.8 % — AB (ref 4.0–5.6)

## 2024-01-24 NOTE — Progress Notes (Signed)
BP 122/78   Pulse 78   Temp 97.6 F (36.4 C)   Resp 18   Ht 5\' 1"  (1.549 m)   Wt 160 lb 4.8 oz (72.7 kg)   SpO2 98%   BMI 30.29 kg/m    Subjective:    Patient ID: Jane Morrison, female    DOB: 12/20/1960, 63 y.o.   MRN: [id number]  HPI: Jane Morrison is a 63 y.o. female who returns to the clinic for a 3 month follow-up appointment. Pt reports that she has not been checking her blood glucose at home because she does not know how to work the glucometer. A1c remains elevated at 10.8. Glucometer Training provided at today's visit. Have been working with pharmacy to get approved for trulicity. She was approved today and is going to go pick up the prescription now. She is up to date on foot exam,  eye exam and microalbumin urine.   Heart failure: managed by cardiology and heart failure clinic.  Taking medications as prescribed. Patient reports she is doing well.    Chief Complaint  Patient presents with   Medical Management of Chronic Issues   Diabetes             01/03/2024    9:57 AM 10/31/2023    7:48 AM 10/25/2023   10:16 AM  Depression screen PHQ 2/9  Decreased Interest 0 0 0  Down, Depressed, Hopeless 0 0 0  PHQ - 2 Score 0 0 0    Relevant past medical, surgical, family and social history reviewed and updated as indicated. Interim medical history since our last visit reviewed. Allergies and medications reviewed and updated.  Review of Systems Constitutional: Negative for fever or weight change.  Respiratory: Negative for cough and shortness of breath.   Cardiovascular: Negative for chest pain or palpitations.  Gastrointestinal: Negative for abdominal pain, no bowel changes.  Musculoskeletal: Negative for gait problem or joint swelling.  Skin: Negative for rash.  Neurological: Negative for dizziness or headache.  No other specific complaints in a complete review of systems (except as listed in HPI above).  Per HPI unless specifically indicated above      Objective:    BP 122/78   Pulse 78   Temp 97.6 F (36.4 C)   Resp 18   Ht 5\' 1"  (1.549 m)   Wt 160 lb 4.8 oz (72.7 kg)   SpO2 98%   BMI 30.29 kg/m   BP Readings from Last 3 Encounters:  01/24/24 122/78  01/17/24 135/76  12/20/23 (!) 152/84     Wt Readings from Last 3 Encounters:  01/24/24 160 lb 4.8 oz (72.7 kg)  01/17/24 162 lb (73.5 kg)  01/03/24 161 lb (73 kg)    Physical Exam Vitals reviewed.  Constitutional:      Appearance: Normal appearance.  HENT:     Head: Normocephalic.  Cardiovascular:     Rate and Rhythm: Normal rate and regular rhythm.  Pulmonary:     Effort: Pulmonary effort is normal.     Breath sounds: Normal breath sounds.  Musculoskeletal:        General: Normal range of motion.  Skin:    General: Skin is warm and dry.  Neurological:     General: No focal deficit present.     Mental Status: She is alert and oriented to person, place, and time. Mental status is at baseline.  Psychiatric:        Mood and Affect: Mood normal.  Behavior: Behavior normal.        Thought Content: Thought content normal.        Judgment: Judgment normal.     Results for orders placed or performed in visit on 01/24/24  POCT HgB A1C   Collection Time: 01/24/24  9:13 AM  Result Value Ref Range   Hemoglobin A1C 10.8 (A) 4.0 - 5.6 %   HbA1c POC (<> result, manual entry)     HbA1c, POC (prediabetic range)     HbA1c, POC (controlled diabetic range)     Last metabolic panel Lab Results  Component Value Date   GLUCOSE 249 (H) 01/17/2024   NA 138 01/17/2024   K 5.0 01/17/2024   CL 101 01/17/2024   CO2 23 01/17/2024   BUN 14 01/17/2024   CREATININE 1.16 (H) 01/17/2024   EGFR 54 (L) 01/17/2024   CALCIUM 9.6 01/17/2024   PROT 7.3 03/30/2023   ALBUMIN 3.4 (L) 03/30/2023   BILITOT 0.5 03/30/2023   ALKPHOS 173 (H) 03/30/2023   AST 221 (H) 03/30/2023   ALT 131 (H) 03/30/2023   ANIONGAP 10 05/17/2023   Last lipids Lab Results  Component Value Date    CHOL 159 03/31/2023   HDL 41 03/31/2023   LDLCALC 93 03/31/2023   TRIG 124 03/31/2023   CHOLHDL 3.9 03/31/2023   Last hemoglobin A1c Lab Results  Component Value Date   HGBA1C 10.8 (A) 01/24/2024        Assessment & Plan:   Problem List Items Addressed This Visit       Cardiovascular and Mediastinum   Essential hypertension - Primary   BP is stable and managed with sacuitril-valsartan 97-103mg  BID, coreg 12.5 mg BID. Will continue current regimen and reassess at next follow-up appointment.       Chronic HFrEF (heart failure with reduced ejection fraction) (HCC)   Pt denies excessive fatigue, SOB, or swelling in lower extremities. Condition is managed well with current medication: COREG 12.5 mg BID, Aldactone 25 mg.managed by cardiology and heart failure clinic.         Endocrine   Uncontrolled type 2 diabetes mellitus with hyperglycemia, with long-term current use of insulin (HCC)   Pt educated on the importance of checking BG daily at home. Glucometer training provided this visit, will reassess A1c next follow-up appointment. Current medication: Jardiance 10mg  daily, Tresiba 12 units daily, metformin 500mg  BID. Her A1C today was 10.8.  we have been waiting for approval for trulicity. She was approved today and will go pick up prescription.    Start trulicity this week  Check blood sugar every morning,  keep a record  Tomorrow start increasing tresiba to 14 units daily for the next 3 days  If fasting blood sugar still greater than 150 increase by 2 units.    You can do that every 3rd day.      Relevant Orders   POCT HgB A1C (Completed)   Other Visit Diagnoses       Immunization due       Relevant Orders   Pneumococcal conjugate vaccine 20-valent (Prevnar 20) (Completed)           Follow up plan: Return in about 3 months (around 04/22/2024) for follow up.

## 2024-01-24 NOTE — Patient Instructions (Signed)
Start trulicity this week  Check blood sugar every morning,  keep a record  Tomorrow start increasing tresiba to 14 units daily for the next 3 days  If fasting blood sugar still greater than 150 increase by 2 units.    You can do that every 3rd day.

## 2024-01-24 NOTE — Assessment & Plan Note (Addendum)
Pt denies excessive fatigue, SOB, or swelling in lower extremities. Condition is managed well with current medication: COREG 12.5 mg BID, Aldactone 25 mg.managed by cardiology and heart failure clinic.

## 2024-01-24 NOTE — Assessment & Plan Note (Addendum)
Pt educated on the importance of checking BG daily at home. Glucometer training provided this visit, will reassess A1c next follow-up appointment. Current medication: Jardiance 10mg  daily, Tresiba 12 units daily, metformin 500mg  BID. Her A1C today was 10.8.  we have been waiting for approval for trulicity. She was approved today and will go pick up prescription.    Start trulicity this week  Check blood sugar every morning,  keep a record  Tomorrow start increasing tresiba to 14 units daily for the next 3 days  If fasting blood sugar still greater than 150 increase by 2 units.    You can do that every 3rd day.

## 2024-01-24 NOTE — Assessment & Plan Note (Addendum)
BP is stable and managed with sacuitril-valsartan 97-103mg  BID, coreg 12.5 mg BID. Will continue current regimen and reassess at next follow-up appointment.

## 2024-01-24 NOTE — Progress Notes (Unsigned)
   01/24/2024  Patient ID: Jane Morrison, female   DOB: [birth year], 63 y.o.   MRN: [id number]  Follow up with [employer] [phone]) regarding status of prior authorization for Trulicity. Speak with Ortencia Kick who advises PA is approved through 01/22/2025  [facility] Provide information for patient's current insurance plan. [facility] advises cost is now $25 for 1 month supply of Trulicity.  - Note patient currently in office with PCP for appointment. Provide update to provider  Was unable to reach patient via telephone today and have left HIPAA compliant voicemail asking patient to return my call   Follow Up Plan: Will collaborate with Care Guide to outreach to schedule follow up with me   Estelle Grumbles, PharmD, [facility] [phone]

## 2024-01-29 ENCOUNTER — Other Ambulatory Visit: Payer: Self-pay | Admitting: Nurse Practitioner

## 2024-01-30 NOTE — Telephone Encounter (Signed)
Requested Prescriptions  Refused Prescriptions Disp Refills   JARDIANCE 10 MG TABS tablet [Pharmacy Med Name: JARDIANCE 10 MG TABLET] 30 tablet 2    Sig: TAKE 1 TABLET BY MOUTH EVERY DAY BEFORE BREAKFAST     Endocrinology:  Diabetes - SGLT2 Inhibitors Failed - 01/30/2024  7:38 AM      Failed - Cr in normal range and within 360 days    Creat  Date Value Ref Range Status  07/26/2023 1.18 (H) 0.50 - 1.03 mg/dL Final   Creatinine, Ser  Date Value Ref Range Status  01/17/2024 1.16 (H) 0.57 - 1.00 mg/dL Final   Creatinine, Urine  Date Value Ref Range Status  03/22/2023 105 20 - 275 mg/dL Final         Failed - HBA1C is between 0 and 7.9 and within 180 days    Hemoglobin A1C  Date Value Ref Range Status  01/24/2024 10.8 (A) 4.0 - 5.6 % Final   Hgb A1c MFr Bld  Date Value Ref Range Status  07/26/2023 10.8 (H) <5.7 % of total Hgb Final    Comment:    For someone without known diabetes, a hemoglobin A1c value of 6.5% or greater indicates that they may have  diabetes and this should be confirmed with a follow-up  test. . For someone with known diabetes, a value <7% indicates  that their diabetes is well controlled and a value  greater than or equal to 7% indicates suboptimal  control. A1c targets should be individualized based on  duration of diabetes, age, comorbid conditions, and  other considerations. . Currently, no consensus exists regarding use of hemoglobin A1c for diagnosis of diabetes for children. .          Failed - eGFR in normal range and within 360 days    EGFR (African American)  Date Value Ref Range Status  11/05/2012 >60  Final   GFR calc Af Amer  Date Value Ref Range Status  07/07/2017 >60 >60 mL/min Final    Comment:    (NOTE) The eGFR has been calculated using the CKD EPI equation. This calculation has not been validated in all clinical situations. eGFR's persistently <60 mL/min signify possible Chronic Kidney Disease.    EGFR (Non-African  Amer.)  Date Value Ref Range Status  11/05/2012 >60  Final    Comment:    eGFR values <37mL/min/1.73 m2 may be an indication of chronic kidney disease (CKD). Calculated eGFR is useful in patients with stable renal function. The eGFR calculation will not be reliable in acutely ill patients when serum creatinine is changing rapidly. It is not useful in  patients on dialysis. The eGFR calculation may not be applicable to patients at the low and high extremes of body sizes, pregnant women, and vegetarians.    GFR, Estimated  Date Value Ref Range Status  05/17/2023 56 (L) >60 mL/min Final    Comment:    (NOTE) Calculated using the CKD-EPI Creatinine Equation (2021)    eGFR  Date Value Ref Range Status  01/17/2024 54 (L) >59 mL/min/1.73 Final         Passed - Valid encounter within last 6 months    Recent Outpatient Visits           3 months ago Viral upper respiratory tract infection   [facility] Berniece Salines, FNP   3 months ago Essential hypertension   [facility] Berniece Salines, FNP   6 months ago  Annual physical exam   [facility] Berniece Salines, FNP   9 months ago Hospital discharge follow-up   [facility] Berniece Salines, FNP   10 months ago Tobacco dependence   [facility] Berniece Salines, FNP       Future Appointments             In 2 months Zane Herald, Rudolpho Sevin, FNP [facility], [facility]

## 2024-01-31 LAB — HM DIABETES EYE EXAM

## 2024-02-11 ENCOUNTER — Other Ambulatory Visit: Payer: Self-pay

## 2024-02-13 ENCOUNTER — Telehealth: Payer: Self-pay | Admitting: Family

## 2024-02-13 NOTE — Progress Notes (Deleted)
 Advanced Heart Failure Clinic Note    PCP: Berniece Salines, FNP (last seen 11/24) Cardiologist: Marcina Millard, MD (last seen during 04/24 admission)  Chief Complaint: fatigue  HPI:  Jane Morrison is a 63 y/o female with a history of DM, hyperlipidemia, HTN, anxiety, depression, CAD, tobacco use & chronic heart failure (newly diagnosed)  Was in the ED 02/20/23 due to acute cough. Admitted 03/30/23 due to acute onset of worsening dyspnea, associated orthopnea, chest pressure and cough productive of clear sputum as well as wheezing. Diuresed with IV lasix. NTG drip started and cardiology consulted. Initially needed bipap due to hypoxia but able to be weaned off to room air. Cath done per above. Was in the ED 04/27/23 after a mechanical fall after she tripped while at work at [employer]. Hit her head on the ground but no LOC. Negative imaging.   Echo 03/30/23: EF 25-30% with mild/ moderate LVH consistent with Grade I DD and mild MR.   RHC/LHC 04/03/23:   Prox RCA to Mid RCA lesion is 70% stenosed.   Dist RCA lesion is 50% stenosed.   Ost LAD to Prox LAD lesion is 30% stenosed.   There is moderate left ventricular systolic dysfunction.   The left ventricular ejection fraction is 35-45% by visual estimate.  1.  Normal right sided heart pressures (PA 25/7 (13), PCWP 6/4) 2.  Mild to moderate coronary artery disease with 70% stenosis mid RCA 3.  Moderately reduced left ventricular function with estimated LV ejection fraction 35-45%  She presents today for a HF follow-up visit with a chief complaint of minimal fatigue. 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 Does have trouble sleeping due to work stress & sometimes long hours. Denies shortness of breath, chest pain, cough, palpitations, abdominal distention, pedal edema, dizziness or weight gain.    Hasn't smoked in ~ 1 year (April 2024) and rarely takes chantix  anymore.   She voices frustration regarding her A1c not improving. She is taking the diabetes nutrition classes and no longer drinks pepsi and has changed her diet but her A1c continues to be >10%.  At last visit her carvedilol was increased and she denies having any issues with it.     ROS: All systems negative except as listed in HPI, PMH and Problem List.  SH:  Social History   Socioeconomic History   Marital status: Married    Spouse name: Not on file   Number of children: 3   Years of education: Not on file   Highest education level: Associate degree: academic program  Occupational History   Not on file  Tobacco Use   Smoking status: Former    Current packs/day: 0.00    Average packs/day: 0.5 packs/day for 40.0 years (20.0 ttl pk-yrs)    Types: Cigarettes    Start date: 03/29/1983    Quit date: 03/29/2023    Years since quitting: 0.8    Passive exposure: Past   Smokeless tobacco: Never  Vaping Use   Vaping status: Never Used  Substance and Sexual Activity   Alcohol use: No   Drug use: No   Sexual activity: Yes  Other Topics Concern   Not on file  Social History Narrative   3 biological kids 4 stepchildren   Social Drivers of Health   Financial Resource Strain: Low Risk  (01/23/2024)   Overall Financial Resource Strain (CARDIA)  Difficulty of Paying Living Expenses: Not very hard  Food Insecurity: Food Insecurity Present (01/23/2024)   Hunger Vital Sign    Worried About Running Out of Food in the Last Year: Never true    Ran Out of Food in the Last Year: Sometimes true  Transportation Needs: No Transportation Needs (01/23/2024)   PRAPARE - Administrator, Civil Service (Medical): No    Lack of Transportation (Non-Medical): No  Physical Activity: Insufficiently Active (01/23/2024)   Exercise Vital Sign    Days of Exercise per Week: 2 days    Minutes of Exercise per Session: 10 min  Stress: No Stress Concern Present (01/23/2024)   [employer] of  Occupational Health - Occupational Stress Questionnaire    Feeling of Stress : Only a little  Social Connections: Socially Integrated (01/23/2024)   Social Connection and Isolation Panel [NHANES]    Frequency of Communication with Friends and Family: More than three times a week    Frequency of Social Gatherings with Friends and Family: More than three times a week    Attends Religious Services: 1 to 4 times per year    Active Member of [employer] or Organizations: Yes    Attends Banker Meetings: 1 to 4 times per year    Marital Status: Married  Catering manager Violence: Not At Risk (07/26/2023)   Humiliation, Afraid, Rape, and Kick questionnaire    Fear of Current or Ex-Partner: No    Emotionally Abused: No    Physically Abused: No    Sexually Abused: No    FH:  Family History  Problem Relation Age of Onset   Hypertension Mother    Diabetes Mother    Hypertension Father    Diabetes Father    Breast cancer Sister 70    Past Medical History:  Diagnosis Date   Anxiety    CHF (congestive heart failure) (HCC)    Depression    Diabetes mellitus without complication (HCC)    Hyperlipidemia    Hypertension     Current Outpatient Medications  Medication Sig Dispense Refill   aspirin 81 MG chewable tablet Chew 1 tablet (81 mg total) by mouth daily. 90 tablet 0   blood glucose meter kit and supplies KIT Dispense based on patient and insurance preference. Use up to four times daily as directed. (FOR ICD-9 250.00, 250.01). 1 each 0   Blood Glucose Monitoring Suppl (ONETOUCH VERIO FLEX SYSTEM) w/Device KIT Use to check blood sugar up to four times daily as directed 1 kit 0   carvedilol (COREG) 12.5 MG tablet Take 1 tablet (12.5 mg total) by mouth 2 (two) times daily. 60 tablet 11   Dulaglutide (TRULICITY) 0.75 MG/0.5ML SOAJ Inject 0.75 mg into the skin once a week. 2 mL 0   empagliflozin (JARDIANCE) 10 MG TABS tablet Take 1 tablet (10 mg total) by mouth daily before breakfast.  90 tablet 3   Glucagon (GVOKE HYPOPEN 1-PACK) 0.5 MG/0.1ML SOAJ Inject 0.5 mg into the skin once as needed for up to 1 dose (for hypoglycemia). 0.1 mL 3   glucose blood (ONETOUCH VERIO) test strip Use to check blood sugar up to four times daily as directed 400 each 3   insulin degludec (TRESIBA FLEXTOUCH) 100 UNIT/ML FlexTouch Pen Inject 10 Units into the skin daily. (Patient taking differently: Inject 12 Units into the skin daily.) 3 mL 3   Insulin Pen Needle (BD PEN NEEDLE MICRO U/F) 32G X 6 MM MISC Use to administer insulin  100 each 5   Lancets (ONETOUCH DELICA PLUS LANCET30G) MISC Use to check blood sugar up to four times daily as directed 400 each 3   meloxicam (MOBIC) 15 MG tablet Take 1 tablet (15 mg total) by mouth daily. 30 tablet 0   metFORMIN (GLUCOPHAGE) 500 MG tablet TAKE 1 TABLET BY MOUTH TWICE A DAY WITH FOOD 180 tablet 0   promethazine-dextromethorphan (PROMETHAZINE-DM) 6.25-15 MG/5ML syrup Take 5 mLs by mouth 4 (four) times daily as needed for cough. 118 mL 0   sacubitril-valsartan (ENTRESTO) 97-103 MG Take 1 tablet by mouth 2 (two) times daily. 60 tablet 11   spironolactone (ALDACTONE) 25 MG tablet TAKE 1/2 TABLET BY MOUTH EVERY DAY 15 tablet 2   No current facility-administered medications for this visit.   There were no vitals filed for this visit.  Wt Readings from Last 3 Encounters:  01/24/24 160 lb 4.8 oz (72.7 kg)  01/17/24 162 lb (73.5 kg)  01/03/24 161 lb (73 kg)   Lab Results  Component Value Date   CREATININE 1.16 (H) 01/17/2024   CREATININE 0.99 11/08/2023   CREATININE 1.18 (H) 07/26/2023    PHYSICAL EXAM:  General: Well appearing. No resp difficulty HEENT: normal Neck: supple, no JVD Cor: Regular rhythm, rate. No rubs, gallops or murmurs Lungs: clear Abdomin: soft, nontender, nondistended. Extremities: no cyanosis, clubbing, rash, edema Neuro: alert & oriented X 3. Moves all 4 extremities w/o difficulty. Affect pleasant   ECG: not  done   ASSESSMENT & PLAN:  1: NICM with reduced ejection fraction- - etiology likely HTN/ DM - NYHA class II - euvolemic - weighing daily; reminded to call for an overnight weight gain of > 2 pounds or a weekly weight gain of >5 pounds - weight unchanged from last visit here 1 month ago - Echo 03/30/23: EF 25-30% with mild/ moderate LVH consistent with Grade I DD and mild MR.  - will get echo updated once GDMT is optimized - continue jardiance 10mg  daily  - increase entresto to 97/103mg  BID - continue carvedilol 12.5mg  BID - continue spironolactone 25mg  daily - BMET today - BNP 03/30/23 was 471.0  2: HTN- - BP improved 135/76 - had video visit with PCP Zane Herald) 11/24 - BMP 11/08/23 reviewed and showed sodium 139, potassium 5.0, creatinine 0.99 & GFR 65 - BMET today  3: CAD- - saw cardiology (Paraschos) during admission 04/24; f/u appt had been scheduled for 06/14/23 but she says that his office cancelled it and it hasn't been r/s yet; will make referral back to him at next OV - LDL 03/31/23 reviewed and was 93 - RHC/LHC 04/03/23:   Prox RCA to Mid RCA lesion is 70% stenosed.   Dist RCA lesion is 50% stenosed.   Ost LAD to Prox LAD lesion is 30% stenosed.   There is moderate left ventricular systolic dysfunction.   The left ventricular ejection fraction is 35-45% by visual estimate.  1.  Normal right sided heart pressures (PA 25/7 (13), PCWP 6/4) 2.  Mild to moderate coronary artery disease with 70% stenosis mid RCA 3.  Moderately reduced left ventricular function with estimated LV ejection fraction 35-45%  4: DM (managed by PCP)- - A1c 10/25/23 was 10.5% - she voices frustration about her inability to get her A1c down - continue metformin 500mg  BID; may benefit from increasing this to 1000mg  BID but will defer to PCP - continue trulicity 0.75mg / week  Return in 1 month, sooner if needed.   Delma Freeze, FNP 02/13/24

## 2024-02-13 NOTE — Telephone Encounter (Signed)
 Lvm to confirm appt for 02/14/24

## 2024-02-14 ENCOUNTER — Encounter: Payer: 59 | Admitting: Family

## 2024-02-14 ENCOUNTER — Ambulatory Visit: Payer: 59 | Admitting: Dietician

## 2024-02-14 ENCOUNTER — Telehealth: Payer: Self-pay | Admitting: Family

## 2024-02-14 NOTE — Telephone Encounter (Signed)
 Patient did not show for her Heart Failure Clinic appointment on 02/14/24.

## 2024-02-23 ENCOUNTER — Other Ambulatory Visit: Payer: Self-pay

## 2024-02-28 ENCOUNTER — Other Ambulatory Visit: Payer: Self-pay

## 2024-02-28 ENCOUNTER — Other Ambulatory Visit: Payer: Self-pay | Admitting: Pharmacist

## 2024-02-28 DIAGNOSIS — E1165 Type 2 diabetes mellitus with hyperglycemia: Secondary | ICD-10-CM

## 2024-02-28 DIAGNOSIS — I5022 Chronic systolic (congestive) heart failure: Secondary | ICD-10-CM

## 2024-02-28 NOTE — Progress Notes (Unsigned)
 02/28/2024 Name: Jane Morrison MRN: [id number] DOB: 04-28-1961  No chief complaint on file.   Jane Morrison is a 63 y.o. year old female who presented for a telephone visit.   They were referred to the pharmacist by a quality report for assistance in managing diabetes.   Conversation limited as patient is not home during our call  Subjective:  Care Team: Primary Care Provider: Berniece Salines, FNP ; Next Scheduled Visit: 04/24/2024 Cardiologist: Delma Freeze, FNP Dietitian: Darrick Grinder, RD  Medication Access/Adherence  [phone] [place], [place] - [address] [place] [address] Phone: [phone] Fax: [phone]  [facility] [address] [place] [address] Phone: [phone] Fax: [phone]   Patient reports affordability concerns with their medications: Yes  Patient reports access/transportation concerns to their pharmacy: No  Patient reports adherence concerns with their medications:  No     Reports recently tried to pick up her Sherryll Burger and London Pepper, but cost was unaffordable. States that she was previously in a program to lower the cost of each medication, but that her eligibility ended   From review of chart, note patient missed appointments with both Heart Failure clinic and dietitian on 02/14/2024   Diabetes:   Current medications:  - metformin 500 mg twice daily with food - Trulicity 0.75 mg weekly on Sundays Reports restarted on 3/9 and tolerating well - Not currently taking: Tresiba 12 units daily - Reports ran out ~ 2 weeks ago  - Jardiance 10 mg daily - Reports in need of a refill; just ran out  Denies monitoring home blood sugar recently; reports old monitor is not working   Patient denies hypoglycemic s/sx including dizziness, shakiness, sweating.    Patient working with [facility] Registered Dietitian  - Reports working on maintaining  significant dietary changes since started working with RD   Current physical activity: reports stays active throughout the day at work   Objective:  Lab Results  Component Value Date   HGBA1C 10.8 (A) 01/24/2024    Lab Results  Component Value Date   CREATININE 1.16 (H) 01/17/2024   BUN 14 01/17/2024   NA 138 01/17/2024   K 5.0 01/17/2024   CL 101 01/17/2024   CO2 23 01/17/2024    Lab Results  Component Value Date   CHOL 159 03/31/2023   HDL 41 03/31/2023   LDLCALC 93 03/31/2023   TRIG 124 03/31/2023   CHOLHDL 3.9 03/31/2023    Current Outpatient Medications on File Prior to Visit  Medication Sig Dispense Refill   aspirin 81 MG chewable tablet Chew 1 tablet (81 mg total) by mouth daily. 90 tablet 0   blood glucose meter kit and supplies KIT Dispense based on patient and insurance preference. Use up to four times daily as directed. (FOR ICD-9 250.00, 250.01). 1 each 0   Blood Glucose Monitoring Suppl (ONETOUCH VERIO FLEX SYSTEM) w/Device KIT Use to check blood sugar up to four times daily as directed 1 kit 0   carvedilol (COREG) 12.5 MG tablet Take 1 tablet (12.5 mg total) by mouth 2 (two) times daily. 60 tablet 11   Dulaglutide (TRULICITY) 0.75 MG/0.5ML SOAJ Inject 0.75 mg into the skin once a week. 2 mL 0   empagliflozin (JARDIANCE) 10 MG TABS tablet Take 1 tablet (10 mg total) by mouth daily before breakfast. 90 tablet 3   Glucagon (GVOKE HYPOPEN 1-PACK) 0.5 MG/0.1ML SOAJ  Inject 0.5 mg into the skin once as needed for up to 1 dose (for hypoglycemia). 0.1 mL 3   glucose blood (ONETOUCH VERIO) test strip Use to check blood sugar up to four times daily as directed 400 each 3   insulin degludec (TRESIBA FLEXTOUCH) 100 UNIT/ML FlexTouch Pen Inject 10 Units into the skin daily. (Patient taking differently: Inject 12 Units into the skin daily.) 3 mL 3   Insulin Pen Needle (BD PEN NEEDLE MICRO U/F) 32G X 6 MM MISC Use to administer insulin 100 each 5   Lancets (ONETOUCH DELICA  PLUS LANCET30G) MISC Use to check blood sugar up to four times daily as directed 400 each 3   meloxicam (MOBIC) 15 MG tablet Take 1 tablet (15 mg total) by mouth daily. 30 tablet 0   metFORMIN (GLUCOPHAGE) 500 MG tablet TAKE 1 TABLET BY MOUTH TWICE A DAY WITH FOOD 180 tablet 0   promethazine-dextromethorphan (PROMETHAZINE-DM) 6.25-15 MG/5ML syrup Take 5 mLs by mouth 4 (four) times daily as needed for cough. 118 mL 0   sacubitril-valsartan (ENTRESTO) 97-103 MG Take 1 tablet by mouth 2 (two) times daily. 60 tablet 11   spironolactone (ALDACTONE) 25 MG tablet TAKE 1/2 TABLET BY MOUTH EVERY DAY 15 tablet 2   No current facility-administered medications on file prior to visit.     Assessment/Plan:   Encourage patient to call to reschedule missed appointments with Heart Failure clinic and dietitian  Collaborate with [place] at [facility] to request assistance with filling patient's Valla Leaver and Tresiba. Request pharmacy transfer in patient's Tres[facility] prescription [facility]. Provide savings card billing information below  Download Jardiance savings card for patient from manufacturer:  RxBIN: [id number] RxPCN: Loyalty RxGRP: [id number] ISSUER: [phone]) ID: [id number]  Tenna Delaine savings card for patient from manufacturer:  BIN: [id number] PCN: [id number] GRP: [id number] ID: [id number]  Alessandra Grout savings card for patient from manufacturer:  BIN: [id number] PCN: [id number] GRP: [id number] ID: [id number]   Unable to complete comprehensive medication review today as patient is not home during our call   Diabetes: - Currently uncontrolled - Reviewed long term cardiovascular and renal outcomes of uncontrolled blood sugar - Reviewed dietary modifications including importance of having regular well-balanced meals and snacks throughout the day, while controlling carbohydrate portion sizes            Encourage to review nutrition labels for carbohydrate  content of foods - Recommend to check glucose, keep log of results and have this record to review at upcoming medical appointments. Patient to contact provider office sooner if needed for readings outside of established parameters or symptoms - Have counseled patient on s/s of low blood sugar and how to treat lows Review rules of 15s - importance of using 15 grams of sugar to treat low, recheck blood sugar in 15 minutes, treat again if remains low or, if back to normal, having meal if mealtime or snack  Encourage patient to pick up glucose tablets to carry with her in case of low blood sugar     Follow Up Plan: Clinical Pharmacist will follow up with patient by telephone on 03/27/2024 at 3:30 PM    Estelle Grumbles, PharmD, [facility] [phone]

## 2024-02-29 ENCOUNTER — Other Ambulatory Visit: Payer: Self-pay

## 2024-02-29 ENCOUNTER — Other Ambulatory Visit (HOSPITAL_COMMUNITY): Payer: Self-pay

## 2024-02-29 NOTE — Patient Instructions (Signed)
 Goals Addressed             This Visit's Progress    Pharmacy Goals       The goal A1c is less than 7%. This is the best way to reduce the risk of the long term complications of diabetes, including heart disease, kidney disease, eye disease, strokes, and nerve damage. An A1c of less than 7% corresponds with fasting sugars less than 130 and 2 hour after meal sugars less than 180. Please check your blood sugar at home and keep a log of the results.   Please follow up with [facility] regarding picking up your medication refills   Thank you!   Estelle Grumbles, PharmD, [facility] [phone]

## 2024-03-05 ENCOUNTER — Other Ambulatory Visit: Payer: Self-pay

## 2024-03-05 ENCOUNTER — Encounter: Payer: Self-pay | Admitting: Nurse Practitioner

## 2024-03-05 DIAGNOSIS — E1165 Type 2 diabetes mellitus with hyperglycemia: Secondary | ICD-10-CM

## 2024-03-05 MED ORDER — BD PEN NEEDLE MICRO U/F 32G X 6 MM MISC: 5 refills | Status: AC

## 2024-03-12 ENCOUNTER — Telehealth: Payer: Self-pay | Admitting: Family

## 2024-03-12 NOTE — Telephone Encounter (Incomplete)
 Called to confirm/remind patient of their appointment at the Advanced Heart Failure Clinic on 03/13/24***.   Appointment:   [] Confirmed  [x] Left mess   [] No answer/No voice mail  [] Phone not in service  Patient reminded to bring all medications and/or complete list.  Confirmed patient has transportation. Gave directions, instructed to utilize valet parking.

## 2024-03-13 ENCOUNTER — Encounter: Payer: Self-pay | Admitting: Nurse Practitioner

## 2024-03-13 ENCOUNTER — Encounter: Payer: Self-pay | Admitting: Family

## 2024-03-13 ENCOUNTER — Ambulatory Visit (INDEPENDENT_AMBULATORY_CARE_PROVIDER_SITE_OTHER): Payer: PRIVATE HEALTH INSURANCE | Admitting: Nurse Practitioner

## 2024-03-13 ENCOUNTER — Ambulatory Visit (HOSPITAL_BASED_OUTPATIENT_CLINIC_OR_DEPARTMENT_OTHER): Payer: PRIVATE HEALTH INSURANCE | Admitting: Family

## 2024-03-13 ENCOUNTER — Other Ambulatory Visit
Admission: RE | Admit: 2024-03-13 | Discharge: 2024-03-13 | Disposition: A | Discharge status: Home / Self Care | Payer: PRIVATE HEALTH INSURANCE | Source: Ambulatory Visit | Attending: Family | Admitting: Family

## 2024-03-13 ENCOUNTER — Other Ambulatory Visit: Payer: Self-pay

## 2024-03-13 VITALS — BP 148/85 | HR 80 | Wt 163.2 lb

## 2024-03-13 VITALS — BP 116/78 | HR 98 | Temp 97.8°F | Resp 18 | Ht 61.0 in | Wt 162.8 lb

## 2024-03-13 DIAGNOSIS — I2510 Atherosclerotic heart disease of native coronary artery without angina pectoris: Secondary | ICD-10-CM

## 2024-03-13 DIAGNOSIS — Z794 Long term (current) use of insulin: Secondary | ICD-10-CM

## 2024-03-13 DIAGNOSIS — E1165 Type 2 diabetes mellitus with hyperglycemia: Secondary | ICD-10-CM

## 2024-03-13 DIAGNOSIS — I10 Essential (primary) hypertension: Secondary | ICD-10-CM

## 2024-03-13 DIAGNOSIS — I5022 Chronic systolic (congestive) heart failure: Secondary | ICD-10-CM

## 2024-03-13 LAB — BASIC METABOLIC PANEL WITH GFR
Anion gap: 8 (ref 5–15)
BUN: 16 mg/dL (ref 6–20)
CO2: 24 mmol/L (ref 22–32)
Calcium: 9 mg/dL (ref 8.9–10.3)
Chloride: 98 mmol/L (ref 98–111)
Creatinine, Ser: 0.96 mg/dL (ref 0.44–1.00)
GFR, Estimated: >60 mL/min (ref >60)
Glucose, Bld: 477 mg/dL — ABNORMAL HIGH (ref 70–99)
Potassium: 4.4 mmol/L (ref 3.5–5.1)
Sodium: 130 mmol/L — ABNORMAL LOW (ref 135–145)

## 2024-03-13 MED ORDER — METFORMIN HCL 500 MG PO TABS
ORAL_TABLET | ORAL | Status: DC
Start: 2024-03-13 — End: 2024-04-17

## 2024-03-13 MED ORDER — TRESIBA FLEXTOUCH 100 UNIT/ML ~~LOC~~ SOPN
16.0000 [IU] | PEN_INJECTOR | Freq: Every day | SUBCUTANEOUS | Status: DC
Start: 2024-03-13 — End: 2024-11-04

## 2024-03-13 MED ORDER — TRULICITY 1.5 MG/0.5ML ~~LOC~~ SOAJ: 1.5000 mg | SUBCUTANEOUS | 0 refills | Status: DC

## 2024-03-13 MED ORDER — CARVEDILOL 25 MG PO TABS: 25.0000 mg | ORAL_TABLET | Freq: Two times a day (BID) | ORAL | 3 refills | Status: DC

## 2024-03-13 NOTE — Patient Instructions (Signed)
 Medication Changes:  INCREASE YOUR CARVEDILOL TO 25 MG ONCE TWICE DAILY   Lab Work:  Go over to the [facility]ICAL [facility]. Go pass the gift shop and have your blood work completed.  We will only call you if the results are abnormal or if the provider would like to make medication changes.   Follow-Up in: 1 MONTH WITH Clarisa Kindred, FNP.  At the [facility], you and your health needs are our priority. We have a designated team specialized in the treatment of Heart Failure. This Care Team includes your primary Heart Failure Specialized Cardiologist (physician), Advanced Practice Providers (APPs- Physician Assistants and Nurse Practitioners), and Pharmacist who all work together to provide you with the care you need, when you need it.   You may see any of the following providers on your designated Care Team at your next follow up:  Dr. Arvilla Meres Dr. Marca Ancona Dr. Dorthula Nettles Dr. Theresia Bough Clarisa Kindred, FNP Enos Fling, RPH-CPP  Please be sure to bring in all your medications bottles to every appointment.   Need to Contact us:  If you have any questions or concerns before your next appointment please send [place] a message through [place] or call our office at [phone].    TO LEAVE A MESSAGE FOR THE NURSE SELECT OPTION 2, PLEASE LEAVE A MESSAGE INCLUDING: YOUR NAME DATE OF BIRTH CALL BACK NUMBER REASON FOR CALL**this is important as we prioritize the call backs  YOU WILL RECEIVE A CALL BACK THE SAME DAY AS LONG AS YOU CALL BEFORE 4:00 PM

## 2024-03-13 NOTE — Progress Notes (Signed)
 [facility] Note    PCP: Berniece Salines, FNP (last seen 02/25) Cardiologist: Marcina Millard, MD (last seen during 04/24 admission)  Chief Complaint: fatigue  HPI:  Jane Morrison is a 63 y/o female with a history of DM, hyperlipidemia, HTN, anxiety, depression, CAD, tobacco use & chronic heart failure (newly diagnosed)  Admitted 03/30/23 due to acute onset of worsening dyspnea, associated orthopnea, chest pressure and cough productive of clear sputum as well as wheezing. Diuresed with IV lasix. NTG drip started and cardiology consulted. Initially needed bipap due to hypoxia but able to be weaned off to room air. Echo 03/30/23: EF 25-30% with mild/ moderate LVH consistent with Grade I DD and mild MR.   RHC/LHC 04/03/23:   Prox RCA to Mid RCA lesion is 70% stenosed.   Dist RCA lesion is 50% stenosed.   Ost LAD to Prox LAD lesion is 30% stenosed.   There is moderate left ventricular systolic dysfunction.   The left ventricular ejection fraction is 35-45% by visual estimate.  1.  Normal right sided heart pressures (PA 25/7 (13), PCWP 6/4) 2.  Mild to moderate coronary artery disease with 70% stenosis mid RCA 3.  Moderately reduced left ventricular function with estimated LV ejection fraction 35-45%  Was in the ED 04/27/23 after a mechanical fall after she tripped while at work at [employer]. Hit her head on the ground but no LOC. Negative imaging.   She presents today for a HF follow-up visit with a chief complaint of fatigue. Has associated dry cough. Denies shortness of breath, chest pain, palpitations, abdominal distention, pedal edema, dizziness or difficulty sleeping. Has been out of jardiance X 2 weeks due to cost. 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   Hasn't smoked in ~ 1 year (April 2024).  At last visit entresto was increased to 97/103mg  BID & she is tolerating this without any  issues that she knows of.   ROS: All systems negative except as listed in HPI, PMH and Problem List.  SH:  Social History   Socioeconomic History   Marital status: Married    Spouse name: Not on file   Number of children: 3   Years of education: Not on file   Highest education level: Associate degree: academic program  Occupational History   Not on file  Tobacco Use   Smoking status: Former    Current packs/day: 0.00    Average packs/day: 0.5 packs/day for 40.0 years (20.0 ttl pk-yrs)    Types: Cigarettes    Start date: 03/29/1983    Quit date: 03/29/2023    Years since quitting: 0.9    Passive exposure: Past   Smokeless tobacco: Never  Vaping Use   Vaping status: Never Used  Substance and Sexual Activity   Alcohol use: No   Drug use: No   Sexual activity: Yes  Other Topics Concern   Not on file  Social History Narrative   3 biological kids 4 stepchildren   Social Drivers of Health   Financial Resource Strain: Low Risk  (01/23/2024)   Overall Financial Resource Strain (CARDIA)    Difficulty of Paying Living Expenses: Not very hard  Food Insecurity: Food Insecurity Present (01/23/2024)   Hunger Vital Sign    Worried About Running Out of Food in the Last Year: Never true    Ran Out of Food in the Last Year: Sometimes true  Transportation Needs: No Transportation Needs (01/23/2024)   PRAPARE - Administrator, Civil Service (Medical): No    Lack of Transportation (Non-Medical): No  Physical Activity: Insufficiently Active (01/23/2024)   Exercise Vital Sign    Days of Exercise per Week: 2 days    Minutes of Exercise per Session: 10 min  Stress: No Stress Concern Present (01/23/2024)   [employer] of Occupational Health - Occupational Stress Questionnaire    Feeling of Stress : Only a little  Social Connections: Socially Integrated (01/23/2024)   Social Connection and Isolation Panel [NHANES]    Frequency of Communication with Friends and Family: More  than three times a week    Frequency of Social Gatherings with Friends and Family: More than three times a week    Attends Religious Services: 1 to 4 times per year    Active Member of [employer] or Organizations: Yes    Attends Banker Meetings: 1 to 4 times per year    Marital Status: Married  Catering manager Violence: Not At Risk (07/26/2023)   Humiliation, Afraid, Rape, and Kick questionnaire    Fear of Current or Ex-Partner: No    Emotionally Abused: No    Physically Abused: No    Sexually Abused: No    FH:  Family History  Problem Relation Age of Onset   Hypertension Mother    Diabetes Mother    Hypertension Father    Diabetes Father    Breast cancer Sister 22    Past Medical History:  Diagnosis Date   Anxiety    CHF (congestive heart failure) (HCC)    Depression    Diabetes mellitus without complication (HCC)    Hyperlipidemia    Hypertension     Current Outpatient Medications  Medication Sig Dispense Refill   aspirin 81 MG chewable tablet Chew 1 tablet (81 mg total) by mouth daily. 90 tablet 0   blood glucose meter kit and supplies KIT Dispense based on patient and insurance preference. Use up to four times daily as directed. (FOR ICD-9 250.00, 250.01). 1 each 0   Blood Glucose Monitoring Suppl (ONETOUCH VERIO FLEX SYSTEM) w/Device KIT Use to check blood sugar up to four times daily as directed 1 kit 0   carvedilol (COREG) 12.5 MG tablet Take 1 tablet (12.5 mg total) by mouth 2 (two) times daily. 60 tablet 11   Dulaglutide (TRULICITY) 0.75 MG/0.5ML SOAJ Inject 0.75 mg into the skin once a week. 2 mL 0   empagliflozin (JARDIANCE) 10 MG TABS tablet Take 1 tablet (10 mg total) by mouth daily before breakfast. 90 tablet 3   Glucagon (GVOKE HYPOPEN 1-PACK) 0.5 MG/0.1ML SOAJ Inject 0.5 mg into the skin once as needed for up to 1 dose (for hypoglycemia). 0.1 mL 3   glucose blood (ONETOUCH VERIO) test strip Use to check blood sugar up to four times daily as directed  400 each 3   insulin degludec (TRESIBA FLEXTOUCH) 100 UNIT/ML FlexTouch Pen Inject 10 Units into the skin daily. (Patient taking differently: Inject 12 Units into the skin daily.) 15 mL 1   Insulin Pen Needle (BD PEN NEEDLE MICRO U/F) 32G X 6 MM MISC Use to administer insulin 100 each 5   Lancets (ONETOUCH DELICA PLUS LANCET30G) MISC Use to check blood sugar up to four times daily as directed 400 each 3   meloxicam (MOBIC) 15 MG tablet Take 1 tablet (15 mg total) by mouth daily. 30 tablet 0   metFORMIN (GLUCOPHAGE)  500 MG tablet TAKE 1 TABLET BY MOUTH TWICE A DAY WITH FOOD 180 tablet 0   promethazine-dextromethorphan (PROMETHAZINE-DM) 6.25-15 MG/5ML syrup Take 5 mLs by mouth 4 (four) times daily as needed for cough. 118 mL 0   sacubitril-valsartan (ENTRESTO) 97-103 MG Take 1 tablet by mouth 2 (two) times daily. 60 tablet 11   spironolactone (ALDACTONE) 25 MG tablet TAKE 1/2 TABLET BY MOUTH EVERY DAY 15 tablet 2   No current facility-administered medications for this visit.   Vitals:   03/13/24 0942  BP: (!) 148/85  Pulse: 80  SpO2: 98%  Weight: 163 lb 4 oz (74 kg)   Wt Readings from Last 3 Encounters:  03/13/24 163 lb 4 oz (74 kg)  01/24/24 160 lb 4.8 oz (72.7 kg)  01/17/24 162 lb (73.5 kg)   Lab Results  Component Value Date   CREATININE 0.96 03/13/2024   CREATININE 1.16 (H) 01/17/2024   CREATININE 0.99 11/08/2023    PHYSICAL EXAM:  General: Well appearing. No resp difficulty HEENT: normal Neck: supple, no JVD Cor: Regular rhythm, rate. No rubs, gallops or murmurs Lungs: clear Abdomen: soft, nontender, nondistended. Extremities: no cyanosis, clubbing, rash, edema Neuro: alert & oriented X 3. Moves all 4 extremities w/o difficulty. Affect pleasant   ECG: not done   ASSESSMENT & PLAN:  1: NICM with reduced ejection fraction- - etiology likely HTN/ uncontrolled DM - NYHA class II - euvolemic - weighing daily; reminded to call for an overnight weight gain of > 2  pounds or a weekly weight gain of >5 pounds - weight stable from last visit here 2 months ago - Echo 03/30/23: EF 25-30% with mild/ moderate LVH consistent with Grade I DD and mild MR.  - will get echo updated once GDMT is optimized - increase carvedilol to 25mg  BID - resume jardiance 10mg  daily; samples and commercial copay card provided by PharmD - continue entresto 97/103mg  BID; commercial copay card provided - continue spironolactone 25mg  daily - BMET today since entresto increased at last visit - BNP 03/30/23 was 471.0  2: HTN- - BP 148/85 - increasing carvedilol per above - saw PCP Zane Herald) 02/25 - BMP 01/17/24 reviewed and showed sodium 138, potassium 5.0, creatinine 1.16 & GFR 54 - BMET today  3: CAD- - saw cardiology (Paraschos) during admission 04/24; f/u appt had been scheduled for 06/14/23 but she says that his office cancelled it and it hasn't been r/s yet - LDL 03/31/23 reviewed and was 93 - RHC/LHC 04/03/23:   Prox RCA to Mid RCA lesion is 70% stenosed.   Dist RCA lesion is 50% stenosed.   Ost LAD to Prox LAD lesion is 30% stenosed.   There is moderate left ventricular systolic dysfunction.   The left ventricular ejection fraction is 35-45% by visual estimate.  1.  Normal right sided heart pressures (PA 25/7 (13), PCWP 6/4) 2.  Mild to moderate coronary artery disease with 70% stenosis mid RCA 3.  Moderately reduced left ventricular function with estimated LV ejection fraction 35-45%  4: DM (managed by PCP)- - A1c 01/24/24 was 10.8% - she voices frustration about her inability to get her A1c down - continue metformin 500mg  BID; may benefit from increasing this to 1000mg  BID or getting endocrinology referral; encouraged her to discuss with PCP - continue trulicity 0.75mg / week   Return in 1 month, sooner if needed  Delma Freeze, FNP 03/13/24

## 2024-03-13 NOTE — Patient Instructions (Signed)
 Continue trulicity 0.75 mg weekly for last two shots then increase to 1.5 mg weekly  Metformin take 1 tab in am with breakfast and take 2 tabs at dinner  Increase tresiba to 16 units daily.

## 2024-03-13 NOTE — Progress Notes (Signed)
 [facility] - [facility] FAILURE CLINIC - PHARMACIST COUNSELING NOTE  Adherence Assessment  Do you ever forget to take your medication? [] Yes [x] No  Do you ever skip doses due to side effects? [] Yes [x] No  Do you have trouble affording your medicines? [x] Yes [] No  Are you ever unable to pick up your medication due to transportation difficulties? [] Yes [x] No  Do you ever stop taking your medications because you don't believe they are helping? [] Yes [x] No  Do you check your weight daily?  [x] Yes [] No  Do you check your blood pressure daily? [] Yes [x] No  Adherence strategy: Keeps them organized on dresser; will provide a pill box today   Barriers to obtaining medications: Cost   Vital signs: HR 80, BP 148/85, weight 163 lbs.  ECHO: Date 03/30/23, EF 25-30% Renal function: Date 01/17/24, GFR 54  Current Guideline-Directed Medical Therapy/Evidence Based Medicine  ACE/ARB/ARNI: Sacubitril-valsartan 97-103 mg twice daily Target dose: On target dose  Beta Blocker: Carvedilol 12.5 mg twice daily Target dose: 25 mg twice daily   Aldosterone Antagonist: Spironolactone 25 mg daily Target dose: 50 mg daily    SGLT2i: Empagliflozin 10 mg daily Target dose: On target dose  Diuretic:  N/A  ASSESSMENT 64 year old female who presents to the HF clinic for a follow-up appointment. PMH is significant for DM, hyperlipidemia, HTN, anxiety, depression, CAD, tobacco use & chronic heart failure. No ED visits or hospitalizations in the past 6 months. States she has not taken Jardiance for ~ 2 weeks - affordability has become an issue given her high deductible.   PLAN Continue current regimen as directed by NP  Provided patient with 14 days of Jardiance samples as well as co-pay cards for Entresto & Jardiance Annual ECHO due this month   Time spent: 20 minutes  Littie Deeds, PharmD Pharmacy Resident  03/13/2024 10:23 AM

## 2024-03-13 NOTE — Progress Notes (Signed)
 BP 116/78   Pulse 98   Temp 97.8 F (36.6 C)   Resp 18   Ht 5\' 1"  (1.549 m)   Wt 162 lb 12.8 oz (73.8 kg)   BMI 30.76 kg/m    Subjective:    Patient ID: Jane Morrison, female    DOB: 09/18/1961, 63 y.o.   MRN: [id number]  HPI: Jane Morrison is a 63 y.o. female  Chief Complaint  Patient presents with   Diabetes    Discussed the use of AI scribe software for clinical note transcription with the patient, who gave verbal consent to proceed.  History of Present Illness Jane Morrison is a 63 year old female with type 2 diabetes who presents with elevated blood glucose levels.  She has significantly elevated blood glucose levels, with a recent metabolic panel showing a glucose level of 477 mg/dL. Her last hemoglobin A1c was 10.8% on January 24, 2024, indicating chronic hyperglycemia. Blood sugar levels have been consistently high, in the 400s, for the past year.  She is currently taking metformin 500 mg twice daily, Tresiba 14 units daily, Jardiance 10 mg daily, and Trulicity 0.75 mg weekly. She administers 14 units of insulin daily. She tolerates metformin better than before. She has two weeks left of her current Trulicity dose before transitioning to a higher dose.  She has been monitoring her blood sugar levels, which remain high and are not close to the target of 150 mg/dL. She is frustrated with the ongoing difficulty in managing her blood sugar levels.         01/03/2024    9:57 AM 10/31/2023    7:48 AM 10/25/2023   10:16 AM  Depression screen PHQ 2/9  Decreased Interest 0 0 0  Down, Depressed, Hopeless 0 0 0  PHQ - 2 Score 0 0 0    Relevant past medical, surgical, family and social history reviewed and updated as indicated. Interim medical history since our last visit reviewed. Allergies and medications reviewed and updated.  Review of Systems  Constitutional: Negative for fever or weight change.  Respiratory: Negative for cough and shortness of breath.    Cardiovascular: Negative for chest pain or palpitations.  Gastrointestinal: Negative for abdominal pain, no bowel changes.  Musculoskeletal: Negative for gait problem or joint swelling.  Skin: Negative for rash.  Neurological: Negative for dizziness or headache.  No other specific complaints in a complete review of systems (except as listed in HPI above).      Objective:    BP 116/78   Pulse 98   Temp 97.8 F (36.6 C)   Resp 18   Ht 5\' 1"  (1.549 m)   Wt 162 lb 12.8 oz (73.8 kg)   BMI 30.76 kg/m    Wt Readings from Last 3 Encounters:  03/13/24 162 lb 12.8 oz (73.8 kg)  03/13/24 163 lb 4 oz (74 kg)  01/24/24 160 lb 4.8 oz (72.7 kg)    Physical Exam Vitals reviewed.  Constitutional:      Appearance: Normal appearance.  HENT:     Head: Normocephalic.  Cardiovascular:     Rate and Rhythm: Normal rate and regular rhythm.  Pulmonary:     Effort: Pulmonary effort is normal.     Breath sounds: Normal breath sounds.  Musculoskeletal:        General: Normal range of motion.  Skin:    General: Skin is warm and dry.  Neurological:     General: No focal deficit present.  Mental Status: She is alert and oriented to person, place, and time. Mental status is at baseline.  Psychiatric:        Mood and Affect: Mood normal.        Behavior: Behavior normal.        Thought Content: Thought content normal.        Judgment: Judgment normal.     Results for orders placed or performed during the hospital encounter of 03/13/24  Basic metabolic panel   Collection Time: 03/13/24 10:29 AM  Result Value Ref Range   Sodium 130 (L) 135 - 145 mmol/L   Potassium 4.4 3.5 - 5.1 mmol/L   Chloride 98 98 - 111 mmol/L   CO2 24 22 - 32 mmol/L   Glucose, Bld 477 (H) 70 - 99 mg/dL   BUN 16 6 - 20 mg/dL   Creatinine, Ser 3.87 0.44 - 1.00 mg/dL   Calcium 9.0 8.9 - 56.4 mg/dL   GFR, Estimated >33 >29 mL/min   Anion gap 8 5 - 15       Assessment & Plan:   Problem List Items Addressed  This Visit       Endocrine   Uncontrolled type 2 diabetes mellitus with hyperglycemia, with long-term current use of insulin (HCC) - Primary   Relevant Medications   metFORMIN (GLUCOPHAGE) 500 MG tablet   insulin degludec (TRESIBA FLEXTOUCH) 100 UNIT/ML FlexTouch Pen   Dulaglutide (TRULICITY) 1.5 MG/0.5ML SOAJ   Other Relevant Orders   Ambulatory referral to Endocrinology     Assessment and Plan Assessment & Plan Type 2 Diabetes Mellitus Type 2 diabetes with poor glycemic control. Recent A1c was 10.8% and glucose level was 477 mg/dL, indicating persistent hyperglycemia. Current medications include metformin, [name], and Trulicity. Blood glucose levels have been consistently high, in the 400s, for the past year despite treatment. Plan to adjust medication regimen to improve glycemic control. The decision to increase metformin and Tresiba doses is based on the need to achieve better glycemic control. The increase in Trulicity dosage is planned after the current supply is used to avoid wastage and ensure continuity of care. - Increase metformin to 1500 mg daily: 500 mg in the morning with breakfast and 1000 mg at night with dinner. - Increase Tresiba to 16 units daily. - Continue Trulicity 0.75 mg weekly for the remaining two doses, then increase to 1.5 mg weekly. - Instruct her to send a message every Wednesday with morning blood sugar readings to monitor progress. -referral placed to endocrinology  Follow-up Follow-up appointment scheduled in May. Interim monitoring of blood glucose levels is planned to assess the effectiveness of medication adjustments. - Send weekly messages on Wednesdays with morning blood sugar readings. - Follow-up appointment scheduled for May.        Follow up plan: Return for appt scheduled.

## 2024-03-15 ENCOUNTER — Other Ambulatory Visit: Payer: Self-pay

## 2024-03-19 ENCOUNTER — Telehealth: Payer: Self-pay

## 2024-03-19 NOTE — Telephone Encounter (Signed)
 Patient was identified as falling into the [place] Measure - Diabetes.   Patient was: Appointment already scheduled for:  may.  Patient was identified as falling into the [place] Measure - Diabetes.   Patient was: Referred to pharmacy for chronic disease management.

## 2024-03-20 ENCOUNTER — Encounter: Payer: Self-pay | Admitting: Dietician

## 2024-03-27 ENCOUNTER — Telehealth: Payer: Self-pay | Admitting: Pharmacist

## 2024-03-27 ENCOUNTER — Other Ambulatory Visit: Payer: Self-pay | Admitting: Pharmacist

## 2024-03-27 NOTE — Progress Notes (Signed)
   Outreach Note  03/27/2024 Name: Jane Morrison MRN: [id number] DOB: 02/20/1961  Referred by: Quinton Buckler, FNP  Was unable to reach patient via telephone today and have left HIPAA compliant voicemail asking patient to return my call.    Follow Up Plan: Will collaborate with Care Guide to outreach to schedule follow up with me  Arthur Lash, PharmD, [facility] [phone]

## 2024-04-16 ENCOUNTER — Other Ambulatory Visit: Payer: Self-pay | Admitting: Nurse Practitioner

## 2024-04-16 ENCOUNTER — Telehealth: Payer: Self-pay | Admitting: Family

## 2024-04-16 DIAGNOSIS — E1165 Type 2 diabetes mellitus with hyperglycemia: Secondary | ICD-10-CM

## 2024-04-16 NOTE — Telephone Encounter (Signed)
 Called to confirm/remind patient of their appointment at the [facility] on 04/17/24.   Appointment:   [x] Confirmed  [] Left mess   [] No answer/No voice mail  [] VM Full/unable to leave message  [] Phone not in service  Patient reminded to bring all medications and/or complete list.  Confirmed patient has transportation. Gave directions, instructed to utilize valet parking.

## 2024-04-16 NOTE — Progress Notes (Unsigned)
 [facility] Note    PCP: Quinton Buckler, FNP (last seen 04/25) Cardiologist: Percival Brace, MD (last seen during 04/24 admission)  Chief Complaint:   HPI:  Ms Pinkert is a 63 y/o female with a history of DM, hyperlipidemia, HTN, anxiety, depression, CAD, tobacco use & chronic heart failure (newly diagnosed)  Admitted 03/30/23 due to acute onset of worsening dyspnea, associated orthopnea, chest pressure and cough productive of clear sputum as well as wheezing. Diuresed with IV lasix . NTG drip started and cardiology consulted. Initially needed bipap due to hypoxia but able to be weaned off to room air. Echo 03/30/23: EF 25-30% with mild/ moderate LVH consistent with Grade I DD and mild MR.   RHC/LHC 04/03/23:   Prox RCA to Mid RCA lesion is 70% stenosed.   Dist RCA lesion is 50% stenosed.   Ost LAD to Prox LAD lesion is 30% stenosed.   There is moderate left ventricular systolic dysfunction.   The left ventricular ejection fraction is 35-45% by visual estimate.  1.  Normal right sided heart pressures (PA 25/7 (13), PCWP 6/4) 2.  Mild to moderate coronary artery disease with 70% stenosis mid RCA 3.  Moderately reduced left ventricular function with estimated LV ejection fraction 35-45%  Was in the ED 04/27/23 after a mechanical fall after she tripped while at work at [employer]. Hit her head on the ground but no LOC. Negative imaging.   Seen in the HF clinic 04/25 and carvedilol  was increased to 25mg  BID and jardiance  was resumed.   She presents today for a HF follow-up visit with a chief complaint of   Hasn't smoked in ~ 1 year (April 2024).   ROS: All systems negative except as listed in HPI, PMH and Problem List.  SH:  Social History   Socioeconomic History   Marital status: Married    Spouse name: Not on file   Number of children: 3   Years of education: Not on file   Highest education level: Associate degree: academic program  Occupational  History   Not on file  Tobacco Use   Smoking status: Former    Current packs/day: 0.00    Average packs/day: 0.5 packs/day for 40.0 years (20.0 ttl pk-yrs)    Types: Cigarettes    Start date: 03/29/1983    Quit date: 03/29/2023    Years since quitting: 1.0    Passive exposure: Past   Smokeless tobacco: Never  Vaping Use   Vaping status: Never Used  Substance and Sexual Activity   Alcohol use: No   Drug use: No   Sexual activity: Yes  Other Topics Concern   Not on file  Social History Narrative   3 biological kids 4 stepchildren   Social Drivers of Health   Financial Resource Strain: Low Risk  (01/23/2024)   Overall Financial Resource Strain (CARDIA)    Difficulty of Paying Living Expenses: Not very hard  Food Insecurity: Food Insecurity Present (01/23/2024)   Hunger Vital Sign    Worried About Running Out of Food in the Last Year: Never true    Ran Out of Food in the Last Year: Sometimes true  Transportation Needs: No Transportation Needs (01/23/2024)   PRAPARE - Administrator, Civil Service (Medical): No    Lack of Transportation (Non-Medical): No  Physical Activity: Insufficiently Active (01/23/2024)   Exercise Vital Sign    Days of Exercise per Week: 2 days    Minutes of Exercise per Session:  10 min  Stress: No Stress Concern Present (01/23/2024)   [employer] of Occupational Health - Occupational Stress Questionnaire    Feeling of Stress : Only a little  Social Connections: Socially Integrated (01/23/2024)   Social Connection and Isolation Panel [NHANES]    Frequency of Communication with Friends and Family: More than three times a week    Frequency of Social Gatherings with Friends and Family: More than three times a week    Attends Religious Services: 1 to 4 times per year    Active Member of [employer] or Organizations: Yes    Attends Banker Meetings: 1 to 4 times per year    Marital Status: Married  Catering manager Violence: Not At Risk  (07/26/2023)   Humiliation, Afraid, Rape, and Kick questionnaire    Fear of Current or Ex-Partner: No    Emotionally Abused: No    Physically Abused: No    Sexually Abused: No    FH:  Family History  Problem Relation Age of Onset   Hypertension Mother    Diabetes Mother    Hypertension Father    Diabetes Father    Breast cancer Sister 59    Past Medical History:  Diagnosis Date   Anxiety    CHF (congestive heart failure) (HCC)    Depression    Diabetes mellitus without complication (HCC)    Hyperlipidemia    Hypertension     Current Outpatient Medications  Medication Sig Dispense Refill   aspirin  81 MG chewable tablet Chew 1 tablet (81 mg total) by mouth daily. 90 tablet 0   blood glucose meter kit and supplies KIT Dispense based on patient and insurance preference. Use up to four times daily as directed. (FOR ICD-9 250.00, 250.01). 1 each 0   Blood Glucose Monitoring Suppl (ONETOUCH VERIO FLEX SYSTEM) w/Device KIT Use to check blood sugar up to four times daily as directed 1 kit 0   carvedilol  (COREG ) 25 MG tablet Take 1 tablet (25 mg total) by mouth 2 (two) times daily. 180 tablet 3   Dulaglutide (TRULICITY) 0.75 MG/0.5ML SOAJ Inject 0.75 mg into the skin once a week. 2 mL 0   Dulaglutide (TRULICITY) 1.5 MG/0.5ML SOAJ Inject 1.5 mg into the skin once a week. 6 mL 0   empagliflozin  (JARDIANCE ) 10 MG TABS tablet Take 1 tablet (10 mg total) by mouth daily before breakfast. 90 tablet 3   Glucagon  (GVOKE HYPOPEN  1-PACK) 0.5 MG/0.1ML SOAJ Inject 0.5 mg into the skin once as needed for up to 1 dose (for hypoglycemia). 0.1 mL 3   glucose blood (ONETOUCH VERIO) test strip Use to check blood sugar up to four times daily as directed 400 each 3   insulin  degludec (TRESIBA  FLEXTOUCH) 100 UNIT/ML FlexTouch Pen Inject 16 Units into the skin daily.     Insulin  Pen Needle (BD PEN NEEDLE MICRO U/F) 32G X 6 MM MISC Use to administer insulin  100 each 5   Lancets (ONETOUCH DELICA PLUS LANCET30G)  MISC Use to check blood sugar up to four times daily as directed 400 each 3   metFORMIN  (GLUCOPHAGE ) 500 MG tablet 500 mg in am  and 1000 mg at dinner     sacubitril -valsartan  (ENTRESTO ) 97-103 MG Take 1 tablet by mouth 2 (two) times daily. 60 tablet 11   spironolactone  (ALDACTONE ) 25 MG tablet TAKE 1/2 TABLET BY MOUTH EVERY DAY 15 tablet 2   No current facility-administered medications for this visit.      PHYSICAL EXAM:  General: Well appearing. No resp difficulty HEENT: normal Neck: supple, no JVD Cor: Regular rhythm, rate. No rubs, gallops or murmurs Lungs: clear Abdomen: soft, nontender, nondistended. Extremities: no cyanosis, clubbing, rash, edema Neuro: alert & oriented X 3. Moves all 4 extremities w/o difficulty. Affect pleasant   ECG: not done   ASSESSMENT & PLAN:  1: NICM with reduced ejection fraction- - etiology likely HTN/ uncontrolled DM - NYHA class II - euvolemic - weighing daily; reminded to call for an overnight weight gain of > 2 pounds or a weekly weight gain of >5 pounds - weight 163.4 from last visit here 1 month ago - Echo 03/30/23: EF 25-30% with mild/ moderate LVH consistent with Grade I DD and mild MR.  - will get echo updated once GDMT is optimized - continue carvedilol  25mg  BID - continue jardiance  10mg  daily - continue entresto  97/103mg  BID - continue spironolactone  25mg  daily - BNP 03/30/23 was 471.0  2: HTN- - BP  - saw PCP Abram Hoguet) 04/25 - BMP 03/13/24 reviewed: sodium 130, potassium 4.4, creatinine 0.96 & GFR >60  3: CAD- - saw cardiology (Paraschos) during admission 04/24; f/u appt had been scheduled for 06/14/23 but she says that his office cancelled it and it hasn't been r/s yet - LDL 03/31/23 reviewed and was 93 - RHC/LHC 04/03/23:   Prox RCA to Mid RCA lesion is 70% stenosed.   Dist RCA lesion is 50% stenosed.   Ost LAD to Prox LAD lesion is 30% stenosed.   There is moderate left ventricular systolic dysfunction.   The left  ventricular ejection fraction is 35-45% by visual estimate.  1.  Normal right sided heart pressures (PA 25/7 (13), PCWP 6/4) 2.  Mild to moderate coronary artery disease with 70% stenosis mid RCA 3.  Moderately reduced left ventricular function with estimated LV ejection fraction 35-45%  4: DM (managed by PCP)- - A1c 01/24/24 was 10.8% - she voices frustration about her inability to get her A1c down - continue metformin  500mg  BID; may benefit from increasing this to 1000mg  BID or getting endocrinology referral; encouraged her to discuss with PCP - continue trulicity 0.75mg / week     Charlette Console, FNP 04/16/24

## 2024-04-17 ENCOUNTER — Ambulatory Visit (HOSPITAL_BASED_OUTPATIENT_CLINIC_OR_DEPARTMENT_OTHER): Payer: PRIVATE HEALTH INSURANCE | Admitting: Family

## 2024-04-17 ENCOUNTER — Telehealth (HOSPITAL_COMMUNITY): Payer: Self-pay | Admitting: Licensed Clinical Social Worker

## 2024-04-17 ENCOUNTER — Encounter: Payer: Self-pay | Admitting: Family

## 2024-04-17 ENCOUNTER — Other Ambulatory Visit
Admission: RE | Admit: 2024-04-17 | Discharge: 2024-04-17 | Disposition: A | Discharge status: Home / Self Care | Payer: PRIVATE HEALTH INSURANCE | Source: Ambulatory Visit | Attending: Family | Admitting: Family

## 2024-04-17 ENCOUNTER — Other Ambulatory Visit (HOSPITAL_COMMUNITY): Payer: Self-pay

## 2024-04-17 ENCOUNTER — Telehealth: Payer: Self-pay

## 2024-04-17 ENCOUNTER — Other Ambulatory Visit: Payer: Self-pay

## 2024-04-17 VITALS — BP 153/88 | HR 69 | Wt 165.0 lb

## 2024-04-17 DIAGNOSIS — I2510 Atherosclerotic heart disease of native coronary artery without angina pectoris: Secondary | ICD-10-CM

## 2024-04-17 DIAGNOSIS — I5022 Chronic systolic (congestive) heart failure: Secondary | ICD-10-CM | POA: Diagnosis present

## 2024-04-17 DIAGNOSIS — E1165 Type 2 diabetes mellitus with hyperglycemia: Secondary | ICD-10-CM

## 2024-04-17 DIAGNOSIS — I10 Essential (primary) hypertension: Secondary | ICD-10-CM

## 2024-04-17 LAB — LIPID PANEL
Cholesterol: 192 mg/dL (ref 0–200)
HDL: 47 mg/dL (ref >40)
LDL Cholesterol: 124 mg/dL — ABNORMAL HIGH (ref 0–99)
Total CHOL/HDL Ratio: 4.1 ratio
Triglycerides: 107 mg/dL (ref <150)
VLDL: 21 mg/dL (ref 0–40)

## 2024-04-17 LAB — BASIC METABOLIC PANEL WITH GFR
Anion gap: 8 (ref 5–15)
BUN: 13 mg/dL (ref 6–20)
CO2: 24 mmol/L (ref 22–32)
Calcium: 9 mg/dL (ref 8.9–10.3)
Chloride: 103 mmol/L (ref 98–111)
Creatinine, Ser: 1 mg/dL (ref 0.44–1.00)
GFR, Estimated: >60 mL/min (ref >60)
Glucose, Bld: 183 mg/dL — ABNORMAL HIGH (ref 70–99)
Potassium: 4.2 mmol/L (ref 3.5–5.1)
Sodium: 135 mmol/L (ref 135–145)

## 2024-04-17 MED ORDER — EMPAGLIFLOZIN 10 MG PO TABS: 10.0000 mg | ORAL_TABLET | Freq: Every day | ORAL | Status: DC

## 2024-04-17 MED ORDER — ROSUVASTATIN CALCIUM 10 MG PO TABS: 10.0000 mg | ORAL_TABLET | Freq: Every day | ORAL | 3 refills | Status: DC

## 2024-04-17 NOTE — Telephone Encounter (Signed)
 CSW consulted to discuss Medicaid application process with patient as she is struggling to afford medications and might need to prove she is not eligible for medicaid to proceed with assistance application.  CSW attempted to call- unable to reach- unable to leave message  Denton Flakes, LCSW Clinical Social Worker [facility] Desk#: [phone] Cell#: [phone]

## 2024-04-17 NOTE — Progress Notes (Addendum)
 [facility] - HEART FAILURE CLINIC - PHARMACIST COUNSELING NOTE  Jane Morrison is a 63 y.o. year old female who presents alone to the HF clinic for their one month follow-up appointment. They have a past medical history significant for T2DM, hyperlipidemia, HTN, anxiety, depression, CAD, tobacco use & chronic heart failure. Initially, they were diagnosed with HF on 03/30/2023 secondary possibly to uncontrolled hypertension (etiology unclear) with an HFrEF of 25-30%. On 01/17/2024 their Entresto  was increased to maximum target dose 97-103. At their last appointment, it was identified they were having trouble affording Jardiance . A commercial co-pay card with samples was provided last visit for both Jardiance  and Entresto . Coreg  was also increased to 25 mg during this visit for better BP control.   Current Guideline-Directed Medical Therapy (GDMT)  ACE/ARB/ARNI: Sacubitril -valsartan  97-103 mg twice daily Beta Blocker: Carvedilol  25 mg twice daily Aldosterone Antagonist: Spironolactone  25 mg daily Diuretic:  NA SGLT2i: Empagliflozin  10 mg daily  Vital Signs and Trends  Recent Trends BP Readings from Last 3 Encounters:  03/13/24 116/78  03/13/24 (!) 148/85  01/24/24 122/78   Wt Readings from Last 3 Encounters:  03/13/24 162 lb 12.8 oz (73.8 kg)  03/13/24 163 lb 4 oz (74 kg)  01/24/24 160 lb 4.8 oz (72.7 kg)   Today's visit HR 69 BP 153/88 Weight (pounds) 165 lbs  Do you check your weight daily? [] Yes [x] No  Do you check you blood pressure daily [x] Yes [] No  PTA BP: 143/60 - 50 PTA weight: haven't checked it recently and was 170 and now 163 lbs. They had some extra fluids this weekend.   Cardiac Imaging  ECHO: Date 03/30/23, EF 25-30%  RHC/LHC 04/03/23:   Prox RCA to Mid RCA lesion is 70% stenosed.   Dist RCA lesion is 50% stenosed.   Ost LAD to Prox LAD lesion is 30% stenosed.   There is moderate left ventricular systolic dysfunction.   The left ventricular  ejection fraction is 35-45% by visual estimate.  1.  Normal right sided heart pressures (PA 25/7 (13), PCWP 6/4) 2.  Mild to moderate coronary artery disease with 70% stenosis mid RCA 3.  Moderately reduced left ventricular function with estimated LV ejection fraction 35-45%  Changes Since Last Visit   Hospitalizations/ED visits (last 6 months): NA Medication changes: NA  Pertinent Labs  Lab Results  Component Value Date   NA 130 (L) 03/13/2024   CL 98 03/13/2024   K 4.4 03/13/2024   CO2 24 03/13/2024   BUN 16 03/13/2024   CREATININE 0.96 03/13/2024   GFRNONAA >60 03/13/2024   CALCIUM 9.0 03/13/2024   ALBUMIN 3.4 (L) 03/30/2023   GLUCOSE 477 (H) 03/13/2024   Lab Results  Component Value Date   LDLCALC 93 03/31/2023   Lab Results  Component Value Date   HGBA1C 10.8 (A) 01/24/2024   ASSESSMENT  Reason for today's visit   To assess current guideline directed medical therapy. They had concerns previously for affording their medications. Today they report they are had trouble picking up their medications Jardiance  and Entresto  from [facility] pharmacy. Reported they haven't taken Entresto  for 1 month and the Jardiance  they haven't been able to take for the last few weeks due to running out of samples. Will follow-up with [facility] to see how we can assist the patient in getting their medications.    Insurance:  -- Patient advocates to work through Sports [employer]: I looked into Farxiga --pt will not qualify because she has insurance. They  can have no insurance or Part D. The pt selected a high deductible plan. Jardiance  with BI Cares pt may be denied if they have not applied for Medicaid first. So we need to sign the patient up for Medicaid first and she be denied extra help.  Trudy Fusi: if her income is in the range, we could definitely try that (apply for medicaid). If she makes too much we may just want to see if we can get her some samples and I will try to apply for [employer]  [employer]. I can't promise an approval since she has Nurse, learning disability, but I'm working on two other similar ones now so if I can get approvals, I may be able to get her covered too  --  Farxiga  is $583.06 for a 30-day supply.  -- Jardiance  is $436.90 for a 30-day supply  -- Called the patient and let them know  Guideline-Directed Medical Therapy Evaluation  ACEi/ARB/ARNi Target Achieved [x] Yes [] No Safety Concerns []  Hypotension []  Hyperkalemia Other: None Identified  Patient not taking at this time - due to troubles with costs  Beta-Blocker  Target Achieved [] Yes [x] No Safety Concerns []  Bradycardia []  Hypotension []  Asthma/COPD Other: None Identified   MRA Target Achieved [x] Yes [] No Safety Concerns []  K > 5.0  []  eGFR < 30  []  Hypotension Other: None Identified   SGLT-2 Target Achieved [x] Yes [] No Safety Concerns []  eGFR < 20 []  Urinary/yeast infection Other: None Identified  Patient not taking at this time - due to troubles with costs  Loop  Patient is not on therapy because patient is not experiencing edema at this time   Heart Failure Symptoms & Volume Status   Dyspnea on exertion [] Yes [x] No  Fatigue [] Yes [x] No  Lower extremity edema [] Yes [x] No  Weight gain (> 5 lbs) [] Yes [x] No  Cough or wheezing  [] Yes [x] No  Abdominal bloating/Discomfort [] Yes [x] No  Early satiety or poor appetite [] Yes [x] No  Dizziness of lightheadedness  [] Yes [x] No  # Pillows used at night: no pillows at night   Adherence Assessment  Do you ever forget to take your medication? [] Yes [x] No  Do you ever skip doses due to side effects? [] Yes [x] No  Do you have trouble affording your medicines? [x] Yes [] No  Are you ever unable to pick up your medication due to transportation difficulties? [] Yes [x] No  Do you ever stop taking your medications because you don't believe they are helping? [] Yes [x] No   Adherence strategy:  -- Can forget to take medications at night  -- Use a pill  box  Barriers to obtaining medications: Having trouble  Did you take your medications before your appointment today: [x] Yes [] No -- They took their medications late today around 59 AM   Preventative Care   Parameter Most Recent Result Goal/Status Current Medications  LDL 93 < 70 - 55 mg/dL Above goal Statin: NA  A1c 10.8  7 - 8 % uncontrolled Metformin : 500 mg BID  Other: Tresiba  16 units daily, Trulicity 0.75 mg weekly  BP Control 153/88 < 130/80 uncontrolled Additional: NA  ECHO 25-30% 6 - 12 months NA   PLAN  Medication Interventions:  -- Caution for increased risk of UTIs due to A1c > 10 on an SGLT-2 -- Re-started Entresto  as they could pick it up at [facility] for $10 copay -- Continue to follow-up to determine if we can get coverage for Jardiance  to help with co-pay > $400 -- Continue current regimen per NP  Preventative Care Follow-up:  --  Consider addition of a high intensity statin for further LDL control  -- Internal medicine following for diabetic management  Lab or imaging orders:  -- Consider scheduling ECHO once patient is at maximum tolerated GDMT  Time spent: 15 minutes  Thank you for allowing pharmacy to participate in this patient's care.   Johnmark Geiger K Ohn Bostic, Pharm.D. Pharmacy Resident 04/17/2024 7:28 AM

## 2024-04-17 NOTE — Patient Instructions (Addendum)
 Lab Work:  Go over to the [facility]. Go pass the gift shop and have your blood work completed.  We will only call you if the results are abnormal or if the provider would like to make medication changes.   Special Instructions // Education:  Go over to the [employer] and pick up your Entresto .   Follow-Up in: 2 weeks with Shawnee Dellen, FNP.  At the [facility], you and your health needs are our priority. We have a designated team specialized in the treatment of Heart Failure. This Care Team includes your primary Heart Failure Specialized Cardiologist (physician), Advanced Practice Providers (APPs- Physician Assistants and Nurse Practitioners), and Pharmacist who all work together to provide you with the care you need, when you need it.   You may see any of the following providers on your designated Care Team at your next follow up:  Dr. Jules Oar Dr. Peder Bourdon Dr. Alwin Baars Dr. Judyth Nunnery Shawnee Dellen, FNP Bevely Brush, RPH-CPP  Please be sure to bring in all your medications bottles to every appointment.   Need to Contact Us :  If you have any questions or concerns before your next appointment please send us  a message through [place] or call our office at [phone].    TO LEAVE A MESSAGE FOR THE NURSE SELECT OPTION 2, PLEASE LEAVE A MESSAGE INCLUDING: YOUR NAME DATE OF BIRTH CALL BACK NUMBER REASON FOR CALL**this is important as we prioritize the call backs  YOU WILL RECEIVE A CALL BACK THE SAME DAY AS LONG AS YOU CALL BEFORE 4:00 PM

## 2024-04-17 NOTE — Telephone Encounter (Signed)
-----   Message from Charlette Console sent at 04/17/2024  1:11 PM EDT ----- Potassium and kidney function are normal. LDL (bad cholesterol) is too high at 124. Begin crestor 10mg  daily and will recheck lipids in a few months.

## 2024-04-17 NOTE — Telephone Encounter (Signed)
 Requested Prescriptions  Pending Prescriptions Disp Refills   metFORMIN  (GLUCOPHAGE ) 500 MG tablet [Pharmacy Med Name: METFORMIN  HCL 500 MG TABLET] 60 tablet 2    Sig: TAKE 1 TABLET BY MOUTH TWICE A DAY WITH FOOD     Endocrinology:  Diabetes - Biguanides Failed - 04/17/2024  2:02 PM      Failed - HBA1C is between 0 and 7.9 and within 180 days    Hemoglobin A1C  Date Value Ref Range Status  01/24/2024 10.8 (A) 4.0 - 5.6 % Final   Hgb A1c MFr Bld  Date Value Ref Range Status  07/26/2023 10.8 (H) <5.7 % of total Hgb Final    Comment:    For someone without known diabetes, a hemoglobin A1c value of 6.5% or greater indicates that they may have  diabetes and this should be confirmed with a follow-up  test. . For someone with known diabetes, a value <7% indicates  that their diabetes is well controlled and a value  greater than or equal to 7% indicates suboptimal  control. A1c targets should be individualized based on  duration of diabetes, age, comorbid conditions, and  other considerations. . Currently, no consensus exists regarding use of hemoglobin A1c for diagnosis of diabetes for children. .          Failed - B12 Level in normal range and within 720 days    No results found for: "VITAMINB12"       Passed - Cr in normal range and within 360 days    Creat  Date Value Ref Range Status  07/26/2023 1.18 (H) 0.50 - 1.03 mg/dL Final   Creatinine, Ser  Date Value Ref Range Status  04/17/2024 1.00 0.44 - 1.00 mg/dL Final   Creatinine, Urine  Date Value Ref Range Status  03/22/2023 105 20 - 275 mg/dL Final         Passed - eGFR in normal range and within 360 days    EGFR (African American)  Date Value Ref Range Status  11/05/2012 >60  Final   GFR calc Af Amer  Date Value Ref Range Status  07/07/2017 >60 >60 mL/min Final    Comment:    (NOTE) The eGFR has been calculated using the CKD EPI equation. This calculation has not been validated in all clinical  situations. eGFR's persistently <60 mL/min signify possible Chronic Kidney Disease.    EGFR (Non-African Amer.)  Date Value Ref Range Status  11/05/2012 >60  Final    Comment:    eGFR values <43mL/min/1.73 m2 may be an indication of chronic kidney disease (CKD). Calculated eGFR is useful in patients with stable renal function. The eGFR calculation will not be reliable in acutely ill patients when serum creatinine is changing rapidly. It is not useful in  patients on dialysis. The eGFR calculation may not be applicable to patients at the low and high extremes of body sizes, pregnant women, and vegetarians.    GFR, Estimated  Date Value Ref Range Status  04/17/2024 >60 >60 mL/min Final    Comment:    (NOTE) Calculated using the CKD-EPI Creatinine Equation (2021)    eGFR  Date Value Ref Range Status  01/17/2024 54 (L) >59 mL/min/1.73 Final         Passed - Valid encounter within last 6 months    Recent Outpatient Visits           1 month ago Uncontrolled type 2 diabetes mellitus with hyperglycemia, with long-term current use of insulin  (HCC)  [facility] Quinton Buckler, FNP   2 months ago Essential hypertension   [facility] Quinton Buckler, FNP       Future Appointments             In 1 week Quinton Buckler, FNP [facility]            Passed - CBC within normal limits and completed in the last 12 months    WBC  Date Value Ref Range Status  07/26/2023 5.6 3.8 - 10.8 Thousand/uL Final   RBC  Date Value Ref Range Status  07/26/2023 4.33 3.80 - 5.10 Million/uL Final   Hemoglobin  Date Value Ref Range Status  07/26/2023 13.0 11.7 - 15.5 g/dL Final   HGB  Date Value Ref Range Status  11/05/2012 15.3 12.0 - 16.0 g/dL Final   HCT  Date Value Ref Range Status  07/26/2023 38.5 35.0 - 45.0 % Final  11/05/2012 43.2 35.0 - 47.0 % Final   MCHC  Date Value Ref Range Status   07/26/2023 33.8 32.0 - 36.0 g/dL Final   [facility]  Date Value Ref Range Status  07/26/2023 30.0 27.0 - 33.0 pg Final   MCV  Date Value Ref Range Status  07/26/2023 88.9 80.0 - 100.0 fL Final  11/05/2012 97 80 - 100 fL Final   No results found for: "PLTCOUNTKUC", "LABPLAT", "POCPLA" RDW  Date Value Ref Range Status  07/26/2023 13.6 11.0 - 15.0 % Final  11/05/2012 13.4 11.5 - 14.5 % Final

## 2024-04-17 NOTE — Telephone Encounter (Signed)
 Requested Prescriptions  Pending Prescriptions Disp Refills   spironolactone  (ALDACTONE ) 25 MG tablet [Pharmacy Med Name: SPIRONOLACTONE  25 MG TABLET] 15 tablet 0    Sig: TAKE 1/2 TABLET BY MOUTH EVERY DAY     Cardiovascular: Diuretics - Aldosterone Antagonist Failed - 04/17/2024  4:30 PM      Failed - Last BP in normal range    BP Readings from Last 1 Encounters:  04/17/24 (!) 153/88         Passed - Cr in normal range and within 180 days    Creat  Date Value Ref Range Status  07/26/2023 1.18 (H) 0.50 - 1.03 mg/dL Final   Creatinine, Ser  Date Value Ref Range Status  04/17/2024 1.00 0.44 - 1.00 mg/dL Final   Creatinine, Urine  Date Value Ref Range Status  03/22/2023 105 20 - 275 mg/dL Final         Passed - K in normal range and within 180 days    Potassium  Date Value Ref Range Status  04/17/2024 4.2 3.5 - 5.1 mmol/L Final  11/05/2012 3.7 3.5 - 5.1 mmol/L Final         Passed - Na in normal range and within 180 days    Sodium  Date Value Ref Range Status  04/17/2024 135 135 - 145 mmol/L Final  01/17/2024 138 134 - 144 mmol/L Final  11/05/2012 135 (L) 136 - 145 mmol/L Final         Passed - eGFR is 30 or above and within 180 days    EGFR (African American)  Date Value Ref Range Status  11/05/2012 >60  Final   GFR calc Af Amer  Date Value Ref Range Status  07/07/2017 >60 >60 mL/min Final    Comment:    (NOTE) The eGFR has been calculated using the CKD EPI equation. This calculation has not been validated in all clinical situations. eGFR's persistently <60 mL/min signify possible Chronic Kidney Disease.    EGFR (Non-African Amer.)  Date Value Ref Range Status  11/05/2012 >60  Final    Comment:    eGFR values <34mL/min/1.73 m2 may be an indication of chronic kidney disease (CKD). Calculated eGFR is useful in patients with stable renal function. The eGFR calculation will not be reliable in acutely ill patients when serum creatinine is changing rapidly. It  is not useful in  patients on dialysis. The eGFR calculation may not be applicable to patients at the low and high extremes of body sizes, pregnant women, and vegetarians.    GFR, Estimated  Date Value Ref Range Status  04/17/2024 >60 >60 mL/min Final    Comment:    (NOTE) Calculated using the CKD-EPI Creatinine Equation (2021)    eGFR  Date Value Ref Range Status  01/17/2024 54 (L) >59 mL/min/1.73 Final         Passed - Valid encounter within last 6 months    Recent Outpatient Visits           1 month ago Uncontrolled type 2 diabetes mellitus with hyperglycemia, with long-term current use of insulin  [facility])   [facility] Quinton Buckler, FNP   2 months ago Essential hypertension   [facility] Quinton Buckler, FNP       Future Appointments             In 1 week Abram Hoguet, Monalisa Angles, FNP [facility], [facility]

## 2024-04-18 ENCOUNTER — Telehealth (HOSPITAL_BASED_OUTPATIENT_CLINIC_OR_DEPARTMENT_OTHER): Payer: Self-pay | Admitting: Licensed Clinical Social Worker

## 2024-04-18 NOTE — Telephone Encounter (Signed)
 H&V Care Navigation CSW Progress Note  Clinical Social Worker contacted patient by phone to f/u again on referral for Medicaid as current generic commercial plan is not affordable for her medications. Was able to leave a voicemail today at [phone]. Remain available as needed should pt return call. Will re-attempt again as able.  Patient is participating in a Managed Medicaid Plan:  No, commercial plan only  SDOH Screenings   Food Insecurity: Food Insecurity Present (01/23/2024)  Housing: Unknown (01/23/2024)  Transportation Needs: No Transportation Needs (01/23/2024)  Utilities: Not At Risk (07/26/2023)  Alcohol Screen: Low Risk  (10/31/2023)  Depression (PHQ2-9): Low Risk  (01/03/2024)  Financial Resource Strain: Low Risk  (01/23/2024)  Physical Activity: Insufficiently Active (01/23/2024)  Social Connections: Socially Integrated (01/23/2024)  Stress: No Stress Concern Present (01/23/2024)  Tobacco Use: Medium Risk (04/17/2024)  Health Literacy: Adequate Health Literacy (07/26/2023)    Nathen Balder, MSW, LCSW Clinical Social Worker II [facility] Health Heart/Vascular Care Navigation  [phone]- work cell phone (preferred)

## 2024-04-19 ENCOUNTER — Telehealth: Payer: Self-pay | Admitting: Licensed Clinical Social Worker

## 2024-04-19 NOTE — Telephone Encounter (Signed)
 H&V Care Navigation CSW Progress Note  Clinical Social Worker contacted patient by phone to  f/u again on referral for Medicaid as current generic commercial plan is not affordable for her medications. Was able to leave a voicemail again today at [phone]. Remain available as needed should pt return call. Will mail pt information about Medicaid should pt wish to see if eligible. Will provide my contact information on there as well.    Patient is participating in a Managed Medicaid Plan:  No, commercial plan only- will mail pt information about Medicaid   SDOH Screenings   Food Insecurity: Food Insecurity Present (01/23/2024)  Housing: Unknown (01/23/2024)  Transportation Needs: No Transportation Needs (01/23/2024)  Utilities: Not At Risk (07/26/2023)  Alcohol Screen: Low Risk  (10/31/2023)  Depression (PHQ2-9): Low Risk  (01/03/2024)  Financial Resource Strain: Low Risk  (01/23/2024)  Physical Activity: Insufficiently Active (01/23/2024)  Social Connections: Socially Integrated (01/23/2024)  Stress: No Stress Concern Present (01/23/2024)  Tobacco Use: Medium Risk (04/17/2024)  Health Literacy: Adequate Health Literacy (07/26/2023)    Nathen Balder, MSW, LCSW Clinical Social Worker II [facility] Health Heart/Vascular Care Navigation  [phone]- work cell phone (preferred)

## 2024-04-24 ENCOUNTER — Ambulatory Visit: Payer: PRIVATE HEALTH INSURANCE | Admitting: Nurse Practitioner

## 2024-04-24 ENCOUNTER — Encounter: Payer: Self-pay | Admitting: Nurse Practitioner

## 2024-04-24 ENCOUNTER — Other Ambulatory Visit: Payer: Self-pay

## 2024-04-24 ENCOUNTER — Ambulatory Visit: Payer: 59 | Admitting: Nurse Practitioner

## 2024-04-24 ENCOUNTER — Telehealth: Payer: Self-pay | Admitting: Licensed Clinical Social Worker

## 2024-04-24 VITALS — BP 122/78 | HR 70 | Temp 97.5°F | Wt 161.3 lb

## 2024-04-24 DIAGNOSIS — Z87891 Personal history of nicotine dependence: Secondary | ICD-10-CM

## 2024-04-24 DIAGNOSIS — E1165 Type 2 diabetes mellitus with hyperglycemia: Secondary | ICD-10-CM | POA: Diagnosis not present

## 2024-04-24 DIAGNOSIS — Z122 Encounter for screening for malignant neoplasm of respiratory organs: Secondary | ICD-10-CM

## 2024-04-24 DIAGNOSIS — I5022 Chronic systolic (congestive) heart failure: Secondary | ICD-10-CM

## 2024-04-24 DIAGNOSIS — E782 Mixed hyperlipidemia: Secondary | ICD-10-CM

## 2024-04-24 DIAGNOSIS — F17200 Nicotine dependence, unspecified, uncomplicated: Secondary | ICD-10-CM

## 2024-04-24 DIAGNOSIS — R911 Solitary pulmonary nodule: Secondary | ICD-10-CM

## 2024-04-24 DIAGNOSIS — I10 Essential (primary) hypertension: Secondary | ICD-10-CM

## 2024-04-24 DIAGNOSIS — Z794 Long term (current) use of insulin: Secondary | ICD-10-CM | POA: Diagnosis not present

## 2024-04-24 LAB — POCT GLYCOSYLATED HEMOGLOBIN (HGB A1C): Hemoglobin A1C: 10.2 % — AB (ref 4.0–5.6)

## 2024-04-24 NOTE — Progress Notes (Signed)
 BP 122/78   Pulse 70   Temp (!) 97.5 F (36.4 C) (Oral)   Wt 161 lb 4.8 oz (73.2 kg)   SpO2 100%   BMI 30.48 kg/m    Subjective:    Patient ID: Alonzo January, female    DOB: [birth year], 63 y.o.   MRN: [id number]  HPI: SHIRL FINNICUM is a 63 y.o. female  Chief Complaint  Patient presents with   Medical Management of Chronic Issues    Discussed the use of AI scribe software for clinical note transcription with the patient, who gave verbal consent to proceed.  History of Present Illness KRISHANA HERSH is a 63 year old female with type 2 diabetes who presents for diabetes management.  She has a history of heart failure, hypertension, uncontrolled type 2 diabetes, hyperlipidemia, and tobacco dependence. Her current medications include aspirin  81 mg daily, carvedilol  25 mg twice daily, Trulicity 1.5 mg weekly, Jardiance  10 mg daily, Tresiba  insulin  16 units daily, metformin  500 mg twice daily, rosuvastatin  10 mg daily, Entresto  97/103 mg twice daily, and spironolactone  12.5 mg daily.  Her primary concern today is the management of her diabetes, as her A1c remains elevated at 10.2. Blood sugar levels have been high at home. She is currently administering 16 units of Tresiba  insulin  daily and is on metformin  and Trulicity as part of her diabetes regimen.  She is overdue for her lung cancer screening. There are no recent symptoms or changes in her health status related to her other chronic conditions. She confirms having all her medications and no issues with her current prescriptions.         04/24/2024   10:00 AM 01/03/2024    9:57 AM 10/31/2023    7:48 AM  Depression screen PHQ 2/9  Decreased Interest 0 0 0  Down, Depressed, Hopeless 0 0 0  PHQ - 2 Score 0 0 0  Altered sleeping 0    Tired, decreased energy 0    Change in appetite 0    Feeling bad or failure about yourself  0    Trouble concentrating 0    Moving slowly or fidgety/restless 0    Suicidal thoughts 0     PHQ-9 Score 0    Difficult doing work/chores Not difficult at all      Relevant past medical, surgical, family and social history reviewed and updated as indicated. Interim medical history since our last visit reviewed. Allergies and medications reviewed and updated.  Review of Systems  Constitutional: Negative for fever or weight change.  Respiratory: Negative for cough and shortness of breath.   Cardiovascular: Negative for chest pain or palpitations.  Gastrointestinal: Negative for abdominal pain, no bowel changes.  Musculoskeletal: Negative for gait problem or joint swelling.  Skin: Negative for rash.  Neurological: Negative for dizziness or headache.  No other specific complaints in a complete review of systems (except as listed in HPI above).      Objective:      BP 122/78   Pulse 70   Temp (!) 97.5 F (36.4 C) (Oral)   Wt 161 lb 4.8 oz (73.2 kg)   SpO2 100%   BMI 30.48 kg/m    Wt Readings from Last 3 Encounters:  04/24/24 161 lb 4.8 oz (73.2 kg)  04/17/24 165 lb (74.8 kg)  03/13/24 162 lb 12.8 oz (73.8 kg)    Physical Exam Vitals reviewed.  Constitutional:      Appearance: Normal appearance.  HENT:  Head: Normocephalic.  Cardiovascular:     Rate and Rhythm: Normal rate and regular rhythm.  Pulmonary:     Effort: Pulmonary effort is normal.     Breath sounds: Normal breath sounds.  Musculoskeletal:        General: Normal range of motion.  Skin:    General: Skin is warm and dry.  Neurological:     General: No focal deficit present.     Mental Status: She is alert and oriented to person, place, and time. Mental status is at baseline.  Psychiatric:        Mood and Affect: Mood normal.        Behavior: Behavior normal.        Thought Content: Thought content normal.        Judgment: Judgment normal.    Physical Exam    Results for orders placed or performed in visit on 04/24/24  POCT HgB A1C   Collection Time: 04/24/24 10:01 AM  Result Value  Ref Range   Hemoglobin A1C 10.2 (A) 4.0 - 5.6 %   HbA1c POC (<> result, manual entry)     HbA1c, POC (prediabetic range)     HbA1c, POC (controlled diabetic range)            Assessment & Plan:   Problem List Items Addressed This Visit       Cardiovascular and Mediastinum   Essential hypertension   Chronic HFrEF (heart failure with reduced ejection fraction) (HCC) - Primary     Endocrine   Uncontrolled type 2 diabetes mellitus with hyperglycemia, with long-term current use of insulin  (HCC)   Relevant Orders   POCT HgB A1C (Completed)   Microalbumin / creatinine urine ratio     Other   Tobacco dependence   Relevant Orders   Ambulatory Referral Lung Cancer Screening [facility]   Mixed hyperlipidemia     Assessment and Plan Assessment & Plan Type 2 diabetes mellitus with hyperglycemia Persistent hyperglycemia with A1c at 10.2, indicating poor glycemic control. Current regimen includes Tresiba  insulin , metformin , Trulicity, and Jardiance . Insulin  dose is 16 units daily with reports of high blood glucose readings at home. - Increase Tresiba  insulin  by 2 units every Third day if blood glucose is greater than 150 mg/dL, starting with 18 units. Continue to increase by 2 units every third day if blood glucose reaches goal of  150 mg/dL. - Send a message next Wednesday with current insulin  dose and blood glucose readings. - Ensure she has all necessary medications. -referral was placed to endocrinology but she has not heard from them yet  Heart failure/HTN/HLD Continue taking asa 81 mg daily, carvedilol  25 mg two times  a day, jardiance  10 mg daily, rosuvastatin  10 mg daily, entresto  two times a day, spironolactone  12.5 mg daily.         Follow up plan: Return in about 3 months (around 07/25/2024) for follow up.

## 2024-04-24 NOTE — Progress Notes (Deleted)
   There were no vitals taken for this visit.   Subjective:    Patient ID: MARVENE LASHWAY, female    DOB: December 14, 1960, 63 y.o.   MRN: [id number]  HPI: KELLIS FLOREY is a 63 y.o. female  No chief complaint on file.   Discussed the use of AI scribe software for clinical note transcription with the patient, who gave verbal consent to proceed.  History of Present Illness          01/03/2024    9:57 AM 10/31/2023    7:48 AM 10/25/2023   10:16 AM  Depression screen PHQ 2/9  Decreased Interest 0 0 0  Down, Depressed, Hopeless 0 0 0  PHQ - 2 Score 0 0 0    Relevant past medical, surgical, family and social history reviewed and updated as indicated. Interim medical history since our last visit reviewed. Allergies and medications reviewed and updated.  Review of Systems  Per HPI unless specifically indicated above     Objective:      There were no vitals taken for this visit.  {Vitals History (Optional):23777} Wt Readings from Last 3 Encounters:  04/17/24 165 lb (74.8 kg)  03/13/24 162 lb 12.8 oz (73.8 kg)  03/13/24 163 lb 4 oz (74 kg)    Physical Exam Physical Exam    Results for orders placed or performed during the hospital encounter of 04/17/24  Lipid panel   Collection Time: 04/17/24 10:54 AM  Result Value Ref Range   Cholesterol 192 0 - 200 mg/dL   Triglycerides 244 <010 mg/dL   HDL 47 >27 mg/dL   Total CHOL/HDL Ratio 4.1 RATIO   VLDL 21 0 - 40 mg/dL   LDL Cholesterol 253 (H) 0 - 99 mg/dL  Basic metabolic panel   Collection Time: 04/17/24 10:54 AM  Result Value Ref Range   Sodium 135 135 - 145 mmol/L   Potassium 4.2 3.5 - 5.1 mmol/L   Chloride 103 98 - 111 mmol/L   CO2 24 22 - 32 mmol/L   Glucose, Bld 183 (H) 70 - 99 mg/dL   BUN 13 6 - 20 mg/dL   Creatinine, Ser 6.64 0.44 - 1.00 mg/dL   Calcium  9.0 8.9 - 10.3 mg/dL   GFR, Estimated >40 >34 mL/min   Anion gap 8 5 - 15   {Labs (Optional):23779}       Assessment & Plan:   Problem List  Items Addressed This Visit   None    Assessment and Plan Assessment & Plan         Follow up plan: No follow-ups on file.

## 2024-04-24 NOTE — Telephone Encounter (Signed)
 H&V Care Navigation CSW Progress Note  Clinical Social Worker contacted patient by phone to  f/u again on referral for Medicaid as current generic commercial plan is not affordable for her medications. Was able to leave a voicemail again today at [phone].  Pt returned my call- see full assessment note.   Patient is participating in a Managed Medicaid Plan:  Engineer, building services  SDOH Screenings   Food Insecurity: Food Insecurity Present (01/23/2024)  Housing: Unknown (01/23/2024)  Transportation Needs: No Transportation Needs (01/23/2024)  Utilities: Not At Risk (07/26/2023)  Alcohol Screen: Low Risk  (10/31/2023)  Depression (PHQ2-9): Low Risk  (04/24/2024)  Financial Resource Strain: Low Risk  (01/23/2024)  Physical Activity: Insufficiently Active (01/23/2024)  Social Connections: Socially Integrated (01/23/2024)  Stress: No Stress Concern Present (01/23/2024)  Tobacco Use: Medium Risk (04/24/2024)  Health Literacy: Adequate Health Literacy (07/26/2023)    Nathen Balder, MSW, LCSW Clinical Social Worker II [facility] Health Heart/Vascular Care Navigation  [phone]- work cell phone (preferred)

## 2024-04-24 NOTE — Patient Instructions (Signed)
 Tresiba  currently taking 16 units daily, increase by 2 units every 3rd day if blood sugar greater than 150.   Example 04/24/2024 did 16 units 04/25/2024  do 18 units 04/26/2024 do 18 units 04/27/2024 check sugar if greater than 150 increase by 2 units which would be 20 units.    And continue on.Jane AasAaron AasAaron Morrison

## 2024-04-25 ENCOUNTER — Ambulatory Visit: Payer: Self-pay | Admitting: Nurse Practitioner

## 2024-04-25 LAB — MICROALBUMIN / CREATININE URINE RATIO
Creatinine, Urine: 101 mg/dL (ref 20–275)
Microalb Creat Ratio: 12 mg/g{creat} (ref <30)
Microalb, Ur: 1.2 mg/dL

## 2024-04-30 ENCOUNTER — Telehealth: Payer: Self-pay | Admitting: Family

## 2024-04-30 NOTE — Progress Notes (Signed)
 Advanced Heart Failure Clinic Note    PCP: Quinton Buckler, FNP (last seen 05/25) Cardiologist: Percival Brace, MD (last seen during 04/24 admission)  Chief Complaint: shortness of breath   HPI:  Jane Morrison is a 63 y/o female with a history of DM, hyperlipidemia, HTN, anxiety, depression, CAD, tobacco use & chronic heart failure (newly diagnosed)  Admitted 03/30/23 due to acute onset of worsening dyspnea, associated orthopnea, chest pressure and cough productive of clear sputum as well as wheezing. Diuresed with IV lasix . NTG drip started and cardiology consulted. Initially needed bipap due to hypoxia but able to be weaned off to room air. Echo 03/30/23: EF 25-30% with mild/ moderate LVH consistent with Grade I DD and mild MR.   RHC/LHC 04/03/23:   Prox RCA to Mid RCA lesion is 70% stenosed.   Dist RCA lesion is 50% stenosed.   Ost LAD to Prox LAD lesion is 30% stenosed.   There is moderate left ventricular systolic dysfunction.   The left ventricular ejection fraction is 35-45% by visual estimate.  1.  Normal right sided heart pressures (PA 25/7 (13), PCWP 6/4) 2.  Mild to moderate coronary artery disease with 70% stenosis mid RCA 3.  Moderately reduced left ventricular function with estimated LV ejection fraction 35-45%  Was in the ED 04/27/23 after a mechanical fall after she tripped while at work at [employer]. Hit her head on the ground but no LOC. Negative imaging.   Seen in the HF clinic 04/25 and carvedilol  was increased to 25mg  BID and jardiance  was resumed.   Seen in [facility] 2 weeks ago and jardiance / entresto  were both resumed. Crestor  10mg  was started after getting lab results back.   She presents today for a HF follow-up visit with a chief complaint of minimal shortness of breath. Has rare palpitations, dizziness. Denies chest pain, abdominal distention, pedal edema or difficulty sleeping. Has upcoming cardiology appt at [facility] next week.   Hasn't smoked in ~ 1  year (April 2024).   ROS: All systems negative except as listed in HPI, PMH and Problem List.  SH:  Social History   Socioeconomic History   Marital status: Married    Spouse name: Not on file   Number of children: 3   Years of education: Not on file   Highest education level: Associate degree: academic program  Occupational History   Not on file  Tobacco Use   Smoking status: Former    Current packs/day: 0.00    Average packs/day: 0.5 packs/day for 40.0 years (20.0 ttl pk-yrs)    Types: Cigarettes    Start date: 03/29/1983    Quit date: 03/29/2023    Years since quitting: 1.0    Passive exposure: Past   Smokeless tobacco: Never  Vaping Use   Vaping status: Never Used  Substance and Sexual Activity   Alcohol use: No   Drug use: No   Sexual activity: Yes  Other Topics Concern   Not on file  Social History Narrative   3 biological kids 4 stepchildren   Social Drivers of Health   Financial Resource Strain: Low Risk  (01/23/2024)   Overall Financial Resource Strain (CARDIA)    Difficulty of Paying Living Expenses: Not very hard  Food Insecurity: Food Insecurity Present (01/23/2024)   Hunger Vital Sign    Worried About Running Out of Food in the Last Year: Never true    Ran Out of Food in the Last Year: Sometimes true  Transportation Needs: No  Transportation Needs (01/23/2024)   PRAPARE - Administrator, Civil Service (Medical): No    Lack of Transportation (Non-Medical): No  Physical Activity: Insufficiently Active (01/23/2024)   Exercise Vital Sign    Days of Exercise per Week: 2 days    Minutes of Exercise per Session: 10 min  Stress: No Stress Concern Present (01/23/2024)   [employer] of Occupational Health - Occupational Stress Questionnaire    Feeling of Stress : Only a little  Social Connections: Socially Integrated (01/23/2024)   Social Connection and Isolation Panel [NHANES]    Frequency of Communication with Friends and Family: More than three  times a week    Frequency of Social Gatherings with Friends and Family: More than three times a week    Attends Religious Services: 1 to 4 times per year    Active Member of [employer] or Organizations: Yes    Attends Banker Meetings: 1 to 4 times per year    Marital Status: Married  Catering manager Violence: Not At Risk (07/26/2023)   Humiliation, Afraid, Rape, and Kick questionnaire    Fear of Current or Ex-Partner: No    Emotionally Abused: No    Physically Abused: No    Sexually Abused: No    FH:  Family History  Problem Relation Age of Onset   Hypertension Mother    Diabetes Mother    Hypertension Father    Diabetes Father    Breast cancer Sister 53    Past Medical History:  Diagnosis Date   Anxiety    CHF (congestive heart failure) (HCC)    Depression    Diabetes mellitus without complication (HCC)    Hyperlipidemia    Hypertension     Current Outpatient Medications  Medication Sig Dispense Refill   aspirin  81 MG chewable tablet Chew 1 tablet (81 mg total) by mouth daily. 90 tablet 0   blood glucose meter kit and supplies KIT Dispense based on patient and insurance preference. Use up to four times daily as directed. (FOR ICD-9 250.00, 250.01). 1 each 0   Blood Glucose Monitoring Suppl (ONETOUCH VERIO FLEX SYSTEM) w/Device KIT Use to check blood sugar up to four times daily as directed 1 kit 0   carvedilol  (COREG ) 25 MG tablet Take 1 tablet (25 mg total) by mouth 2 (two) times daily. 180 tablet 3   Dulaglutide (TRULICITY) 1.5 MG/0.5ML SOAJ Inject 1.5 mg into the skin once a week. 6 mL 0   empagliflozin  (JARDIANCE ) 10 MG TABS tablet Take 1 tablet (10 mg total) by mouth daily before breakfast.     Glucagon  (GVOKE HYPOPEN  1-PACK) 0.5 MG/0.1ML SOAJ Inject 0.5 mg into the skin once as needed for up to 1 dose (for hypoglycemia). 0.1 mL 3   glucose blood (ONETOUCH VERIO) test strip Use to check blood sugar up to four times daily as directed 400 each 3   insulin   degludec (TRESIBA  FLEXTOUCH) 100 UNIT/ML FlexTouch Pen Inject 16 Units into the skin daily.     Insulin  Pen Needle (BD PEN NEEDLE MICRO U/F) 32G X 6 MM MISC Use to administer insulin  100 each 5   Lancets (ONETOUCH DELICA PLUS LANCET30G) MISC Use to check blood sugar up to four times daily as directed 400 each 3   metFORMIN  (GLUCOPHAGE ) 500 MG tablet TAKE 1 TABLET BY MOUTH TWICE A DAY WITH FOOD 60 tablet 2   rosuvastatin  (CRESTOR ) 10 MG tablet Take 1 tablet (10 mg total) by mouth daily. 90 tablet  3   sacubitril -valsartan  (ENTRESTO ) 97-103 MG Take 1 tablet by mouth 2 (two) times daily. 60 tablet 11   spironolactone  (ALDACTONE ) 25 MG tablet TAKE 1/2 TABLET BY MOUTH EVERY DAY 15 tablet 0   No current facility-administered medications for this visit.   Vitals:   05/01/24 0922  BP: 110/77  Pulse: 64  SpO2: 97%  Weight: 163 lb (73.9 kg)   Wt Readings from Last 3 Encounters:  05/01/24 163 lb (73.9 kg)  04/24/24 161 lb 4.8 oz (73.2 kg)  04/17/24 165 lb (74.8 kg)   Lab Results  Component Value Date   CREATININE 1.00 04/17/2024   CREATININE 0.96 03/13/2024   CREATININE 1.16 (H) 01/17/2024    PHYSICAL EXAM:  General: Well appearing. No resp difficulty HEENT: normal Neck: supple, no JVD Cor: Regular rhythm, rate. No rubs, gallops or murmurs Lungs: clear Abdomen: soft, nontender, nondistended. Extremities: no cyanosis, clubbing, rash, edema Neuro: alert & oriented X 3. Moves all 4 extremities w/o difficulty. Affect pleasant   ECG: not done   ASSESSMENT & PLAN:  1: NICM with reduced ejection fraction- - etiology likely HTN/ uncontrolled DM - NYHA class II - euvolemic - weighing daily; reminded to call for an overnight weight gain of > 2 pounds or a weekly weight gain of >5 pounds - weight down 2 pounds from last visit here 2 weeks ago - Echo 03/30/23: EF 25-30% with mild/ moderate LVH consistent with Grade I DD and mild MR.  - will get echo updated once GDMT is stabilized and  optimized - continue carvedilol  25mg  BID - continue jardiance  10mg  daily; 2 week samples provided. Explained that she needed to get denied for medicaid and then we could apply for patient assistance, otherwise, she will have to come off the medication - continue entresto  97/103mg  BID - continue spironolactone  25mg  daily - BNP 03/30/23 was 471.0  2: HTN- - BP 110/77 - saw PCP Abram Hoguet) 05/25 - BMP 04/17/24 reviewed: sodium 135, potassium 4.2, creatinine 1.00 & GFR >60  3: CAD- - saw cardiology (Paraschos) during admission 04/24; she says that she has upcoming appt next week - LDL 04/17/24 reviewed and was 124 - continue rosuvastatin  10mg  daily - RHC/LHC 04/03/23:   Prox RCA to Mid RCA lesion is 70% stenosed.   Dist RCA lesion is 50% stenosed.   Ost LAD to Prox LAD lesion is 30% stenosed.   There is moderate left ventricular systolic dysfunction.   The left ventricular ejection fraction is 35-45% by visual estimate.  1.  Normal right sided heart pressures (PA 25/7 (13), PCWP 6/4) 2.  Mild to moderate coronary artery disease with 70% stenosis mid RCA 3.  Moderately reduced left ventricular function with estimated LV ejection fraction 35-45%  4: DM (managed by PCP)- - A1c 04/24/24 was 10.2% - continue metformin  500mg  BID - awaiting endocrinology appt (referral made by PCP) - continue trulicity weekly   Return in 3 months, sooner if needed.   Charlette Console, FNP 04/30/24

## 2024-04-30 NOTE — Telephone Encounter (Signed)
 Called to confirm/remind patient of their appointment at the [facility] on 05/01/24.   Appointment:   [x] Confirmed  [] Left mess   [] No answer/No voice mail  [] VM Full/unable to leave message  [] Phone not in service  Patient reminded to bring all medications and/or complete list.  Confirmed patient has transportation. Gave directions, instructed to utilize valet parking.

## 2024-05-01 ENCOUNTER — Ambulatory Visit: Payer: PRIVATE HEALTH INSURANCE | Attending: Family | Admitting: Family

## 2024-05-01 ENCOUNTER — Encounter: Payer: Self-pay | Admitting: Family

## 2024-05-01 ENCOUNTER — Ambulatory Visit: Admission: RE | Admit: 2024-05-01 | Payer: PRIVATE HEALTH INSURANCE | Source: Ambulatory Visit

## 2024-05-01 VITALS — BP 110/77 | HR 64 | Wt 163.0 lb

## 2024-05-01 DIAGNOSIS — E1165 Type 2 diabetes mellitus with hyperglycemia: Secondary | ICD-10-CM | POA: Diagnosis not present

## 2024-05-01 DIAGNOSIS — Z8249 Family history of ischemic heart disease and other diseases of the circulatory system: Secondary | ICD-10-CM | POA: Diagnosis not present

## 2024-05-01 DIAGNOSIS — E785 Hyperlipidemia, unspecified: Secondary | ICD-10-CM | POA: Diagnosis not present

## 2024-05-01 DIAGNOSIS — E119 Type 2 diabetes mellitus without complications: Secondary | ICD-10-CM | POA: Diagnosis not present

## 2024-05-01 DIAGNOSIS — Z7984 Long term (current) use of oral hypoglycemic drugs: Secondary | ICD-10-CM | POA: Diagnosis not present

## 2024-05-01 DIAGNOSIS — I509 Heart failure, unspecified: Secondary | ICD-10-CM | POA: Insufficient documentation

## 2024-05-01 DIAGNOSIS — I10 Essential (primary) hypertension: Secondary | ICD-10-CM | POA: Diagnosis not present

## 2024-05-01 DIAGNOSIS — Z833 Family history of diabetes mellitus: Secondary | ICD-10-CM | POA: Diagnosis not present

## 2024-05-01 DIAGNOSIS — Z794 Long term (current) use of insulin: Secondary | ICD-10-CM | POA: Insufficient documentation

## 2024-05-01 DIAGNOSIS — I2510 Atherosclerotic heart disease of native coronary artery without angina pectoris: Secondary | ICD-10-CM | POA: Insufficient documentation

## 2024-05-01 DIAGNOSIS — Z7985 Long-term (current) use of injectable non-insulin antidiabetic drugs: Secondary | ICD-10-CM | POA: Diagnosis not present

## 2024-05-01 DIAGNOSIS — I428 Other cardiomyopathies: Secondary | ICD-10-CM | POA: Diagnosis not present

## 2024-05-01 DIAGNOSIS — I110 Hypertensive heart disease with heart failure: Secondary | ICD-10-CM | POA: Diagnosis not present

## 2024-05-01 DIAGNOSIS — I5022 Chronic systolic (congestive) heart failure: Secondary | ICD-10-CM

## 2024-05-01 DIAGNOSIS — Z79899 Other long term (current) drug therapy: Secondary | ICD-10-CM | POA: Insufficient documentation

## 2024-05-01 NOTE — Patient Instructions (Signed)
 It was good to see you today!  If you receive a satisfaction survey regarding the [facility] Failure Clinic, please take the time to fill it out. This way we can continue to provide excellent care and make any changes that need to be made.

## 2024-05-01 NOTE — Progress Notes (Signed)
 [facility] - HEART FAILURE CLINIC - PHARMACIST COUNSELING NOTE  Jane Morrison is a 63 y.o. year old female who presents to the HF clinic for a 2 week follow-up. They have a past medical history significant for  T2DM, hyperlipidemia, HTN, anxiety, depression, CAD, tobacco use & chronic heart failure. Initially, they were diagnosed with HF on 03/30/2023 secondary possibly to uncontrolled hypertension (etiology unclear) with an HFrEF of 25-30%. On 01/17/2024 their Entresto  was increased to maximum target dose 97-103.It was identified they were having trouble affording Jardiance . A commercial co-pay card with samples was provided last visit for both Jardiance  and Entresto . Patient reported it is still > $400 for their Jardiance  even with a co-pay at their last appointment 04/18/2023. Worked with social workers to help patient apply to medicaid since denial may open up other opportunities for them to receive support for coverage of this medication. Still an ongoing process. Patient's LDL came back elevated so she was started post appointment on Crestor  10 mg daily and will check lipids in the next 3 months.   Current Guideline-Directed Medical Therapy (GDMT)  ACE/ARB/ARNI: Sacubitril -valsartan  97-103 mg twice daily Beta Blocker: Carvedilol  25 mg twice daily Aldosterone Antagonist: Spironolactone  25 mg daily Diuretic: NA SGLT2i: NA - see notes above  Vital Signs and Trends  Recent Trends BP Readings from Last 3 Encounters:  04/24/24 122/78  04/17/24 (!) 153/88  03/13/24 116/78   Wt Readings from Last 3 Encounters:  04/24/24 161 lb 4.8 oz (73.2 kg)  04/17/24 165 lb (74.8 kg)  03/13/24 162 lb 12.8 oz (73.8 kg)   Today's visit HR 64 BP 110/77 Weight (pounds) 163 lbs   Do you check your weight daily? [x] Yes [] No  Do you check you blood pressure daily [x] Yes [] No  PTA 160 - 163 PTA: 110s - 120s is about when they live and that hasn't changed   Cardiac Imaging  ECHO:  Date 03/30/23, EF 25-30%   Changes Since Last Visit   Hospitalizations/ED visits (last 6 months): NA Medication changes: NA  Pertinent Labs  Lab Results  Component Value Date   NA 135 04/17/2024   CL 103 04/17/2024   K 4.2 04/17/2024   CO2 24 04/17/2024   BUN 13 04/17/2024   CREATININE 1.00 04/17/2024   GFRNONAA >60 04/17/2024   CALCIUM  9.0 04/17/2024   ALBUMIN 3.4 (L) 03/30/2023   GLUCOSE 183 (H) 04/17/2024   Lab Results  Component Value Date   LDLCALC 124 (H) 04/17/2024   Lab Results  Component Value Date   HGBA1C 10.2 (A) 04/24/2024    ASSESSMENT  Reason for today's visit   Patient presents alone/with family to assess current guideline directed medical therapy. This is a routine follow-up talked about diet for helping to decrease their LDL through medication and diet adjustments. Follow-up labs in the next 3 months. Gave then another two weeks of samples. If insurance is unable to cover, then they likely will not be able to continue therapy.   Guideline-Directed Medical Therapy Evaluation  ACEi/ARB/ARNi Target Achieved [x] Yes [] No Up-titration candidate today [] Yes [x] No  Beta-Blocker  Target Achieved [] Yes [x] No Up-titration candidate today [] Yes [x] No  MRA Target Achieved [] Yes [x] No Up-titration candidate today [x] Yes [] No  SGLT-2 Patient is not on therapy because insurance is not covering the medication even with a copay card it is > $400   Loop  Patient is not on therapy because they do not need it at this time   Heart Failure Symptoms & Volume  Status   Dyspnea on exertion [] Yes [x] No  Fatigue [] Yes [x] No  Lower extremity edema [] Yes [x] No  Weight gain (> 5 lbs) [] Yes [x] No  Cough or wheezing  [] Yes [x] No  Abdominal bloating/Discomfort [] Yes [x] No  Early satiety or poor appetite [] Yes [x] No  Dizziness of lightheadedness  [] Yes [x] No  # Pillows used at night:   Adherence Assessment  Do you ever forget to take your medication?  [] Yes [x] No  Do you ever skip doses due to side effects? [] Yes [x] No  Do you have trouble affording your medicines? [] Yes [x] No  Are you ever unable to pick up your medication due to transportation difficulties? [] Yes [x] No  Do you ever stop taking your medications because you don't believe they are helping? [] Yes [x] No   Adherence strategy:  -- Pill Box Barriers to obtaining medications: -- Worked out Entresto  from last appointment, explained the rationale to apply to [facility] for rejection to then apply for manufacturer's assitance through [facility] Care for Jardiance   Did you take your medications before your appointment today: [x] Yes [] No  Preventative Care   Parameter Most Recent Result Goal/Status Current Medications  LDL 124 < 70 - 55 mg/dL Above goal Statin: Crestor  10 mg daily  A1c 10.2  7 - 8 % Above goal Endocrine following for management   BP Control 110/77 < 130/80 soft Additional: NA  ECHO 25-30% 6 - 12 months Last ECHO 03/30/2023   PLAN  Medication Interventions:  -- Continue current regimen at this time, limited to re-start Jardiance  due to coverage issues -- Continue current regimen per NP Preventative Care Follow-up:  -- Follow-up LDL in 3 months post starting statin therapy Lab or imaging orders:  -- BMET today -- Consider ECHO when patient is on stable GDMT therapy long-term   Time spent: 20 minutes  Thank you for allowing pharmacy to participate in this patient's care.   Kerryann Allaire K Jeanifer Halliday, Pharm.D. Pharmacy Resident 05/01/2024 7:47 AM

## 2024-05-03 ENCOUNTER — Telehealth: Payer: Self-pay | Admitting: Licensed Clinical Social Worker

## 2024-05-03 NOTE — Telephone Encounter (Signed)
 H&V Care Navigation CSW Progress Note  Clinical Social Worker contacted patient by phone to f/u on Medicaid referral. No answer today at [phone], had requested pt let me know when information received but at this time no call back. Left voicemail. Will re-attempt one more time as able.  Patient is participating in a Managed Medicaid Plan:  No, commercial plan only  SDOH Screenings   Food Insecurity: Food Insecurity Present (01/23/2024)  Housing: Unknown (01/23/2024)  Transportation Needs: No Transportation Needs (01/23/2024)  Utilities: Not At Risk (07/26/2023)  Alcohol Screen: Low Risk  (10/31/2023)  Depression (PHQ2-9): Low Risk  (04/24/2024)  Financial Resource Strain: Low Risk  (01/23/2024)  Physical Activity: Insufficiently Active (01/23/2024)  Social Connections: Socially Integrated (01/23/2024)  Stress: No Stress Concern Present (01/23/2024)  Tobacco Use: Medium Risk (05/01/2024)  Health Literacy: Adequate Health Literacy (07/26/2023)    Nathen Balder, MSW, LCSW Clinical Social Worker II [facility] Health Heart/Vascular Care Navigation  [phone]- work cell phone (preferred)

## 2024-05-07 ENCOUNTER — Telehealth: Payer: Self-pay | Admitting: Licensed Clinical Social Worker

## 2024-05-07 ENCOUNTER — Telehealth: Payer: Self-pay

## 2024-05-07 NOTE — Telephone Encounter (Signed)
 Patient was identified as falling into the [place] Measure - Diabetes.   Patient was: Referred to pharmacy for chronic disease management.

## 2024-05-07 NOTE — Telephone Encounter (Signed)
 H&V Care Navigation CSW Progress Note  Clinical Social Worker contacted patient by phone to f/u on Medicaid referral. No answer today at [phone], had requested pt let me know when information received but at this time no call back. Left additional voicemail this morning. Remain available should pt return call/re-engage with care navigation team.    Patient is participating in a Managed Medicaid Plan:  No, self pay only  SDOH Screenings   Food Insecurity: Food Insecurity Present (01/23/2024)  Housing: Unknown (01/23/2024)  Transportation Needs: No Transportation Needs (01/23/2024)  Utilities: Not At Risk (07/26/2023)  Alcohol Screen: Low Risk  (10/31/2023)  Depression (PHQ2-9): Low Risk  (04/24/2024)  Financial Resource Strain: Low Risk  (01/23/2024)  Physical Activity: Insufficiently Active (01/23/2024)  Social Connections: Socially Integrated (01/23/2024)  Stress: No Stress Concern Present (01/23/2024)  Tobacco Use: Medium Risk (05/01/2024)  Health Literacy: Adequate Health Literacy (07/26/2023)    Nathen Balder, MSW, LCSW Clinical Social Worker II [facility] Heart/Vascular Care Navigation  [phone]- work cell phone (preferred)

## 2024-05-08 ENCOUNTER — Telehealth: Payer: Self-pay

## 2024-05-08 NOTE — Progress Notes (Signed)
 Complex Care Management Care Guide Note  05/08/2024 Name: Jane Morrison MRN: [id number] DOB: Apr 02, 1961  Jane Morrison is a 63 y.o. year old female who is a primary care patient of Quinton Buckler, FNP and is actively engaged with the care management team. I reached out to Alonzo January by phone today to assist with re-scheduling  with the Pharmacist.  Follow up plan: Unsuccessful telephone outreach attempt made. A HIPAA compliant phone message was left for the patient providing contact information and requesting a return call.  Lenton Rail , RMA     [facility]  [facility], [facility] Guide  Direct Dial: [phone]  Website: [email or web address]

## 2024-05-11 ENCOUNTER — Other Ambulatory Visit: Payer: Self-pay | Admitting: Nurse Practitioner

## 2024-05-13 NOTE — Telephone Encounter (Signed)
 Requested medication (s) are due for refill today: yes  Requested medication (s) are on the active medication list: yes  Last refill:  05/01/24  Future visit scheduled: no  Notes to clinic:  Unable to refill per protocol, last refill by another/historical provider.      Requested Prescriptions  Pending Prescriptions Disp Refills   spironolactone  (ALDACTONE ) 25 MG tablet [Pharmacy Med Name: SPIRONOLACTONE  25 MG TABLET] 15 tablet     Sig: TAKE 1/2 TABLET BY MOUTH DAILY     Cardiovascular: Diuretics - Aldosterone Antagonist Passed - 05/13/2024  1:52 PM      Passed - Cr in normal range and within 180 days    Creat  Date Value Ref Range Status  07/26/2023 1.18 (H) 0.50 - 1.03 mg/dL Final   Creatinine, Ser  Date Value Ref Range Status  04/17/2024 1.00 0.44 - 1.00 mg/dL Final   Creatinine, Urine  Date Value Ref Range Status  04/24/2024 101 20 - 275 mg/dL Final         Passed - K in normal range and within 180 days    Potassium  Date Value Ref Range Status  04/17/2024 4.2 3.5 - 5.1 mmol/L Final  11/05/2012 3.7 3.5 - 5.1 mmol/L Final         Passed - Na in normal range and within 180 days    Sodium  Date Value Ref Range Status  04/17/2024 135 135 - 145 mmol/L Final  01/17/2024 138 134 - 144 mmol/L Final  11/05/2012 135 (L) 136 - 145 mmol/L Final         Passed - eGFR is 30 or above and within 180 days    EGFR (African American)  Date Value Ref Range Status  11/05/2012 >60  Final   GFR calc Af Amer  Date Value Ref Range Status  07/07/2017 >60 >60 mL/min Final    Comment:    (NOTE) The eGFR has been calculated using the CKD EPI equation. This calculation has not been validated in all clinical situations. eGFR's persistently <60 mL/min signify possible Chronic Kidney Disease.    EGFR (Non-African Amer.)  Date Value Ref Range Status  11/05/2012 >60  Final    Comment:    eGFR values <39mL/min/1.73 m2 may be an indication of chronic kidney disease (CKD). Calculated  eGFR is useful in patients with stable renal function. The eGFR calculation will not be reliable in acutely ill patients when serum creatinine is changing rapidly. It is not useful in  patients on dialysis. The eGFR calculation may not be applicable to patients at the low and high extremes of body sizes, pregnant women, and vegetarians.    GFR, Estimated  Date Value Ref Range Status  04/17/2024 >60 >60 mL/min Final    Comment:    (NOTE) Calculated using the CKD-EPI Creatinine Equation (2021)    eGFR  Date Value Ref Range Status  01/17/2024 54 (L) >59 mL/min/1.73 Final         Passed - Last BP in normal range    BP Readings from Last 1 Encounters:  05/01/24 110/77         Passed - Valid encounter within last 6 months    Recent Outpatient Visits           2 weeks ago Chronic HFrEF (heart failure with reduced ejection fraction) [facility])   [facility] Donny Gall F, FNP   2 months ago Uncontrolled type 2 diabetes mellitus with hyperglycemia, with long-term current use  of insulin  [facility])   [facility] Quinton Buckler, FNP   3 months ago Essential hypertension   [facility] Quinton Buckler, [place]

## 2024-05-16 NOTE — Progress Notes (Signed)
 Complex Care Management Care Guide Note  05/16/2024 Name: Jane Morrison MRN: [id number] DOB: 01-Jul-1961  Jane Morrison is a 63 y.o. year old female who is a primary care patient of Quinton Buckler, FNP and is actively engaged with the care management team. I reached out to Alonzo January by phone today to assist with re-scheduling  with the Pharmacist.  Follow up plan: Unsuccessful telephone outreach attempt made. A HIPAA compliant phone message was left for the patient providing contact information and requesting a return call.  Lenton Rail , RMA     [facility]  [facility], [facility] Guide  Direct Dial: [phone]  Website: [email or web address]

## 2024-07-03 ENCOUNTER — Encounter: Payer: Self-pay | Admitting: Acute Care

## 2024-07-06 ENCOUNTER — Other Ambulatory Visit: Payer: Self-pay | Admitting: Nurse Practitioner

## 2024-07-06 DIAGNOSIS — E1165 Type 2 diabetes mellitus with hyperglycemia: Secondary | ICD-10-CM

## 2024-07-08 NOTE — Telephone Encounter (Signed)
 Requested Prescriptions  Pending Prescriptions Disp Refills   metFORMIN  (GLUCOPHAGE ) 500 MG tablet [Pharmacy Med Name: METFORMIN  HCL 500 MG TABLET] 180 tablet 0    Sig: TAKE 1 TABLET BY MOUTH TWICE A DAY WITH FOOD     Endocrinology:  Diabetes - Biguanides Failed - 07/08/2024  2:52 PM      Failed - HBA1C is between 0 and 7.9 and within 180 days    Hemoglobin A1C  Date Value Ref Range Status  04/24/2024 10.2 (A) 4.0 - 5.6 % Final   Hgb A1c MFr Bld  Date Value Ref Range Status  07/26/2023 10.8 (H) <5.7 % of total Hgb Final    Comment:    For someone without known diabetes, a hemoglobin A1c value of 6.5% or greater indicates that they may have  diabetes and this should be confirmed with a follow-up  test. . For someone with known diabetes, a value <7% indicates  that their diabetes is well controlled and a value  greater than or equal to 7% indicates suboptimal  control. A1c targets should be individualized based on  duration of diabetes, age, comorbid conditions, and  other considerations. . Currently, no consensus exists regarding use of hemoglobin A1c for diagnosis of diabetes for children. .          Failed - B12 Level in normal range and within 720 days    No results found for: VITAMINB12       Passed - Cr in normal range and within 360 days    Creat  Date Value Ref Range Status  07/26/2023 1.18 (H) 0.50 - 1.03 mg/dL Final   Creatinine, Ser  Date Value Ref Range Status  04/17/2024 1.00 0.44 - 1.00 mg/dL Final   Creatinine, Urine  Date Value Ref Range Status  04/24/2024 101 20 - 275 mg/dL Final         Passed - eGFR in normal range and within 360 days    EGFR (African American)  Date Value Ref Range Status  11/05/2012 >60  Final   GFR calc Af Amer  Date Value Ref Range Status  07/07/2017 >60 >60 mL/min Final    Comment:    (NOTE) The eGFR has been calculated using the CKD EPI equation. This calculation has not been validated in all clinical  situations. eGFR's persistently <60 mL/min signify possible Chronic Kidney Disease.    EGFR (Non-African Amer.)  Date Value Ref Range Status  11/05/2012 >60  Final    Comment:    eGFR values <59mL/min/1.73 m2 may be an indication of chronic kidney disease (CKD). Calculated eGFR is useful in patients with stable renal function. The eGFR calculation will not be reliable in acutely ill patients when serum creatinine is changing rapidly. It is not useful in  patients on dialysis. The eGFR calculation may not be applicable to patients at the low and high extremes of body sizes, pregnant women, and vegetarians.    GFR, Estimated  Date Value Ref Range Status  04/17/2024 >60 >60 mL/min Final    Comment:    (NOTE) Calculated using the CKD-EPI Creatinine Equation (2021)    eGFR  Date Value Ref Range Status  01/17/2024 54 (L) >59 mL/min/1.73 Final         Passed - Valid encounter within last 6 months    Recent Outpatient Visits           2 months ago Chronic HFrEF (heart failure with reduced ejection fraction) [facility])   [facility] Gareth Clarity F, FNP   3 months ago Uncontrolled type 2 diabetes mellitus with hyperglycemia, with long-term current use of insulin  [facility])   [facility] Gareth Clarity FALCON, FNP   5 months ago Essential hypertension   [facility] Gareth Clarity FALCON, FNP              Passed - CBC within normal limits and completed in the last 12 months    WBC  Date Value Ref Range Status  07/26/2023 5.6 3.8 - 10.8 Thousand/uL Final   RBC  Date Value Ref Range Status  07/26/2023 4.33 3.80 - 5.10 Million/uL Final   Hemoglobin  Date Value Ref Range Status  07/26/2023 13.0 11.7 - 15.5 g/dL Final   HGB  Date Value Ref Range Status  11/05/2012 15.3 12.0 - 16.0 g/dL Final   HCT  Date Value Ref Range Status  07/26/2023 38.5 35.0 - 45.0 % Final  11/05/2012 43.2 35.0 - 47.0 % Final   MCHC   Date Value Ref Range Status  07/26/2023 33.8 32.0 - 36.0 g/dL Final   [facility]  Date Value Ref Range Status  07/26/2023 30.0 27.0 - 33.0 pg Final   MCV  Date Value Ref Range Status  07/26/2023 88.9 80.0 - 100.0 fL Final  11/05/2012 97 80 - 100 fL Final   No results found for: PLTCOUNTKUC, LABPLAT, POCPLA RDW  Date Value Ref Range Status  07/26/2023 13.6 11.0 - 15.0 % Final  11/05/2012 13.4 11.5 - 14.5 % Final

## 2024-07-10 ENCOUNTER — Ambulatory Visit: Payer: 59 | Admitting: Podiatry

## 2024-07-30 ENCOUNTER — Telehealth: Payer: Self-pay | Admitting: Family

## 2024-07-30 NOTE — Progress Notes (Unsigned)
 Advanced [facility] Failure Clinic Note    PCP: Gareth Mliss FALCON, FNP  Cardiologist: Ammon Blunt, MD (last seen during 04/24 admission)  Chief Complaint: fatigue   HPI:  Jane Morrison is a 63 y/o female with a history of DM, hyperlipidemia, HTN, anxiety, depression, CAD, tobacco use & chronic heart failure (newly diagnosed)  Admitted 03/30/23 due to acute onset of worsening dyspnea, associated orthopnea, chest pressure and cough productive of clear sputum as well as wheezing. Diuresed with IV lasix . NTG drip started and cardiology consulted. Initially needed bipap due to hypoxia but able to be weaned off to room air. Echo 03/30/23: EF 25-30% with mild/ moderate LVH consistent with Grade I DD and mild MR.   RHC/LHC 04/03/23:   Prox RCA to Mid RCA lesion is 70% stenosed.   Dist RCA lesion is 50% stenosed.   Ost LAD to Prox LAD lesion is 30% stenosed.   There is moderate left ventricular systolic dysfunction.   The left ventricular ejection fraction is 35-45% by visual estimate.  1.  Normal right sided heart pressures (PA 25/7 (13), PCWP 6/4) 2.  Mild to moderate coronary artery disease with 70% stenosis mid RCA 3.  Moderately reduced left ventricular function with estimated LV ejection fraction 35-45%  Was in the ED 04/27/23 after a mechanical fall after she tripped while at work at [employer]. Hit her head on the ground but no LOC. Negative imaging.   Seen in the HF clinic 04/25 and carvedilol  was increased to 25mg  BID and jardiance  was resumed.   Seen in [facility] 05/25 and jardiance / entresto  were both resumed. Crestor  10mg  was started after getting lab results back.   She presents today for a HF follow-up visit with a chief complaint of fatigue. Has occasional dizziness. Biggest concern is of a sensation of feeling of worms moving around rectum. She says that she thinks she has seen them in bowel movements at times.  She has been out of jardiance  since yesterday and has been out of  entresto  for last 2 days. She did bring her medicaid denial letter.     Hasn't smoked since 04/24.   ROS: All systems negative except as listed in HPI, PMH and Problem List.  SH:  Social History   Socioeconomic History   Marital status: Married    Spouse name: Not on file   Number of children: 3   Years of education: Not on file   Highest education level: Associate degree: academic program  Occupational History   Not on file  Tobacco Use   Smoking status: Former    Current packs/day: 0.00    Average packs/day: 0.5 packs/day for 40.0 years (20.0 ttl pk-yrs)    Types: Cigarettes    Start date: 03/29/1983    Quit date: 03/29/2023    Years since quitting: 1.3    Passive exposure: Past   Smokeless tobacco: Never  Vaping Use   Vaping status: Never Used  Substance and Sexual Activity   Alcohol use: No   Drug use: No   Sexual activity: Yes  Other Topics Concern   Not on file  Social History Narrative   3 biological kids 4 stepchildren   Social Drivers of Health   Financial Resource Strain: Low Risk  (01/23/2024)   Overall Financial Resource Strain (CARDIA)    Difficulty of Paying Living Expenses: Not very hard  Food Insecurity: Food Insecurity Present (01/23/2024)   Hunger Vital Sign    Worried About Programme researcher, broadcasting/film/video in  the Last Year: Never true    Ran Out of Food in the Last Year: Sometimes true  Transportation Needs: No Transportation Needs (01/23/2024)   PRAPARE - Administrator, Civil Service (Medical): No    Lack of Transportation (Non-Medical): No  Physical Activity: Insufficiently Active (01/23/2024)   Exercise Vital Sign    Days of Exercise per Week: 2 days    Minutes of Exercise per Session: 10 min  Stress: No Stress Concern Present (01/23/2024)   [employer] of Occupational Health - Occupational Stress Questionnaire    Feeling of Stress : Only a little  Social Connections: Socially Integrated (01/23/2024)   Social Connection and Isolation  Panel    Frequency of Communication with Friends and Family: More than three times a week    Frequency of Social Gatherings with Friends and Family: More than three times a week    Attends Religious Services: 1 to 4 times per year    Active Member of [employer] or Organizations: Yes    Attends Banker Meetings: 1 to 4 times per year    Marital Status: Married  Catering manager Violence: Not At Risk (07/26/2023)   Humiliation, Afraid, Rape, and Kick questionnaire    Fear of Current or Ex-Partner: No    Emotionally Abused: No    Physically Abused: No    Sexually Abused: No    FH:  Family History  Problem Relation Age of Onset   Hypertension Mother    Diabetes Mother    Hypertension Father    Diabetes Father    Breast cancer Sister 44    Past Medical History:  Diagnosis Date   Anxiety    CHF (congestive heart failure) (HCC)    Depression    Diabetes mellitus without complication (HCC)    Hyperlipidemia    Hypertension     Current Outpatient Medications  Medication Sig Dispense Refill   aspirin  81 MG chewable tablet Chew 1 tablet (81 mg total) by mouth daily. 90 tablet 0   blood glucose meter kit and supplies KIT Dispense based on patient and insurance preference. Use up to four times daily as directed. (FOR ICD-9 250.00, 250.01). 1 each 0   Blood Glucose Monitoring Suppl (ONETOUCH VERIO FLEX SYSTEM) w/Device KIT Use to check blood sugar up to four times daily as directed 1 kit 0   carvedilol  (COREG ) 25 MG tablet Take 1 tablet (25 mg total) by mouth 2 (two) times daily. 180 tablet 3   Dulaglutide  (TRULICITY ) 1.5 MG/0.5ML SOAJ Inject 1.5 mg into the skin once a week. 6 mL 0   empagliflozin  (JARDIANCE ) 10 MG TABS tablet Take 1 tablet (10 mg total) by mouth daily before breakfast. (Patient not taking: Reported on 05/01/2024)     Glucagon  (GVOKE HYPOPEN  1-PACK) 0.5 MG/0.1ML SOAJ Inject 0.5 mg into the skin once as needed for up to 1 dose (for hypoglycemia). 0.1 mL 3    glucose blood (ONETOUCH VERIO) test strip Use to check blood sugar up to four times daily as directed 400 each 3   insulin  degludec (TRESIBA  FLEXTOUCH) 100 UNIT/ML FlexTouch Pen Inject 16 Units into the skin daily.     Insulin  Pen Needle (BD PEN NEEDLE MICRO U/F) 32G X 6 MM MISC Use to administer insulin  100 each 5   Lancets (ONETOUCH DELICA PLUS LANCET30G) MISC Use to check blood sugar up to four times daily as directed 400 each 3   metFORMIN  (GLUCOPHAGE ) 500 MG tablet TAKE 1 TABLET BY  MOUTH TWICE A DAY WITH FOOD 180 tablet 0   rosuvastatin  (CRESTOR ) 10 MG tablet Take 1 tablet (10 mg total) by mouth daily. 90 tablet 3   sacubitril -valsartan  (ENTRESTO ) 97-103 MG Take 1 tablet by mouth 2 (two) times daily. 60 tablet 11   spironolactone  (ALDACTONE ) 25 MG tablet Take 25 mg by mouth daily.     No current facility-administered medications for this visit.   Vitals:   07/31/24 0849  BP: (!) 154/100  Pulse: 62  SpO2: 99%  Weight: 163 lb 3.2 oz (74 kg)   Wt Readings from Last 3 Encounters:  07/31/24 162 lb (73.5 kg)  07/31/24 163 lb 3.2 oz (74 kg)  05/01/24 163 lb (73.9 kg)   Lab Results  Component Value Date   CREATININE 0.92 07/31/2024   CREATININE 1.00 04/17/2024   CREATININE 0.96 03/13/2024    PHYSICAL EXAM:  General: Well appearing.  Cor: No JVD. Regular rhythm, rate.  Lungs: clear Abdomen: soft, nontender, nondistended. Extremities: no edema Neuro:. Affect pleasant   ECG: not done   ASSESSMENT & PLAN:  1: NICM with reduced ejection fraction- - etiology likely HTN/ uncontrolled DM - NYHA Morrison II - euvolemic - weighing daily; reminded to call for an overnight weight gain of > 2 pounds or a weekly weight gain of >5 pounds - weight stable from last visit here 3 months ago - Echo 03/30/23: EF 25-30% with mild/ moderate LVH consistent with Grade I DD and mild MR.  - will get echo updated once GDMT is stabilized and she's able to consistently take meds - continue carvedilol   25mg  BID - resume jardiance  10mg  daily. Samples provided today and RN will reach out to pharm regarding patient's medicaid denial letter - resume entresto  97/103mg  BID - continue spironolactone  25mg  daily - BNP 03/30/23 was 471.0  2: HTN- - BP 154/100. Resuming entresto  per above - saw PCP [place]) 05/25 - BMP 04/17/24 reviewed: sodium 135, potassium 4.2, creatinine 1.00 & GFR >60 - BMET today  3: CAD- - saw cardiology ([facility]as[facility]) during admission 04/24. Reached out to provider's office to get appt scheduled and they will contact patient  - LDL 04/17/24 reviewed and was 124 - RHC/LHC 04/03/23:   Prox RCA to Mid RCA lesion is 70% stenosed.   Dist RCA lesion is 50% stenosed.   Ost LAD to Prox LAD lesion is 30% stenosed.   There is moderate left ventricular systolic dysfunction.   The left ventricular ejection fraction is 35-45% by visual estimate.  1.  Normal right sided heart pressures (PA 25/7 (13), PCWP 6/4) 2.  Mild to moderate coronary artery disease with 70% stenosis mid RCA 3.  Moderately reduced left ventricular function with estimated LV ejection fraction 35-45%  4: DM (managed by PCP)- - A1c 04/24/24 was 10.2% - continue metformin  500mg  BID - awaiting endocrinology appt (referral made by PCP) - continue trulicity  weekly  5: Hyperlipidemia- - LDL 04/17/24 was 107 - lipid panel today - continue rosuvastatin  10mg  daily.   Reached out to PCP regarding her sensation of feeling worms around rectum. PCP said that we could send patient over to PCP office and they would work her in. Patient informed.    Return in here in 4-6 weeks, sooner if needed.   Jane DELENA Class, FNP 07/30/24

## 2024-07-30 NOTE — Telephone Encounter (Signed)
 Called to confirm/remind patient of their appointment at the [facility] Failure Clinic on 07/31/24.   Appointment:   [x] Confirmed  [] Left mess   [] No answer/No voice mail  [] VM Full/unable to leave message  [] Phone not in service  Patient reminded to bring all medications and/or complete list.  Confirmed patient has transportation. Gave directions, instructed to utilize valet parking.

## 2024-07-31 ENCOUNTER — Ambulatory Visit (HOSPITAL_BASED_OUTPATIENT_CLINIC_OR_DEPARTMENT_OTHER): Payer: PRIVATE HEALTH INSURANCE | Admitting: Family

## 2024-07-31 ENCOUNTER — Other Ambulatory Visit
Admission: RE | Admit: 2024-07-31 | Discharge: 2024-07-31 | Disposition: A | Discharge status: Home / Self Care | Payer: Self-pay | Source: Ambulatory Visit | Attending: Family | Admitting: Family

## 2024-07-31 ENCOUNTER — Ambulatory Visit (INDEPENDENT_AMBULATORY_CARE_PROVIDER_SITE_OTHER): Payer: Self-pay | Admitting: Nurse Practitioner

## 2024-07-31 ENCOUNTER — Telehealth: Payer: Self-pay

## 2024-07-31 ENCOUNTER — Other Ambulatory Visit: Payer: Self-pay | Admitting: Family

## 2024-07-31 ENCOUNTER — Encounter: Payer: Self-pay | Admitting: Family

## 2024-07-31 ENCOUNTER — Other Ambulatory Visit (HOSPITAL_COMMUNITY): Payer: Self-pay

## 2024-07-31 ENCOUNTER — Ambulatory Visit: Payer: Self-pay | Admitting: Family

## 2024-07-31 ENCOUNTER — Encounter: Payer: Self-pay | Admitting: Nurse Practitioner

## 2024-07-31 VITALS — BP 130/70 | HR 88 | Resp 18 | Ht 61.0 in | Wt 162.0 lb

## 2024-07-31 VITALS — BP 154/100 | HR 62 | Wt 163.2 lb

## 2024-07-31 DIAGNOSIS — E1165 Type 2 diabetes mellitus with hyperglycemia: Secondary | ICD-10-CM

## 2024-07-31 DIAGNOSIS — I2510 Atherosclerotic heart disease of native coronary artery without angina pectoris: Secondary | ICD-10-CM

## 2024-07-31 DIAGNOSIS — B80 Enterobiasis: Secondary | ICD-10-CM

## 2024-07-31 DIAGNOSIS — I5022 Chronic systolic (congestive) heart failure: Secondary | ICD-10-CM

## 2024-07-31 DIAGNOSIS — I10 Essential (primary) hypertension: Secondary | ICD-10-CM

## 2024-07-31 DIAGNOSIS — E782 Mixed hyperlipidemia: Secondary | ICD-10-CM | POA: Insufficient documentation

## 2024-07-31 LAB — BASIC METABOLIC PANEL WITH GFR
Anion gap: 10 (ref 5–15)
BUN: 12 mg/dL (ref 6–20)
CO2: 25 mmol/L (ref 22–32)
Calcium: 8.9 mg/dL (ref 8.9–10.3)
Chloride: 99 mmol/L (ref 98–111)
Creatinine, Ser: 0.92 mg/dL (ref 0.44–1.00)
GFR, Estimated: >60 mL/min (ref >60)
Glucose, Bld: 454 mg/dL — ABNORMAL HIGH (ref 70–99)
Potassium: 4 mmol/L (ref 3.5–5.1)
Sodium: 134 mmol/L — ABNORMAL LOW (ref 135–145)

## 2024-07-31 LAB — LIPID PANEL
Cholesterol: 178 mg/dL (ref 0–200)
HDL: 57 mg/dL (ref >40)
LDL Cholesterol: 101 mg/dL — ABNORMAL HIGH (ref 0–99)
Total CHOL/HDL Ratio: 3.1 ratio
Triglycerides: 101 mg/dL (ref <150)
VLDL: 20 mg/dL (ref 0–40)

## 2024-07-31 MED ORDER — MEBENDAZOLE 100 MG PO CHEW
CHEWABLE_TABLET | ORAL | 1 refills | Status: AC
Start: 2024-07-31 — End: ?

## 2024-07-31 MED ORDER — ROSUVASTATIN CALCIUM 20 MG PO TABS: 20.0000 mg | ORAL_TABLET | Freq: Every day | ORAL | Status: DC

## 2024-07-31 NOTE — Progress Notes (Addendum)
 Samples of this drug were given to the patient, quantity jardiance  10mg , Lot Number [id number], exp feb 2027   Entresto  49-51mg  (2 tabs) two times daily to make dose 97-103mg  bid. Lot# [id number]  EXP 09/2025

## 2024-07-31 NOTE — Patient Instructions (Signed)
 Please call [facility] cardiology at [phone] if you don't hear from them.  Medication Changes:  Samples given today  Lab Work:  Go over to the [facility]. Go pass the gift shop and have your blood work completed.  We will only call you if the results are abnormal or if the provider would like to make medication changes.  No news is good news.   Special Instructions // Education:  Please report to your pcp directly after this visit. They will be able to see you today.  Follow-Up in: Please follow up with the Advanced [facility] Failure Clinic in 4-6 weeks with Jane Class, FNP.   Thank you for choosing [facility].    At the Advanced Heart Failure Clinic, you and your health needs are our priority. We have a designated team specialized in the treatment of Heart Failure. This Care Team includes your primary Heart Failure Specialized Cardiologist (physician), Advanced Practice Providers (APPs- Physician Assistants and Nurse Practitioners), and Pharmacist who all work together to provide you with the care you need, when you need it.   You may see any of the following providers on your designated Care Team at your next follow up:  Dr. Toribio Fuel Dr. Ezra Shuck Dr. Ria Commander Dr. Morene Brownie Jane Class, FNP Jaun Bash, RPH-CPP  Please be sure to bring in all your medications bottles to every appointment.   Need to Contact Us :  If you have any questions or concerns before your next appointment please send us  a message through [place] or call our office at [phone].    TO LEAVE A MESSAGE FOR THE NURSE SELECT OPTION 2, PLEASE LEAVE A MESSAGE INCLUDING: YOUR NAME DATE OF BIRTH CALL BACK NUMBER REASON FOR CALL**this is important as we prioritize the call backs  YOU WILL RECEIVE A CALL BACK THE SAME DAY AS LONG AS YOU CALL BEFORE 4:00 PM

## 2024-07-31 NOTE — Telephone Encounter (Addendum)
 Pt stated that she was unable to afford jardiance  and entresto . States her income combined with her husband's is around [place]. Per pharmacy pt does not qualify for pt assistance for jardiance . Pt to stop medication per Jane Class, FNP. Pt agreeable to new plan.

## 2024-07-31 NOTE — Progress Notes (Signed)
 BP 130/70   Pulse 88   Resp 18   Ht 5' 1 (1.549 m)   Wt 162 lb (73.5 kg)   BMI 30.61 kg/m    Subjective:    Patient ID: Jane Morrison, female    DOB: 1961/03/25, 63 y.o.   MRN: [id number]  HPI: Jane Morrison is a 63 y.o. female  Chief Complaint  Patient presents with   worms    Discussed the use of AI scribe software for clinical note transcription with the patient, who gave verbal consent to proceed.  History of Present Illness Jane Morrison is a 63 year old female who presents with symptoms suggestive of a parasitic infection.  Perianal pruritus and sensation of parasites - Sensation of 'worms on the inside,' particularly around the rectal area, present for approximately one month - Associated perianal itching - Sensation sometimes extends to the gluteal cleft after using the bathroom - No visual confirmation of worms - No evidence of worms found in underwear          04/24/2024   10:00 AM 01/03/2024    9:57 AM 10/31/2023    7:48 AM  Depression screen PHQ 2/9  Decreased Interest 0 0 0  Down, Depressed, Hopeless 0 0 0  PHQ - 2 Score 0 0 0  Altered sleeping 0    Tired, decreased energy 0    Change in appetite 0    Feeling bad or failure about yourself  0    Trouble concentrating 0    Moving slowly or fidgety/restless 0    Suicidal thoughts 0    PHQ-9 Score 0    Difficult doing work/chores Not difficult at all      Relevant past medical, surgical, family and social history reviewed and updated as indicated. Interim medical history since our last visit reviewed. Allergies and medications reviewed and updated.  Review of Systems  Ten systems reviewed and is negative except as mentioned in HPI      Objective:     BP 130/70   Pulse 88   Resp 18   Ht 5' 1 (1.549 m)   Wt 162 lb (73.5 kg)   BMI 30.61 kg/m    Wt Readings from Last 3 Encounters:  07/31/24 162 lb (73.5 kg)  07/31/24 163 lb 3.2 oz (74 kg)  05/01/24 163 lb (73.9 kg)     Physical Exam Physical Exam GENERAL: Alert, cooperative, well developed, no acute distress HEENT: Normocephalic, normal oropharynx, moist mucous membranes CHEST: Clear to auscultation bilaterally, No wheezes, rhonchi, or crackles CARDIOVASCULAR: Normal heart rate and rhythm, S1 and S2 normal without murmurs ABDOMEN: Soft, non-tender, non-distended, without organomegaly, Normal bowel sounds EXTREMITIES: No cyanosis or edema NEUROLOGICAL: Cranial nerves grossly intact, Moves all extremities without gross motor or sensory deficit   Results for orders placed or performed during the hospital encounter of 07/31/24  Basic metabolic panel with GFR   Collection Time: 07/31/24  9:59 AM  Result Value Ref Range   Sodium 134 (L) 135 - 145 mmol/L   Potassium 4.0 3.5 - 5.1 mmol/L   Chloride 99 98 - 111 mmol/L   CO2 25 22 - 32 mmol/L   Glucose, Bld 454 (H) 70 - 99 mg/dL   BUN 12 6 - 20 mg/dL   Creatinine, Ser 9.07 0.44 - 1.00 mg/dL   Calcium  8.9 8.9 - 10.3 mg/dL   GFR, Estimated >39 >39 mL/min   Anion gap 10 5 - 15  Assessment & Plan:   Problem List Items Addressed This Visit   None Visit Diagnoses       Pinworms    -  Primary   Relevant Medications   mebendazole  (VERMOX ) 100 MG chewable tablet        Assessment and Plan Assessment & Plan Pinworm infection (Enterobiasis) Symptoms consistent with pinworm infection, including rectal itching and sensation of worms for about a month. No visual confirmation, but symptoms align with common presentation. Pinworm is most likely given symptomatology. - Prescribe medication: take one pill now and another in two weeks. - Instruct to wash all sheets and clothing in hot water to eliminate potential eggs. - Advise on good hand hygiene, especially when preparing food. - Provide educational printout on pinworm infection and hygiene measures. - Inform that husband may also be at risk and should practice good hygiene.        Follow up  plan: Return if symptoms worsen or fail to improve.

## 2024-08-05 ENCOUNTER — Telehealth: Payer: Self-pay

## 2024-08-05 NOTE — Telephone Encounter (Signed)
 Application for Entresto  sent to [employer] in separate encounter. Will follow up

## 2024-08-05 NOTE — Telephone Encounter (Signed)
 Advanced Heart Failure Patient Advocate Encounter  Application for Entresto  faxed to [employer] on 08/05/2024. Application form attached to patient chart.  Rachel DEL, CPhT Rx Patient Advocate Phone: [phone]

## 2024-08-06 MED ORDER — ROSUVASTATIN CALCIUM 20 MG PO TABS: 20.0000 mg | ORAL_TABLET | Freq: Every day | ORAL | 1 refills | Status: DC

## 2024-08-06 NOTE — Telephone Encounter (Signed)
 Reviewed lab results and provider message below with patient. Pt verbalized understanding and agreement to contact her PCP regarding her blood glucose, and agreed to begin taking 20 mg rosuvastatin  daily. Pt asked that we send new prescription now. Denied any further questions or concerns. Rx sent to pt's preferred pharmacy.

## 2024-08-06 NOTE — Telephone Encounter (Signed)
-----   Message from Ellouise DELENA Class sent at 08/06/2024 10:17 AM EDT ----- Please call with unread mychart message.  ----- Message ----- From: Interface, Lab In [place] Sent: 07/31/2024  10:21 AM EDT To: Ellouise DELENA Class, FNP

## 2024-08-07 NOTE — Telephone Encounter (Signed)
 [employer] is requesting proof of income before making a determination for this patient. Spoke to pt by phone, she will email me a copy of 1040, or will provide 1040 at HF office to be scanned to me.

## 2024-08-10 ENCOUNTER — Emergency Department
Admission: EM | Admit: 2024-08-10 | Discharge: 2024-08-10 | Disposition: A | Discharge status: Home / Self Care | Payer: Self-pay | Attending: Emergency Medicine | Admitting: Emergency Medicine

## 2024-08-10 ENCOUNTER — Other Ambulatory Visit: Payer: Self-pay

## 2024-08-10 DIAGNOSIS — H81399 Other peripheral vertigo, unspecified ear: Secondary | ICD-10-CM | POA: Insufficient documentation

## 2024-08-10 DIAGNOSIS — E1165 Type 2 diabetes mellitus with hyperglycemia: Secondary | ICD-10-CM | POA: Insufficient documentation

## 2024-08-10 DIAGNOSIS — I110 Hypertensive heart disease with heart failure: Secondary | ICD-10-CM | POA: Insufficient documentation

## 2024-08-10 DIAGNOSIS — R739 Hyperglycemia, unspecified: Secondary | ICD-10-CM

## 2024-08-10 DIAGNOSIS — I2510 Atherosclerotic heart disease of native coronary artery without angina pectoris: Secondary | ICD-10-CM | POA: Insufficient documentation

## 2024-08-10 DIAGNOSIS — I509 Heart failure, unspecified: Secondary | ICD-10-CM | POA: Insufficient documentation

## 2024-08-10 LAB — URINALYSIS, ROUTINE W REFLEX MICROSCOPIC
Bacteria, UA: NONE SEEN
Bilirubin Urine: NEGATIVE
Glucose, UA: >=500 mg/dL — AB
Hgb urine dipstick: NEGATIVE
Ketones, ur: NEGATIVE mg/dL
Leukocytes,Ua: NEGATIVE
Nitrite: NEGATIVE
Protein, ur: NEGATIVE mg/dL
Specific Gravity, Urine: 1.031 — ABNORMAL HIGH (ref 1.005–1.030)
pH: 5 (ref 5.0–8.0)

## 2024-08-10 LAB — CBC WITH DIFFERENTIAL/PLATELET
Abs Immature Granulocytes: 0.02 K/uL (ref 0.00–0.07)
Basophils Absolute: 0 K/uL (ref 0.0–0.1)
Basophils Relative: 0 %
Eosinophils Absolute: 0.1 K/uL (ref 0.0–0.5)
Eosinophils Relative: 1 %
HCT: 38.4 % (ref 36.0–46.0)
Hemoglobin: 12.9 g/dL (ref 12.0–15.0)
Immature Granulocytes: 0 %
Lymphocytes Relative: 42 %
Lymphs Abs: 2.4 K/uL (ref 0.7–4.0)
MCH: 29.4 pg (ref 26.0–34.0)
MCHC: 33.6 g/dL (ref 30.0–36.0)
MCV: 87.5 fL (ref 80.0–100.0)
Monocytes Absolute: 0.4 K/uL (ref 0.1–1.0)
Monocytes Relative: 7 %
Neutro Abs: 2.9 K/uL (ref 1.7–7.7)
Neutrophils Relative %: 50 %
Platelets: 266 K/uL (ref 150–400)
RBC: 4.39 MIL/uL (ref 3.87–5.11)
RDW: 12.9 % (ref 11.5–15.5)
WBC: 5.7 K/uL (ref 4.0–10.5)
nRBC: 0 % (ref 0.0–0.2)

## 2024-08-10 LAB — COMPREHENSIVE METABOLIC PANEL WITH GFR
ALT: 15 U/L (ref 0–44)
AST: 27 U/L (ref 15–41)
Albumin: 3.7 g/dL (ref 3.5–5.0)
Alkaline Phosphatase: 94 U/L (ref 38–126)
Anion gap: 10 (ref 5–15)
BUN: 16 mg/dL (ref 6–20)
CO2: 19 mmol/L — ABNORMAL LOW (ref 22–32)
Calcium: 8.9 mg/dL (ref 8.9–10.3)
Chloride: 101 mmol/L (ref 98–111)
Creatinine, Ser: 0.81 mg/dL (ref 0.44–1.00)
GFR, Estimated: >60 mL/min (ref >60)
Glucose, Bld: 565 mg/dL (ref 70–99)
Potassium: 4.1 mmol/L (ref 3.5–5.1)
Sodium: 130 mmol/L — ABNORMAL LOW (ref 135–145)
Total Bilirubin: 0.7 mg/dL (ref 0.0–1.2)
Total Protein: 7.8 g/dL (ref 6.5–8.1)

## 2024-08-10 LAB — CBG MONITORING, ED
Glucose-Capillary: 447 mg/dL — ABNORMAL HIGH (ref 70–99)
Glucose-Capillary: 240 mg/dL — ABNORMAL HIGH (ref 70–99)

## 2024-08-10 LAB — BRAIN NATRIURETIC PEPTIDE: B Natriuretic Peptide: 25.8 pg/mL (ref 0.0–100.0)

## 2024-08-10 LAB — TROPONIN I (HIGH SENSITIVITY): Troponin I (High Sensitivity): 7 ng/L (ref <18)

## 2024-08-10 MED ORDER — MECLIZINE HCL 25 MG PO TABS: 25.0000 mg | ORAL_TABLET | Freq: Three times a day (TID) | ORAL | 0 refills | Status: AC | PRN

## 2024-08-10 MED ORDER — SODIUM CHLORIDE 0.9 % IV BOLUS
1000.0000 mL | Freq: Once | INTRAVENOUS | Status: AC
  Administered 2024-08-10: 1000 mL via INTRAVENOUS

## 2024-08-10 MED ORDER — INSULIN ASPART 100 UNIT/ML IJ SOLN
7.0000 [IU] | Freq: Once | INTRAMUSCULAR | Status: AC
  Administered 2024-08-10: 7 [IU] via SUBCUTANEOUS
  Filled 2024-08-10: qty 7

## 2024-08-10 NOTE — Discharge Instructions (Addendum)
 Take the meclizine  as needed for vertigo symptoms over the next several days.  Make an appointment to follow-up with the ENT to evaluate you if you have continued intermittent vertigo symptoms.  Make sure to check your blood glucose at least once or twice daily for the next few days and take all of your insulin  and diabetic medications.  Return to the ER immediately for new, worsening, or persistent severe dizziness, more frequent episodes, more severe episodes, if the dizziness is not getting better, if you have any weakness or numbness in your arms or legs, vision changes, difficulty speaking, difficulty walking, recurrent high blood glucose readings, or any other new or worsening symptoms that concern you.

## 2024-08-10 NOTE — ED Notes (Signed)
 Attempted IV access x2 with no success. Delay in meds.

## 2024-08-10 NOTE — ED Triage Notes (Signed)
 Pt to ED for intermittent lightheadedness since 3 days ago. States it feels like when she turns her head, everything looks like in slow motion. Endorses mild blurry vision to R eye and HA since 3d. Hx HTN, DM. Denies N/V, CP and SOB. Denies weakness and numbness. Pt ambulatory w steady gait. No thinners.

## 2024-08-10 NOTE — ED Provider Notes (Signed)
 [facility] Provider Note    Event Date/Time   First MD Initiated Contact with Patient 08/10/24 1547     (approximate)   History   No chief complaint on file.   HPI  Jane Morrison is a 63 y.o. female with history of DM, hypertension, hyperlipidemia, CAD, and CHF who presents with intermittent episodes of dizziness over the last 2 days, occurring multiple times throughout the day and lasting a few minutes.  The patient states that they occur when she turns her head.  She will suddenly feel like everything is getting away from her or like the room is spinning.  This will subside if she is still.  She states that she also feels lightheaded when this happens and sometimes is felt like she might pass out.  She denies any nausea or vomiting.  She has a mild headache.  She has no vision changes.  She denies any chest pain or difficulty breathing.  She has no abdominal pain.  She denies any leg swelling.  Reviewed the past medical records.  The patient was most recently evaluated by the heart failure clinic for an outpatient visit on 8/20 reporting fatigue and increased shortness of breath at that time.   Physical Exam   Triage Vital Signs: ED Triage Vitals  Encounter Vitals Group     BP 08/10/24 1541 (!) 179/92     Girls Systolic BP Percentile --      Girls Diastolic BP Percentile --      Boys Systolic BP Percentile --      Boys Diastolic BP Percentile --      Pulse Rate 08/10/24 1541 69     Resp 08/10/24 1541 18     Temp 08/10/24 1541 97.9 F (36.6 C)     Temp Source 08/10/24 1541 Oral     SpO2 08/10/24 1541 100 %     Weight 08/10/24 1539 161 lb (73 kg)     Height 08/10/24 1539 5' 9 (1.753 m)     Head Circumference --      Peak Flow --      Pain Score 08/10/24 1539 0     Pain Loc --      Pain Education --      Exclude from Growth Chart --     Most recent vital signs: Vitals:   08/10/24 1930 08/10/24 2000  BP: (!) 169/96 (!) 169/95  Pulse: 64  68  Resp: 18 18  Temp:    SpO2: 97% 98%    General: Awake, no distress.  CV:  Good peripheral perfusion.  Resp:  Normal effort.  Lungs CTAB. Abd:  No distention.  Other:  EOMI.  PERRLA.  No photophobia.  No facial droop.  Normal speech.  Motor and sensory intact in all extremities.  No ataxia on finger-to-nose.  No pronator drift.  No nystagmus.  Normal TMs and ear canals bilaterally.   ED Results / Procedures / Treatments   Labs (all labs ordered are listed, but only abnormal results are displayed) Labs Reviewed  COMPREHENSIVE METABOLIC PANEL WITH GFR - Abnormal; Notable for the following components:      Result Value   Sodium 130 (*)    CO2 19 (*)    Glucose, Bld 565 (*)    All other components within normal limits  URINALYSIS, ROUTINE W REFLEX MICROSCOPIC - Abnormal; Notable for the following components:   Color, Urine COLORLESS (*)    APPearance CLEAR (*)  Specific Gravity, Urine 1.031 (*)    Glucose, UA >=500 (*)    All other components within normal limits  CBG MONITORING, ED - Abnormal; Notable for the following components:   Glucose-Capillary 447 (*)    All other components within normal limits  CBG MONITORING, ED - Abnormal; Notable for the following components:   Glucose-Capillary 240 (*)    All other components within normal limits  CBC WITH DIFFERENTIAL/PLATELET  BRAIN NATRIURETIC PEPTIDE  TROPONIN I (HIGH SENSITIVITY)     EKG  ED ECG REPORT I, Waylon Cassis, the attending physician, personally viewed and interpreted this ECG.  Date: 08/10/2024 EKG Time: 1600 Rate: 63 Rhythm: normal sinus rhythm QRS Axis: Left axis Intervals: normal ST/T Wave abnormalities: normal Narrative Interpretation: no evidence of acute ischemia    RADIOLOGY     PROCEDURES:  Critical Care performed: No  Procedures   MEDICATIONS ORDERED IN ED: Medications  sodium chloride  0.9 % bolus 1,000 mL (0 mLs Intravenous Stopped 08/10/24 1910)  insulin  aspart  (novoLOG ) injection 7 Units (7 Units Subcutaneous Given 08/10/24 1656)     IMPRESSION / MDM / ASSESSMENT AND PLAN / ED COURSE  I reviewed the triage vital signs and the nursing notes.  63 year old female with PMH as noted above presents with intermittent brief episodes of dizziness that are worse with turning her head and then resolve with being still.  On exam her blood pressure is elevated.  Other vital signs are normal.  Thorough neurologic exam is normal.  Differential diagnosis includes, but is not limited to, peripheral vertigo, dehydration, electrolyte abnormality, other metabolic cause, anemia, UTI or other infection, less likely cardiac etiology.  Based on the fact that the patient has intermittent symptoms and a completely normal neuroexam, there is no evidence of central vertigo.  The patient is asymptomatic currently.  We will obtain lab workup and reassess.  Patient's presentation is most consistent with acute complicated illness / injury requiring diagnostic workup.  The patient is on the cardiac monitor to evaluate for evidence of arrhythmia and/or significant heart rate changes.  ----------------------------------------- 8:35 AM on 08/10/2024 -----------------------------------------  Lab workup was significant for hyperglycemia but with a normal anion gap and no evidence of DKA or other acute complication.  CBC shows no acute findings.  Urinalysis was negative except for glucose.  Troponin and BNP were negative.  The patient had no recurrent dizziness while in the ED.  After fluids and insulin , her blood glucose is now in the 200s.  She is comfortable.  There is no clinical evidence of CNS etiology of the patient's dizziness.  I suspect that this is accommodation of peripheral vertigo with some lightheadedness related to the acute hyperglycemia.  The patient feels well and would like to go home.  I counseled her on the results of the workup.  I gave strict return precautions and  she expressed understanding.  I prescribed meclizine  for symptomatic treatment of the vertigo.   FINAL CLINICAL IMPRESSION(S) / ED DIAGNOSES   Final diagnoses:  Peripheral vertigo, unspecified laterality  Hyperglycemia     Rx / DC Orders   ED Discharge Orders          Ordered    meclizine  (ANTIVERT ) 25 MG tablet  3 times daily PRN        08/10/24 1938             Note:  This document was prepared using Dragon voice recognition software and may include unintentional dictation errors.  Jacolyn Pae, MD 08/11/24 [phone]

## 2024-08-13 ENCOUNTER — Encounter: Payer: Self-pay | Admitting: Acute Care

## 2024-08-14 NOTE — Telephone Encounter (Signed)
 [employer] is requesting proof of income before making a determination for this patient. Attempted to contact patient for update, no answer. Left voicemail.

## 2024-08-19 NOTE — Telephone Encounter (Signed)
 [employer] is requesting proof of income before making a determination for this patient. Attempted to contact patient for update, no answer. Left voicemail. Will be available in the future if pt decided to pursue medication assistance.

## 2024-09-03 ENCOUNTER — Telehealth: Payer: Self-pay | Admitting: Family

## 2024-09-03 NOTE — Telephone Encounter (Signed)
 Called to confirm/remind patient of their appointment at the [facility] on 09/04/24.   Appointment:   [] Confirmed  [x] Left mess   [] No answer/No voice mail  [] VM Full/unable to leave message  [] Phone not in service  Patient reminded to bring all medications and/or complete list.  Confirmed patient has transportation. Gave directions, instructed to utilize valet parking.

## 2024-09-04 ENCOUNTER — Encounter: Admitting: Family

## 2024-09-04 ENCOUNTER — Telehealth: Payer: Self-pay | Admitting: Family

## 2024-09-04 NOTE — Telephone Encounter (Signed)
 Patient did not show for her [facility] Failure Clinic appointment on 09/04/24.

## 2024-09-04 NOTE — Progress Notes (Deleted)
 [facility] Note    PCP: Gareth Mliss FALCON, FNP  Cardiologist: Ammon Blunt, MD (last seen during 04/24 admission)  Chief Complaint: fatigue   HPI:  Jane Morrison is a 63 y/o female with a history of DM, hyperlipidemia, HTN, anxiety, depression, CAD, tobacco use & chronic heart failure (newly diagnosed)  Admitted 03/30/23 due to acute onset of worsening dyspnea, associated orthopnea, chest pressure and cough productive of clear sputum as well as wheezing. Diuresed with IV lasix . NTG drip started and cardiology consulted. Initially needed bipap due to hypoxia but able to be weaned off to room air. Echo 03/30/23: EF 25-30% with mild/ moderate LVH consistent with Grade I DD and mild MR.   RHC/LHC 04/03/23:   Prox RCA to Mid RCA lesion is 70% stenosed.   Dist RCA lesion is 50% stenosed.   Ost LAD to Prox LAD lesion is 30% stenosed.   There is moderate left ventricular systolic dysfunction.   The left ventricular ejection fraction is 35-45% by visual estimate.  1.  Normal right sided heart pressures (PA 25/7 (13), PCWP 6/4) 2.  Mild to moderate coronary artery disease with 70% stenosis mid RCA 3.  Moderately reduced left ventricular function with estimated LV ejection fraction 35-45%  Was in the ED 04/27/23 after a mechanical fall after she tripped while at work at [employer]. Hit her head on the ground but no LOC. Negative imaging.   Seen in the HF clinic 04/25 and carvedilol  was increased to 25mg  BID and jardiance  was resumed.   Seen in [facility] 05/25 and jardiance / entresto  were both resumed. Crestor  10mg  was started after getting lab results back.   She presents today for a HF follow-up visit with a chief complaint of fatigue. Has occasional dizziness. Biggest concern is of a sensation of feeling of worms moving around rectum. She says that she thinks she has seen them in bowel movements at times.  She has been out of jardiance  since yesterday and has been out of  entresto  for last 2 days. She did bring her medicaid denial letter.     Hasn't smoked since 04/24.   ROS: All systems negative except as listed in HPI, PMH and Problem List.  SH:  Social History   Socioeconomic History   Marital status: Married    Spouse name: Not on file   Number of children: 3   Years of education: Not on file   Highest education level: Associate degree: academic program  Occupational History   Not on file  Tobacco Use   Smoking status: Former    Current packs/day: 0.00    Average packs/day: 0.5 packs/day for 40.0 years (20.0 ttl pk-yrs)    Types: Cigarettes    Start date: 03/29/1983    Quit date: 03/29/2023    Years since quitting: 1.4    Passive exposure: Past   Smokeless tobacco: Never  Vaping Use   Vaping status: Never Used  Substance and Sexual Activity   Alcohol use: No   Drug use: No   Sexual activity: Yes  Other Topics Concern   Not on file  Social History Narrative   3 biological kids 4 stepchildren   Social Drivers of Health   Financial Resource Strain: Low Risk  (01/23/2024)   Overall Financial Resource Strain (CARDIA)    Difficulty of Paying Living Expenses: Not very hard  Food Insecurity: Food Insecurity Present (01/23/2024)   Hunger Vital Sign    Worried About Programme researcher, broadcasting/film/video in  the Last Year: Never true    Ran Out of Food in the Last Year: Sometimes true  Transportation Needs: No Transportation Needs (01/23/2024)   PRAPARE - Administrator, Civil Service (Medical): No    Lack of Transportation (Non-Medical): No  Physical Activity: Insufficiently Active (01/23/2024)   Exercise Vital Sign    Days of Exercise per Week: 2 days    Minutes of Exercise per Session: 10 min  Stress: No Stress Concern Present (01/23/2024)   [employer] of Occupational Health - Occupational Stress Questionnaire    Feeling of Stress : Only a little  Social Connections: Socially Integrated (01/23/2024)   Social Connection and Isolation  Panel    Frequency of Communication with Friends and Family: More than three times a week    Frequency of Social Gatherings with Friends and Family: More than three times a week    Attends Religious Services: 1 to 4 times per year    Active Member of [employer] or Organizations: Yes    Attends Banker Meetings: 1 to 4 times per year    Marital Status: Married  Catering manager Violence: Not At Risk (07/26/2023)   Humiliation, Afraid, Rape, and Kick questionnaire    Fear of Current or Ex-Partner: No    Emotionally Abused: No    Physically Abused: No    Sexually Abused: No    FH:  Family History  Problem Relation Age of Onset   Hypertension Mother    Diabetes Mother    Hypertension Father    Diabetes Father    Breast cancer Sister 85    Past Medical History:  Diagnosis Date   Anxiety    CHF (congestive heart failure) (HCC)    was taken off lasix    Depression    Diabetes mellitus without complication (HCC)    Hyperlipidemia    Hypertension     Current Outpatient Medications  Medication Sig Dispense Refill   aspirin  81 MG chewable tablet Chew 1 tablet (81 mg total) by mouth daily. 90 tablet 0   blood glucose meter kit and supplies KIT Dispense based on patient and insurance preference. Use up to four times daily as directed. (FOR ICD-9 250.00, 250.01). 1 each 0   Blood Glucose Monitoring Suppl (ONETOUCH VERIO FLEX SYSTEM) w/Device KIT Use to check blood sugar up to four times daily as directed 1 kit 0   carvedilol  (COREG ) 25 MG tablet Take 1 tablet (25 mg total) by mouth 2 (two) times daily. 180 tablet 3   Dulaglutide  (TRULICITY ) 1.5 MG/0.5ML SOAJ Inject 1.5 mg into the skin once a week. (Patient not taking: Reported on 07/31/2024) 6 mL 0   Glucagon  (GVOKE HYPOPEN  1-PACK) 0.5 MG/0.1ML SOAJ Inject 0.5 mg into the skin once as needed for up to 1 dose (for hypoglycemia). 0.1 mL 3   glucose blood (ONETOUCH VERIO) test strip Use to check blood sugar up to four times daily as  directed 400 each 3   insulin  degludec (TRESIBA  FLEXTOUCH) 100 UNIT/ML FlexTouch Pen Inject 16 Units into the skin daily.     Insulin  Pen Needle (BD PEN NEEDLE MICRO U/F) 32G X 6 MM MISC Use to administer insulin  100 each 5   Lancets (ONETOUCH DELICA PLUS LANCET30G) MISC Use to check blood sugar up to four times daily as directed 400 each 3   mebendazole  (VERMOX ) 100 MG chewable tablet Take 1 tablet and then repeat in 2 weeks 1 tablet 1   meclizine  (ANTIVERT ) 25 MG  tablet Take 1 tablet (25 mg total) by mouth 3 (three) times daily as needed. 15 tablet 0   metFORMIN  (GLUCOPHAGE ) 500 MG tablet TAKE 1 TABLET BY MOUTH TWICE A DAY WITH FOOD 180 tablet 0   rosuvastatin  (CRESTOR ) 20 MG tablet Take 1 tablet (20 mg total) by mouth daily. 90 tablet 1   sacubitril -valsartan  (ENTRESTO ) 97-103 MG Take 1 tablet by mouth 2 (two) times daily. (Patient not taking: Reported on 07/31/2024) 60 tablet 11   spironolactone  (ALDACTONE ) 25 MG tablet Take 25 mg by mouth daily.     No current facility-administered medications for this visit.   There were no vitals filed for this visit.  Wt Readings from Last 3 Encounters:  08/10/24 73 kg  07/31/24 73.5 kg  07/31/24 74 kg   Lab Results  Component Value Date   CREATININE 0.81 08/10/2024   CREATININE 0.92 07/31/2024   CREATININE 1.00 04/17/2024    PHYSICAL EXAM:  General: Well appearing.  Cor: No JVD. Regular rhythm, rate.  Lungs: clear Abdomen: soft, nontender, nondistended. Extremities: no edema Neuro:. Affect pleasant   ECG: not done   ASSESSMENT & PLAN:  1: NICM with reduced ejection fraction- - etiology likely HTN/ uncontrolled DM - NYHA class II - euvolemic - weighing daily; reminded to call for an overnight weight gain of > 2 pounds or a weekly weight gain of >5 pounds - weight stable from last visit here 3 months ago - Echo 03/30/23: EF 25-30% with mild/ moderate LVH consistent with Grade I DD and mild MR.  - will get echo updated once GDMT  is stabilized and she's able to consistently take meds - continue carvedilol  25mg  BID - resume jardiance  10mg  daily. Samples provided today and RN will reach out to pharm regarding patient's medicaid denial letter - resume entresto  97/103mg  BID - continue spironolactone  25mg  daily - BNP 03/30/23 was 471.0  2: HTN- - BP 154/100. Resuming entresto  per above - saw PCP [place]) 05/25 - BMP 04/17/24 reviewed: sodium 135, potassium 4.2, creatinine 1.00 & GFR >60 - BMET today  3: CAD- - saw cardiology (Paraschos) during admission 04/24. Reached out to provider's office to get appt scheduled and they will contact patient  - LDL 04/17/24 reviewed and was 124 - RHC/LHC 04/03/23:   Prox RCA to Mid RCA lesion is 70% stenosed.   Dist RCA lesion is 50% stenosed.   Ost LAD to Prox LAD lesion is 30% stenosed.   There is moderate left ventricular systolic dysfunction.   The left ventricular ejection fraction is 35-45% by visual estimate.  1.  Normal right sided heart pressures (PA 25/7 (13), PCWP 6/4) 2.  Mild to moderate coronary artery disease with 70% stenosis mid RCA 3.  Moderately reduced left ventricular function with estimated LV ejection fraction 35-45%  4: DM (managed by PCP)- - A1c 04/24/24 was 10.2% - continue metformin  500mg  BID - awaiting endocrinology appt (referral made by PCP) - continue trulicity  weekly  5: Hyperlipidemia- - LDL 04/17/24 was 107 - lipid panel today - continue rosuvastatin  10mg  daily.   Reached out to PCP regarding her sensation of feeling worms around rectum. PCP said that we could send patient over to PCP office and they would work her in. Patient informed.    Return in here in 4-6 weeks, sooner if needed.   Solomon Blumenthal, RN 09/04/24

## 2024-09-12 ENCOUNTER — Other Ambulatory Visit: Payer: Self-pay | Admitting: Family

## 2024-09-19 ENCOUNTER — Encounter: Payer: Self-pay | Admitting: Emergency Medicine

## 2024-09-19 ENCOUNTER — Other Ambulatory Visit: Payer: Self-pay

## 2024-09-19 ENCOUNTER — Emergency Department
Admission: EM | Admit: 2024-09-19 | Discharge: 2024-09-19 | Disposition: A | Discharge status: Home / Self Care | Payer: Self-pay | Attending: Emergency Medicine | Admitting: Emergency Medicine

## 2024-09-19 DIAGNOSIS — B9689 Other specified bacterial agents as the cause of diseases classified elsewhere: Secondary | ICD-10-CM | POA: Insufficient documentation

## 2024-09-19 DIAGNOSIS — Z794 Long term (current) use of insulin: Secondary | ICD-10-CM | POA: Insufficient documentation

## 2024-09-19 DIAGNOSIS — E119 Type 2 diabetes mellitus without complications: Secondary | ICD-10-CM | POA: Insufficient documentation

## 2024-09-19 DIAGNOSIS — N760 Acute vaginitis: Secondary | ICD-10-CM | POA: Insufficient documentation

## 2024-09-19 DIAGNOSIS — I5022 Chronic systolic (congestive) heart failure: Secondary | ICD-10-CM | POA: Insufficient documentation

## 2024-09-19 DIAGNOSIS — Z720 Tobacco use: Secondary | ICD-10-CM | POA: Insufficient documentation

## 2024-09-19 DIAGNOSIS — I110 Hypertensive heart disease with heart failure: Secondary | ICD-10-CM | POA: Insufficient documentation

## 2024-09-19 DIAGNOSIS — B80 Enterobiasis: Secondary | ICD-10-CM | POA: Insufficient documentation

## 2024-09-19 LAB — URINALYSIS, W/ REFLEX TO CULTURE (INFECTION SUSPECTED)
Bacteria, UA: NONE SEEN
Bilirubin Urine: NEGATIVE
Glucose, UA: >=500 mg/dL — AB
Ketones, ur: 5 mg/dL — AB
Leukocytes,Ua: NEGATIVE
Nitrite: NEGATIVE
Protein, ur: NEGATIVE mg/dL
Specific Gravity, Urine: 1.028 (ref 1.005–1.030)
pH: 5 (ref 5.0–8.0)

## 2024-09-19 LAB — WET PREP, GENITAL
Sperm: NONE SEEN
Trich, Wet Prep: NONE SEEN
WBC, Wet Prep HPF POC: >=10 — AB (ref <10)
Yeast Wet Prep HPF POC: NONE SEEN

## 2024-09-19 MED ORDER — METRONIDAZOLE 0.75 % VA GEL: 1.0000 | Freq: Two times a day (BID) | VAGINAL | 0 refills | Status: AC

## 2024-09-19 NOTE — ED Provider Notes (Signed)
 [facility] Provider Note    Event Date/Time   First MD Initiated Contact with Patient 09/19/24 1154     (approximate)   History   Pinworm   HPI  Jane Morrison is a 63 y.o. female with a past medical history of hyperlipidemia, diabetes, heart failure, tobacco use, hypertension who presents today with a chief complaint of pinworms.  Patient reports that she was diagnosed with pinworms by her PCP based on her symptomatology and August and was given a medication to treat this that she could not afford.  Patient still feels itchy in her perineal area and her vaginal area.  No fevers or chills.  She denies concern for STDs and does not wish to be tested for them today.  Patient Active Problem List   Diagnosis Date Noted   Mixed hyperlipidemia 04/24/2024   Uncontrolled type 2 diabetes mellitus with hyperglycemia, with long-term current use of insulin  (HCC) 03/30/2023   Chronic HFrEF (heart failure with reduced ejection fraction) (HCC) 03/30/2023   Tobacco dependence 06/13/2022   Essential hypertension 04/05/2019          Physical Exam   Triage Vital Signs: ED Triage Vitals  Encounter Vitals Group     BP 09/19/24 1100 (!) 158/96     Girls Systolic BP Percentile --      Girls Diastolic BP Percentile --      Boys Systolic BP Percentile --      Boys Diastolic BP Percentile --      Pulse Rate 09/19/24 1100 69     Resp 09/19/24 1100 16     Temp 09/19/24 1100 98.3 F (36.8 C)     Temp Source 09/19/24 1100 Oral     SpO2 09/19/24 1101 99 %     Weight --      Height --      Head Circumference --      Peak Flow --      Pain Score 09/19/24 1101 0     Pain Loc --      Pain Education --      Exclude from Growth Chart --     Most recent vital signs: Vitals:   09/19/24 1100 09/19/24 1101  BP: (!) 158/96   Pulse: 69   Resp: 16   Temp: 98.3 F (36.8 C)   SpO2:  99%    Physical Exam Vitals and nursing note reviewed.  Constitutional:       General: Awake and alert. No acute distress.    Appearance: Normal appearance. The patient is normal weight.  HENT:     Head: Normocephalic and atraumatic.     Mouth: Mucous membranes are moist.  Eyes:     General: PERRL. Normal EOMs        Right eye: No discharge.        Left eye: No discharge.     Conjunctiva/sclera: Conjunctivae normal.  Cardiovascular:     Rate and Rhythm: Normal rate and regular rhythm.     Pulses: Normal pulses.  Pulmonary:     Effort: Pulmonary effort is normal. No respiratory distress.     Breath sounds: Normal breath sounds.  Abdominal:     Abdomen is soft. There is no abdominal tenderness. No rebound or guarding. No distention. Musculoskeletal:        General: No swelling. Normal range of motion.     Cervical back: Normal range of motion and neck supple.  Skin:    General: Skin  is warm and dry.     Capillary Refill: Capillary refill takes less than 2 seconds.     Findings: No rash.  Neurological:     Mental Status: The patient is awake and alert.      ED Results / Procedures / Treatments   Labs (all labs ordered are listed, but only abnormal results are displayed) Labs Reviewed  WET PREP, GENITAL - Abnormal; Notable for the following components:      Result Value   Clue Cells Wet Prep HPF POC PRESENT (*)    WBC, Wet Prep HPF POC >=10 (*)    All other components within normal limits  URINALYSIS, W/ REFLEX TO CULTURE (INFECTION SUSPECTED) - Abnormal; Notable for the following components:   Color, Urine STRAW (*)    APPearance CLEAR (*)    Glucose, UA >=500 (*)    Hgb urine dipstick SMALL (*)    Ketones, ur 5 (*)    All other components within normal limits     EKG     RADIOLOGY     PROCEDURES:  Critical Care performed:   Procedures   MEDICATIONS ORDERED IN ED: Medications - No data to display   IMPRESSION / MDM / ASSESSMENT AND PLAN / ED COURSE  I reviewed the triage vital signs and the nursing notes.   Differential  diagnosis includes, but is not limited to, pinworms, uncontrolled glucose, yeast infection.  I reviewed the patient's chart.  Patient was seen by her PCP on 07/31/2024 who diagnosed her with pinworms based on her symptomatology, though no diagnostic testing was done.  She was given a prescription for mebendazole  100 mg.  Further workup is indicated.  Urinalysis obtained as well as a wet prep for evaluation of yeast.  This is positive for bacterial vaginosis.  Discussed this result with the patient which could also be causing her itchiness.  She denies concern for STDs and does not wish to be tested for them today.  We discussed options for treatment including p.o. versus intravaginal metronidazole.  Patient opts for intravaginal metronidazole.  This is sent to her pharmacy.  We discussed return precautions and outpatient follow-up.  Patient understands and agrees with plan.  She was discharged in stable condition.  Patient's presentation is most consistent with acute complicated illness / injury requiring diagnostic workup.    FINAL CLINICAL IMPRESSION(S) / ED DIAGNOSES   Final diagnoses:  Bacterial vaginosis     Rx / DC Orders   ED Discharge Orders          Ordered    metroNIDAZOLE (METROGEL) 0.75 % vaginal gel  2 times daily        09/19/24 1303             Note:  This document was prepared using Dragon voice recognition software and may include unintentional dictation errors.   Keirston Saephanh E, PA-C 09/19/24 1510    Viviann Pastor, MD 09/20/24 [phone]

## 2024-09-19 NOTE — ED Notes (Signed)
 Patient declined discharge vital signs.

## 2024-09-19 NOTE — ED Triage Notes (Signed)
 Pt reports feeling like something is crawling in her and given prescription for pinworm in end of August. States she was given prescription but could not afford the medication.

## 2024-09-19 NOTE — Discharge Instructions (Addendum)
 Your swab was positive for bacterial vaginosis.  Please follow-up with your outpatient provider.  Please take the medication as prescribed.  Please return for any new, worsening, or changing symptoms or other concerns.  It was a pleasure caring for you today.

## 2024-10-03 ENCOUNTER — Emergency Department
Admission: EM | Admit: 2024-10-03 | Discharge: 2024-10-03 | Disposition: A | Discharge status: Home / Self Care | Payer: Self-pay | Attending: Emergency Medicine | Admitting: Emergency Medicine

## 2024-10-03 ENCOUNTER — Other Ambulatory Visit: Payer: Self-pay

## 2024-10-03 DIAGNOSIS — N905 Atrophy of vulva: Secondary | ICD-10-CM | POA: Insufficient documentation

## 2024-10-03 DIAGNOSIS — R21 Rash and other nonspecific skin eruption: Secondary | ICD-10-CM

## 2024-10-03 DIAGNOSIS — I110 Hypertensive heart disease with heart failure: Secondary | ICD-10-CM | POA: Insufficient documentation

## 2024-10-03 DIAGNOSIS — E119 Type 2 diabetes mellitus without complications: Secondary | ICD-10-CM | POA: Insufficient documentation

## 2024-10-03 DIAGNOSIS — N952 Postmenopausal atrophic vaginitis: Secondary | ICD-10-CM

## 2024-10-03 DIAGNOSIS — I509 Heart failure, unspecified: Secondary | ICD-10-CM | POA: Insufficient documentation

## 2024-10-03 NOTE — ED Triage Notes (Signed)
 Pt to ED via POV from home. Pt reports was subscribed a rectal suppository for itching from pinworms. Pt reports cannot afford pinworm medicine. Pt states rash appeared around rectum and in vaginal area.

## 2024-10-03 NOTE — Discharge Instructions (Signed)
 Discontinue creams.  Perform warm sitz bath's 3 times daily.  Apply a small amount of petroleum jelly to the affected area.  Please follow-up with OB GYN for further evaluation.  If you have another diarrhea episode with stated parasites.  Please take a picture and follow-up with your primary care provider to discuss a referral with GI.

## 2024-10-03 NOTE — ED Provider Notes (Signed)
 [facility] Emergency Department Provider Note     Event Date/Time   First MD Initiated Contact with Patient 10/03/24 1821     (approximate)   History   Rash and Medication Reaction   HPI  Jane Morrison is a 63 y.o. female with a past medical history of diabetes, CHF, HLD, HTN and anxiety presents to the ED for evaluation of vaginal dryness and rash noted following medication use of MetroGel.  Patient reports she believes she has some type of parasites.  PCP diagnosed her with pinworms based on her symptoms and was prescribed mebendazole .      Physical Exam   Triage Vital Signs: ED Triage Vitals [10/03/24 1622]  Encounter Vitals Group     BP (!) 145/105     Girls Systolic BP Percentile      Girls Diastolic BP Percentile      Boys Systolic BP Percentile      Boys Diastolic BP Percentile      Pulse Rate 77     Resp 18     Temp 98.1 F (36.7 C)     Temp Source Oral     SpO2 98 %     Weight      Height      Head Circumference      Peak Flow      Pain Score 1     Pain Loc      Pain Education      Exclude from Growth Chart     Most recent vital signs: Vitals:   10/03/24 1622 10/03/24 1846  BP: (!) 145/105   Pulse: 77   Resp: 18   Temp: 98.1 F (36.7 C)   SpO2: 98% 98%    General Awake, no distress.  HEENT NCAT.  CV:  Good peripheral perfusion.  RESP:  Normal effort.  ABD:  No distention.  Other:  Pelvic exam performed.  Vaginal atrophy noted.  No lesions noted.  Normal external genitalia.  There is a pink flesh like skin change to perineal region.  No bleeding or open skin noted.  Nontender to palpation.   ED Results / Procedures / Treatments   Labs (all labs ordered are listed, but only abnormal results are displayed) Labs Reviewed - No data to display  No results found.  PROCEDURES:  Critical Care performed: No  Procedures   MEDICATIONS ORDERED IN ED: Medications - No data to display   IMPRESSION / MDM /  ASSESSMENT AND PLAN / ED COURSE  I reviewed the triage vital signs and the nursing notes.                               63 y.o. female presents to the emergency department for evaluation and treatment of rash and vaginal dryness. See HPI for further details.   Differential diagnosis includes, but is not limited to yeast infection, vaginal atrophy, medication reaction  Patient's presentation is most consistent with acute, uncomplicated illness.  Patient is alert and oriented.  She is hemodynamic stable.  Chart reviewed.  Patient evaluated in the ED on 09/19/2024 for concerns of pinworms and not being able to afford the prescribed mebendazole  from her PCP.  Patient is unable to provide a stool sample in ED to confirm noted parasites however patient reports she feels like they are crawling inside of her.  Normal pelvic exam.  I advised her to follow-up with OB  GYN for noted rash on perineal region.  It appears that the skin is raw I have advised her to discontinue the MetroGel, do sitz bath's 3 times daily and apply a small amount of petroleum jelly to the affected area to enhance healed skin.  Advise close follow-up with her PCP for further management and discussion of parasites.  She verbalized understanding.  She is in stable condition for discharge home.  ED return precaution discussed.   FINAL CLINICAL IMPRESSION(S) / ED DIAGNOSES   Final diagnoses:  Vaginal atrophy  Rash and nonspecific skin eruption     Rx / DC Orders   ED Discharge Orders     None        Note:  This document was prepared using Dragon voice recognition software and may include unintentional dictation errors.    Margrette, Kriya Westra A, PA-C 10/03/24 2105    Waymond Lorelle Cummins, MD 10/04/24 [phone]

## 2024-10-04 ENCOUNTER — Emergency Department
Admission: EM | Admit: 2024-10-04 | Discharge: 2024-10-04 | Disposition: A | Discharge status: Home / Self Care | Payer: Worker's Compensation

## 2024-10-04 ENCOUNTER — Other Ambulatory Visit: Payer: Self-pay

## 2024-10-04 ENCOUNTER — Emergency Department: Payer: Worker's Compensation

## 2024-10-04 DIAGNOSIS — M25571 Pain in right ankle and joints of right foot: Secondary | ICD-10-CM | POA: Diagnosis present

## 2024-10-04 DIAGNOSIS — E119 Type 2 diabetes mellitus without complications: Secondary | ICD-10-CM | POA: Diagnosis not present

## 2024-10-04 DIAGNOSIS — Y990 Civilian activity done for income or pay: Secondary | ICD-10-CM | POA: Diagnosis not present

## 2024-10-04 DIAGNOSIS — I10 Essential (primary) hypertension: Secondary | ICD-10-CM | POA: Diagnosis not present

## 2024-10-04 DIAGNOSIS — S93401A Sprain of unspecified ligament of right ankle, initial encounter: Secondary | ICD-10-CM | POA: Insufficient documentation

## 2024-10-04 DIAGNOSIS — X501XXA Overexertion from prolonged static or awkward postures, initial encounter: Secondary | ICD-10-CM | POA: Diagnosis not present

## 2024-10-04 MED ORDER — IBUPROFEN 800 MG PO TABS
800.0000 mg | ORAL_TABLET | Freq: Once | ORAL | Status: AC
Start: 2024-10-04 — End: 2024-10-04
  Administered 2024-10-04: 800 mg via ORAL
  Filled 2024-10-04: qty 1

## 2024-10-04 MED ORDER — HYDROCODONE-ACETAMINOPHEN 5-325 MG PO TABS: 1.0000 | ORAL_TABLET | Freq: Four times a day (QID) | ORAL | 0 refills | Status: AC | PRN

## 2024-10-04 NOTE — ED Triage Notes (Signed)
 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 Endorsing twisted left ankle. Able to ambulate with pain. PMS intact. Took Tylenol  PTA without pain relief. No obvious swelling.

## 2024-10-04 NOTE — ED Provider Notes (Signed)
   [facility] Provider Note    Event Date/Time   First MD Initiated Contact with Patient 10/04/24 2218     (approximate)   History   Ankle Pain   HPI  Jane Morrison is a 63 y.o. female with history of hypertension, uncontrolled type 2 diabetes, heart failure, hyperlipidemia and as listed in EMR presents to the emergency department for treatment and evaluation of right ankle pain.  While at work today, she was out in the shed and did not see the dip and twisted her right ankle.  She believes that she has sprained this ankle in the past.  Pain increases with attempt to bear weight.     Physical Exam    Vitals:   10/04/24 2144 10/04/24 2300  BP: (!) 202/93 (!) 184/89  Pulse: 100 88  Resp: 18 16  Temp: 97.9 F (36.6 C) 98 F (36.7 C)  SpO2: 100% 100%    General: Awake, no distress.  CV:  Good peripheral perfusion.  Resp:  Normal effort.  Abd:  No distention.  Other:  Diffuse swelling of the right ankle.  Motor and sensory function is intact.  Dorsalis pedal pulse is 2+.   ED Results / Procedures / Treatments   Labs (all labs ordered are listed, but only abnormal results are displayed)  Labs Reviewed - No data to display   EKG  Not indicated   RADIOLOGY  Image and radiology report reviewed and interpreted by me. Radiology report consistent with the same.  Image of the right ankle negative for acute bony abnormality.  PROCEDURES:  Critical Care performed: No  Procedures   MEDICATIONS ORDERED IN ED:  Medications  ibuprofen (ADVIL) tablet 800 mg (800 mg Oral Given 10/04/24 2307)     IMPRESSION / MDM / ASSESSMENT AND PLAN / ED COURSE   I have reviewed the triage note and vital signs. Vital signs are stable   Differential diagnosis includes, but is not limited to, ankle sprain, tibia fracture, fibula fracture  Patient's presentation is most consistent with acute illness / injury with system symptoms.  63 year old  female presenting to the emergency department for treatment and evaluation after twisting her ankle while at work today.  See HPI for further details.  X-ray is negative for acute bony abnormality.  She will be placed in a cam boot and advised to rest, ice, and elevate.  Follow-up instructions for podiatry will be provided at discharge.  Work excuse given to the patient and medication submitted to the pharmacy.      FINAL CLINICAL IMPRESSION(S) / ED DIAGNOSES   Final diagnoses:  Sprain of right ankle, unspecified ligament, initial encounter     Rx / DC Orders   ED Discharge Orders          Ordered    HYDROcodone-acetaminophen  (NORCO/VICODIN) 5-325 MG tablet  Every 6 hours PRN        10/04/24 2249             Note:  This document was prepared using Dragon voice recognition software and may include unintentional dictation errors.   Herlinda Kirk NOVAK, FNP 10/04/24 2339    Nicholaus Rolland BRAVO, MD 10/05/24 [phone]

## 2024-10-04 NOTE — Discharge Instructions (Signed)
 Rest, ice off-and-on 20 minutes/h, and keep the foot elevated as often as possible for the next few days.  If you take the pain medicine prescribed be aware that it may make you sleepy or drowsy and you should not drive or operate any heavy machinery for at least 8 hours after taking the last dose.  Do not take additional Tylenol  along with the pain medication prescribed.

## 2024-10-09 ENCOUNTER — Encounter: Payer: Self-pay | Admitting: Family

## 2024-10-10 ENCOUNTER — Other Ambulatory Visit (HOSPITAL_COMMUNITY): Payer: Self-pay

## 2024-10-22 ENCOUNTER — Telehealth: Payer: Self-pay | Admitting: Family

## 2024-10-22 NOTE — Telephone Encounter (Signed)
 Called to confirm/remind patient of their appointment at the [facility] on 10/23/24.   Appointment:   [] Confirmed  [x] Left mess   [] No answer/No voice mail  [] VM Full/unable to leave message  [] Phone not in service  Patient reminded to bring all medications and/or complete list.  Confirmed patient has transportation. Gave directions, instructed to utilize valet parking.

## 2024-10-22 NOTE — Progress Notes (Deleted)
 [facility] Note    PCP: Gareth Mliss FALCON, FNP  Cardiologist: Ammon Blunt, MD (last seen during 04/24 admission)  Chief Complaint: fatigue   HPI:  Jane Morrison is a 63 y/o female with a history of DM, hyperlipidemia, HTN, anxiety, depression, CAD, tobacco use & chronic heart failure (newly diagnosed)  Admitted 03/30/23 due to acute onset of worsening dyspnea, associated orthopnea, chest pressure and cough productive of clear sputum as well as wheezing. Diuresed with IV lasix . NTG drip started and cardiology consulted. Initially needed bipap due to hypoxia but able to be weaned off to room air. Echo 03/30/23: EF 25-30% with mild/ moderate LVH consistent with Grade I DD and mild MR.   RHC/LHC 04/03/23:   Prox RCA to Mid RCA lesion is 70% stenosed.   Dist RCA lesion is 50% stenosed.   Ost LAD to Prox LAD lesion is 30% stenosed.   There is moderate left ventricular systolic dysfunction.   The left ventricular ejection fraction is 35-45% by visual estimate.  1.  Normal right sided heart pressures (PA 25/7 (13), PCWP 6/4) 2.  Mild to moderate coronary artery disease with 70% stenosis mid RCA 3.  Moderately reduced left ventricular function with estimated LV ejection fraction 35-45%  Was in the ED 04/27/23 after a mechanical fall after she tripped while at work at [employer]. Hit her head on the ground but no LOC. Negative imaging.   Seen in the HF clinic 04/25 and carvedilol  was increased to 25mg  BID and jardiance  was resumed.   Seen in [facility] 05/25 and jardiance / entresto  were both resumed. Crestor  10mg  was started after getting lab results back.   She presents today for a HF follow-up visit with a chief complaint of fatigue. Has occasional dizziness. Biggest concern is of a sensation of feeling of worms moving around rectum. She says that she thinks she has seen them in bowel movements at times.  She has been out of jardiance  since yesterday and has been out of  entresto  for last 2 days. She did bring her medicaid denial letter.     Hasn't smoked since 04/24.   ROS: All systems negative except as listed in HPI, PMH and Problem List.  SH:  Social History   Socioeconomic History   Marital status: Married    Spouse name: Not on file   Number of children: 3   Years of education: Not on file   Highest education level: Associate degree: academic program  Occupational History   Not on file  Tobacco Use   Smoking status: Former    Current packs/day: 0.00    Average packs/day: 0.5 packs/day for 40.0 years (20.0 ttl pk-yrs)    Types: Cigarettes    Start date: 03/29/1983    Quit date: 03/29/2023    Years since quitting: 1.5    Passive exposure: Past   Smokeless tobacco: Never  Vaping Use   Vaping status: Never Used  Substance and Sexual Activity   Alcohol use: No   Drug use: No   Sexual activity: Yes  Other Topics Concern   Not on file  Social History Narrative   3 biological kids 4 stepchildren   Social Drivers of Health   Financial Resource Strain: Low Risk  (01/23/2024)   Overall Financial Resource Strain (CARDIA)    Difficulty of Paying Living Expenses: Not very hard  Food Insecurity: Food Insecurity Present (01/23/2024)   Hunger Vital Sign    Worried About Programme Researcher, Broadcasting/film/video in  the Last Year: Never true    Ran Out of Food in the Last Year: Sometimes true  Transportation Needs: No Transportation Needs (01/23/2024)   PRAPARE - Administrator, Civil Service (Medical): No    Lack of Transportation (Non-Medical): No  Physical Activity: Insufficiently Active (01/23/2024)   Exercise Vital Sign    Days of Exercise per Week: 2 days    Minutes of Exercise per Session: 10 min  Stress: No Stress Concern Present (01/23/2024)   [employer] of Occupational Health - Occupational Stress Questionnaire    Feeling of Stress : Only a little  Social Connections: Socially Integrated (01/23/2024)   Social Connection and Isolation  Panel    Frequency of Communication with Friends and Family: More than three times a week    Frequency of Social Gatherings with Friends and Family: More than three times a week    Attends Religious Services: 1 to 4 times per year    Active Member of [employer] or Organizations: Yes    Attends Banker Meetings: 1 to 4 times per year    Marital Status: Married  Catering Manager Violence: Not At Risk (07/26/2023)   Humiliation, Afraid, Rape, and Kick questionnaire    Fear of Current or Ex-Partner: No    Emotionally Abused: No    Physically Abused: No    Sexually Abused: No    FH:  Family History  Problem Relation Age of Onset   Hypertension Mother    Diabetes Mother    Hypertension Father    Diabetes Father    Breast cancer Sister 94    Past Medical History:  Diagnosis Date   Anxiety    CHF (congestive heart failure) (HCC)    was taken off lasix    Depression    Diabetes mellitus without complication (HCC)    Hyperlipidemia    Hypertension     Current Outpatient Medications  Medication Sig Dispense Refill   aspirin  81 MG chewable tablet Chew 1 tablet (81 mg total) by mouth daily. 90 tablet 0   blood glucose meter kit and supplies KIT Dispense based on patient and insurance preference. Use up to four times daily as directed. (FOR ICD-9 250.00, 250.01). 1 each 0   Blood Glucose Monitoring Suppl (ONETOUCH VERIO FLEX SYSTEM) w/Device KIT Use to check blood sugar up to four times daily as directed 1 kit 0   carvedilol  (COREG ) 25 MG tablet Take 1 tablet (25 mg total) by mouth 2 (two) times daily. 180 tablet 3   Dulaglutide  (TRULICITY ) 1.5 MG/0.5ML SOAJ Inject 1.5 mg into the skin once a week. (Patient not taking: Reported on 07/31/2024) 6 mL 0   Glucagon  (GVOKE HYPOPEN  1-PACK) 0.5 MG/0.1ML SOAJ Inject 0.5 mg into the skin once as needed for up to 1 dose (for hypoglycemia). 0.1 mL 3   glucose blood (ONETOUCH VERIO) test strip Use to check blood sugar up to four times daily as  directed 400 each 3   insulin  degludec (TRESIBA  FLEXTOUCH) 100 UNIT/ML FlexTouch Pen Inject 16 Units into the skin daily.     Insulin  Pen Needle (BD PEN NEEDLE MICRO U/F) 32G X 6 MM MISC Use to administer insulin  100 each 5   Lancets (ONETOUCH DELICA PLUS LANCET30G) MISC Use to check blood sugar up to four times daily as directed 400 each 3   mebendazole  (VERMOX ) 100 MG chewable tablet Take 1 tablet and then repeat in 2 weeks 1 tablet 1   meclizine  (ANTIVERT ) 25 MG  tablet Take 1 tablet (25 mg total) by mouth 3 (three) times daily as needed. 15 tablet 0   metFORMIN  (GLUCOPHAGE ) 500 MG tablet TAKE 1 TABLET BY MOUTH TWICE A DAY WITH FOOD 180 tablet 0   rosuvastatin  (CRESTOR ) 20 MG tablet Take 1 tablet (20 mg total) by mouth daily. 90 tablet 1   sacubitril -valsartan  (ENTRESTO ) 97-103 MG Take 1 tablet by mouth 2 (two) times daily. (Patient not taking: Reported on 07/31/2024) 60 tablet 11   spironolactone  (ALDACTONE ) 25 MG tablet TAKE 1 TABLET (25 MG TOTAL) BY MOUTH DAILY. 30 tablet 11   No current facility-administered medications for this visit.   There were no vitals filed for this visit.  Wt Readings from Last 3 Encounters:  10/04/24 161 lb (73 kg)  08/10/24 161 lb (73 kg)  07/31/24 162 lb (73.5 kg)   Lab Results  Component Value Date   CREATININE 0.81 08/10/2024   CREATININE 0.92 07/31/2024   CREATININE 1.00 04/17/2024    PHYSICAL EXAM:  General: Well appearing.  Cor: No JVD. Regular rhythm, rate.  Lungs: clear Abdomen: soft, nontender, nondistended. Extremities: no edema Neuro:. Affect pleasant   ECG: not done   ASSESSMENT & PLAN:  1: NICM with reduced ejection fraction- - etiology likely HTN/ uncontrolled DM - NYHA Morrison II - euvolemic - weighing daily; reminded to call for an overnight weight gain of > 2 pounds or a weekly weight gain of >5 pounds - weight stable from last visit here 3 months ago - Echo 03/30/23: EF 25-30% with mild/ moderate LVH consistent with Grade  I DD and mild MR.  - will get echo updated once GDMT is stabilized and she's able to consistently take meds - continue carvedilol  25mg  BID - resume jardiance  10mg  daily. Samples provided today and RN will reach out to pharm regarding patient's medicaid denial letter - resume entresto  97/103mg  BID - continue spironolactone  25mg  daily - BNP 03/30/23 was 471.0  2: HTN- - BP 154/100. Resuming entresto  per above - saw PCP Murlean) 05/25 - BMP 04/17/24 reviewed: sodium 135, potassium 4.2, creatinine 1.00 & GFR >60 - BMET today  3: CAD- - saw cardiology (Paraschos) during admission 04/24. Reached out to provider's office to get appt scheduled and they will contact patient  - LDL 04/17/24 reviewed and was 124 - RHC/LHC 04/03/23:   Prox RCA to Mid RCA lesion is 70% stenosed.   Dist RCA lesion is 50% stenosed.   Ost LAD to Prox LAD lesion is 30% stenosed.   There is moderate left ventricular systolic dysfunction.   The left ventricular ejection fraction is 35-45% by visual estimate.  1.  Normal right sided heart pressures (PA 25/7 (13), PCWP 6/4) 2.  Mild to moderate coronary artery disease with 70% stenosis mid RCA 3.  Moderately reduced left ventricular function with estimated LV ejection fraction 35-45%  4: DM (managed by PCP)- - A1c 04/24/24 was 10.2% - continue metformin  500mg  BID - awaiting endocrinology appt (referral made by PCP) - continue trulicity  weekly  5: Hyperlipidemia- - LDL 04/17/24 was 107 - lipid panel today - continue rosuvastatin  10mg  daily.   Reached out to PCP regarding her sensation of feeling worms around rectum. PCP said that we could send patient over to PCP office and they would work her in. Patient informed.    Return in here in 4-6 weeks, sooner if needed.   Jane DELENA Class, FNP 10/22/24

## 2024-10-23 ENCOUNTER — Ambulatory Visit: Payer: Self-pay | Admitting: Family

## 2024-10-23 ENCOUNTER — Telehealth: Payer: Self-pay | Admitting: Family

## 2024-10-23 NOTE — Telephone Encounter (Signed)
 Patient did not show for her Heart Failure Clinic appointment on 10/23/24.

## 2024-11-04 ENCOUNTER — Other Ambulatory Visit: Payer: Self-pay

## 2024-11-04 ENCOUNTER — Emergency Department
Admission: EM | Admit: 2024-11-04 | Discharge: 2024-11-04 | Disposition: A | Discharge status: Home / Self Care | Payer: Self-pay | Attending: Emergency Medicine | Admitting: Emergency Medicine

## 2024-11-04 ENCOUNTER — Emergency Department: Payer: Self-pay

## 2024-11-04 DIAGNOSIS — R739 Hyperglycemia, unspecified: Secondary | ICD-10-CM

## 2024-11-04 DIAGNOSIS — I110 Hypertensive heart disease with heart failure: Secondary | ICD-10-CM | POA: Insufficient documentation

## 2024-11-04 DIAGNOSIS — E1165 Type 2 diabetes mellitus with hyperglycemia: Secondary | ICD-10-CM | POA: Insufficient documentation

## 2024-11-04 DIAGNOSIS — R519 Headache, unspecified: Secondary | ICD-10-CM | POA: Insufficient documentation

## 2024-11-04 DIAGNOSIS — I509 Heart failure, unspecified: Secondary | ICD-10-CM | POA: Insufficient documentation

## 2024-11-04 LAB — WET PREP, GENITAL
Clue Cells Wet Prep HPF POC: NONE SEEN
Sperm: NONE SEEN
Trich, Wet Prep: NONE SEEN
WBC, Wet Prep HPF POC: <10 (ref <10)
Yeast Wet Prep HPF POC: NONE SEEN

## 2024-11-04 LAB — CBC WITH DIFFERENTIAL/PLATELET
Abs Immature Granulocytes: 0.02 K/uL (ref 0.00–0.07)
Basophils Absolute: 0 K/uL (ref 0.0–0.1)
Basophils Relative: 0 %
Eosinophils Absolute: 0 K/uL (ref 0.0–0.5)
Eosinophils Relative: 1 %
HCT: 38.3 % (ref 36.0–46.0)
Hemoglobin: 12.8 g/dL (ref 12.0–15.0)
Immature Granulocytes: 0 %
Lymphocytes Relative: 28 %
Lymphs Abs: 1.6 K/uL (ref 0.7–4.0)
MCH: 29.2 pg (ref 26.0–34.0)
MCHC: 33.4 g/dL (ref 30.0–36.0)
MCV: 87.2 fL (ref 80.0–100.0)
Monocytes Absolute: 0.3 K/uL (ref 0.1–1.0)
Monocytes Relative: 6 %
Neutro Abs: 3.7 K/uL (ref 1.7–7.7)
Neutrophils Relative %: 65 %
Platelets: 234 K/uL (ref 150–400)
RBC: 4.39 MIL/uL (ref 3.87–5.11)
RDW: 12.9 % (ref 11.5–15.5)
WBC: 5.7 K/uL (ref 4.0–10.5)
nRBC: 0 % (ref 0.0–0.2)

## 2024-11-04 LAB — BASIC METABOLIC PANEL WITH GFR
Anion gap: 14 (ref 5–15)
BUN: 14 mg/dL (ref 8–23)
CO2: 25 mmol/L (ref 22–32)
Calcium: 9.2 mg/dL (ref 8.9–10.3)
Chloride: 94 mmol/L — ABNORMAL LOW (ref 98–111)
Creatinine, Ser: 1.2 mg/dL — ABNORMAL HIGH (ref 0.44–1.00)
GFR, Estimated: 51 mL/min — ABNORMAL LOW (ref >60)
Glucose, Bld: 739 mg/dL (ref 70–99)
Potassium: 4.7 mmol/L (ref 3.5–5.1)
Sodium: 133 mmol/L — ABNORMAL LOW (ref 135–145)

## 2024-11-04 LAB — CBG MONITORING, ED: Glucose-Capillary: 459 mg/dL — ABNORMAL HIGH (ref 70–99)

## 2024-11-04 MED ORDER — TRESIBA FLEXTOUCH 100 UNIT/ML ~~LOC~~ SOPN: 16.0000 [IU] | PEN_INJECTOR | Freq: Every day | SUBCUTANEOUS | 1 refills | Status: AC

## 2024-11-04 MED ORDER — INSULIN ASPART 100 UNIT/ML IJ SOLN
10.0000 [IU] | Freq: Once | INTRAMUSCULAR | Status: AC
  Administered 2024-11-04: 10 [IU] via INTRAVENOUS
  Filled 2024-11-04: qty 10

## 2024-11-04 MED ORDER — METFORMIN HCL 500 MG PO TABS: 500.0000 mg | ORAL_TABLET | Freq: Two times a day (BID) | ORAL | 0 refills | Status: DC

## 2024-11-04 MED ORDER — SODIUM CHLORIDE 0.9 % IV BOLUS
1000.0000 mL | Freq: Once | INTRAVENOUS | Status: AC
  Administered 2024-11-04: 1000 mL via INTRAVENOUS

## 2024-11-04 MED ORDER — KETOROLAC TROMETHAMINE 15 MG/ML IJ SOLN
15.0000 mg | Freq: Once | INTRAMUSCULAR | Status: AC
  Administered 2024-11-04: 15 mg via INTRAVENOUS
  Filled 2024-11-04: qty 1

## 2024-11-04 MED ORDER — ONDANSETRON HCL 4 MG/2ML IJ SOLN
4.0000 mg | Freq: Once | INTRAMUSCULAR | Status: AC
  Administered 2024-11-04: 4 mg via INTRAVENOUS
  Filled 2024-11-04: qty 2

## 2024-11-04 MED ORDER — DEXAMETHASONE SOD PHOSPHATE PF 10 MG/ML IJ SOLN
10.0000 mg | Freq: Once | INTRAMUSCULAR | Status: AC
  Administered 2024-11-04: 10 mg via INTRAVENOUS

## 2024-11-04 NOTE — Discharge Instructions (Signed)
 Take your insulin  as prescribed.  You will need to dose yourself when you get home Your glucose at discharge is 450 Return emergency department if worsening

## 2024-11-04 NOTE — ED Provider Notes (Signed)
 [facility] Provider Note    Event Date/Time   First MD Initiated Contact with Patient 11/04/24 1337     (approximate)   History   Headache   HPI  Jane Morrison is a 63 y.o. female history of diabetes, hyperlipidemia, hypertension, CHF presents emergency department complaint of headache since last night.  Patient states some dizziness and blurred vision.  States after she eats she will feel little better and the blurred vision and dizziness dissipate.  Patient also has chronic vaginal discharge and has been treated here several times.  States that she used the cream that gave her last time and now continues to have vaginal discharge.  Denies fever or chills.  No slurred speech.  No weakness.      Physical Exam   Triage Vital Signs: ED Triage Vitals  Encounter Vitals Group     BP 11/04/24 1156 (!) 165/94     Girls Systolic BP Percentile --      Girls Diastolic BP Percentile --      Boys Systolic BP Percentile --      Boys Diastolic BP Percentile --      Pulse Rate 11/04/24 1156 69     Resp 11/04/24 1156 16     Temp 11/04/24 1156 98.1 F (36.7 C)     Temp Source 11/04/24 1156 Oral     SpO2 11/04/24 1156 98 %     Weight 11/04/24 1157 160 lb 15 oz (73 kg)     Height --      Head Circumference --      Peak Flow --      Pain Score 11/04/24 1156 5     Pain Loc --      Pain Education --      Exclude from Growth Chart --     Most recent vital signs: Vitals:   11/04/24 1409 11/04/24 1610  BP:  (!) 207/101  Pulse:  63  Resp:  18  Temp:  98.7 F (37.1 C)  SpO2: 98% 93%     General: Awake, no distress.   CV:  Good peripheral perfusion Resp:  Normal effort.  Abd:  No distention.   Other:  Cranial nerves II through XII grossly intact, no slurred speech noted, grips equal bilaterally   ED Results / Procedures / Treatments   Labs (all labs ordered are listed, but only abnormal results are displayed) Labs Reviewed  BASIC METABOLIC PANEL  WITH GFR - Abnormal; Notable for the following components:      Result Value   Sodium 133 (*)    Chloride 94 (*)    Glucose, Bld 739 (*)    Creatinine, Ser 1.20 (*)    GFR, Estimated 51 (*)    All other components within normal limits  CBG MONITORING, ED - Abnormal; Notable for the following components:   Glucose-Capillary 459 (*)    All other components within normal limits  WET PREP, GENITAL  CBC WITH DIFFERENTIAL/PLATELET     EKG     RADIOLOGY CT head    PROCEDURES:   .Critical Care  Performed by: Gasper Devere ORN, PA-C Authorized by: Gasper Devere ORN, PA-C   Critical care provider statement:    Critical care time (minutes):  30   Critical care was necessary to treat or prevent imminent or life-threatening deterioration of the following conditions:  Endocrine crisis   Critical care was time spent personally by me on the following activities:  Development of  treatment plan with patient or surrogate, discussions with consultants, evaluation of patient's response to treatment, examination of patient, ordering and review of laboratory studies, ordering and review of radiographic studies, ordering and performing treatments and interventions, pulse oximetry, re-evaluation of patient's condition and review of old charts   I assumed direction of critical care for this patient from another provider in my specialty: no     Critical Care:  yes Chief Complaint  Patient presents with   Headache      MEDICATIONS ORDERED IN ED: Medications  sodium chloride  0.9 % bolus 1,000 mL (1,000 mLs Intravenous New Bag/Given 11/04/24 1419)  ondansetron  (ZOFRAN ) injection 4 mg (4 mg Intravenous Given 11/04/24 1420)  dexamethasone  (DECADRON ) injection 10 mg (10 mg Intravenous Given 11/04/24 1421)  ketorolac  (TORADOL ) 15 MG/ML injection 15 mg (15 mg Intravenous Given 11/04/24 1422)  insulin  aspart (novoLOG ) injection 10 Units (10 Units Intravenous Given 11/04/24 1636)     IMPRESSION /  MDM / ASSESSMENT AND PLAN / ED COURSE  I reviewed the triage vital signs and the nursing notes.                              Differential diagnosis includes, but is not limited to, CVA, SAH, migraine, hypo/hyperglycemia, dehydration  Patient's presentation is most consistent with acute illness / injury with system symptoms.   Medications given: Normal saline 1 L IV, Decadron  10 mg IV, Toradol  15 mg IV, Zofran  4 mg IV  Basic labs ordered, CT head ordered Patient is feeling improved after her medications  CT of the head independently reviewed interpreted by me as being negative for any acute abnormality shows chronic small vessel ischemic change   Metabolic shows a glucose of 739  Discussed with Dr. Arlander, patient be given insulin  subcu, additional liter of fluids, CBG check  On recheck of the patient, CBG had decreased to 450, blood pressure had also improved.  I did consider admission, however the patient has chronic high levels of glucose with her diabetes and would like to go home..  She is noncompliant with her medication at this time.  I did send a prescription to help her be compliant.  Strict instructions to return emergency department if worsening Did discuss taking her medications on a regular basis as prescribed.  Sent in refills for her insulin .  She is to follow-up with her regular doctor.  .  She is in agreement treatment plan.  Discharged stable condition.  FINAL CLINICAL IMPRESSION(S) / ED DIAGNOSES   Final diagnoses:  Hyperglycemia  Bad headache     Rx / DC Orders   ED Discharge Orders          Ordered    insulin  degludec (TRESIBA  FLEXTOUCH) 100 UNIT/ML FlexTouch Pen  Daily        11/04/24 1724    metFORMIN  (GLUCOPHAGE ) 500 MG tablet  2 times daily with meals        11/04/24 1724             Note:  This document was prepared using Dragon voice recognition software and may include unintentional dictation errors.    Gasper Devere ORN, PA-C 11/04/24  1738    Arlander [place], MD 11/04/24 [phone]

## 2024-11-04 NOTE — ED Triage Notes (Signed)
 C/O headache since last night. Also c/o persistent vaginal discharge (has been seen and treated for same through ED)  AAOx3. Skin warm and dry. NAD.

## 2024-11-27 ENCOUNTER — Inpatient Hospital Stay
Admission: EM | Admit: 2024-11-27 | Discharge: 2024-11-29 | DRG: 638 | Disposition: A | Discharge status: Home / Self Care | Payer: MEDICAID | Attending: Internal Medicine | Admitting: Internal Medicine

## 2024-11-27 ENCOUNTER — Other Ambulatory Visit: Payer: Self-pay

## 2024-11-27 ENCOUNTER — Emergency Department: Payer: Self-pay

## 2024-11-27 DIAGNOSIS — R42 Dizziness and giddiness: Secondary | ICD-10-CM

## 2024-11-27 DIAGNOSIS — Z7982 Long term (current) use of aspirin: Secondary | ICD-10-CM

## 2024-11-27 DIAGNOSIS — E1100 Type 2 diabetes mellitus with hyperosmolarity without nonketotic hyperglycemic-hyperosmolar coma (NKHHC): Principal | ICD-10-CM | POA: Diagnosis present

## 2024-11-27 DIAGNOSIS — R739 Hyperglycemia, unspecified: Principal | ICD-10-CM

## 2024-11-27 DIAGNOSIS — F419 Anxiety disorder, unspecified: Secondary | ICD-10-CM | POA: Diagnosis present

## 2024-11-27 DIAGNOSIS — Z79899 Other long term (current) drug therapy: Secondary | ICD-10-CM

## 2024-11-27 DIAGNOSIS — I5022 Chronic systolic (congestive) heart failure: Secondary | ICD-10-CM | POA: Diagnosis present

## 2024-11-27 DIAGNOSIS — I160 Hypertensive urgency: Secondary | ICD-10-CM | POA: Diagnosis present

## 2024-11-27 DIAGNOSIS — Z5971 Insufficient health insurance coverage: Secondary | ICD-10-CM

## 2024-11-27 DIAGNOSIS — I110 Hypertensive heart disease with heart failure: Secondary | ICD-10-CM | POA: Diagnosis present

## 2024-11-27 DIAGNOSIS — Z833 Family history of diabetes mellitus: Secondary | ICD-10-CM

## 2024-11-27 DIAGNOSIS — E785 Hyperlipidemia, unspecified: Secondary | ICD-10-CM

## 2024-11-27 DIAGNOSIS — R269 Unspecified abnormalities of gait and mobility: Secondary | ICD-10-CM | POA: Diagnosis present

## 2024-11-27 DIAGNOSIS — Z8249 Family history of ischemic heart disease and other diseases of the circulatory system: Secondary | ICD-10-CM

## 2024-11-27 DIAGNOSIS — E1165 Type 2 diabetes mellitus with hyperglycemia: Secondary | ICD-10-CM

## 2024-11-27 DIAGNOSIS — Z87891 Personal history of nicotine dependence: Secondary | ICD-10-CM

## 2024-11-27 DIAGNOSIS — Z794 Long term (current) use of insulin: Secondary | ICD-10-CM

## 2024-11-27 DIAGNOSIS — T383X6A Underdosing of insulin and oral hypoglycemic [antidiabetic] drugs, initial encounter: Secondary | ICD-10-CM | POA: Diagnosis present

## 2024-11-27 DIAGNOSIS — E871 Hypo-osmolality and hyponatremia: Secondary | ICD-10-CM

## 2024-11-27 DIAGNOSIS — Z7984 Long term (current) use of oral hypoglycemic drugs: Secondary | ICD-10-CM

## 2024-11-27 DIAGNOSIS — E878 Other disorders of electrolyte and fluid balance, not elsewhere classified: Secondary | ICD-10-CM | POA: Diagnosis present

## 2024-11-27 DIAGNOSIS — F32A Depression, unspecified: Secondary | ICD-10-CM | POA: Diagnosis present

## 2024-11-27 DIAGNOSIS — Z7985 Long-term (current) use of injectable non-insulin antidiabetic drugs: Secondary | ICD-10-CM

## 2024-11-27 DIAGNOSIS — Z91141 Patient's other noncompliance with medication regimen due to financial hardship: Secondary | ICD-10-CM

## 2024-11-27 LAB — CBC WITH DIFFERENTIAL/PLATELET
Abs Immature Granulocytes: 0.02 K/uL (ref 0.00–0.07)
Basophils Absolute: 0 K/uL (ref 0.0–0.1)
Basophils Relative: 1 %
Eosinophils Absolute: 0 K/uL (ref 0.0–0.5)
Eosinophils Relative: 1 %
HCT: 36.6 % (ref 36.0–46.0)
Hemoglobin: 12.5 g/dL (ref 12.0–15.0)
Immature Granulocytes: 0 %
Lymphocytes Relative: 37 %
Lymphs Abs: 2.2 K/uL (ref 0.7–4.0)
MCH: 29.7 pg (ref 26.0–34.0)
MCHC: 34.2 g/dL (ref 30.0–36.0)
MCV: 86.9 fL (ref 80.0–100.0)
Monocytes Absolute: 0.6 K/uL (ref 0.1–1.0)
Monocytes Relative: 10 %
Neutro Abs: 3 K/uL (ref 1.7–7.7)
Neutrophils Relative %: 51 %
Platelets: 268 K/uL (ref 150–400)
RBC: 4.21 MIL/uL (ref 3.87–5.11)
RDW: 12.1 % (ref 11.5–15.5)
WBC: 5.9 K/uL (ref 4.0–10.5)
nRBC: 0 % (ref 0.0–0.2)

## 2024-11-27 LAB — BASIC METABOLIC PANEL WITH GFR
Anion gap: 16 — ABNORMAL HIGH (ref 5–15)
Anion gap: 13 (ref 5–15)
BUN: 13 mg/dL (ref 8–23)
BUN: 9 mg/dL (ref 8–23)
CO2: 21 mmol/L — ABNORMAL LOW (ref 22–32)
CO2: 21 mmol/L — ABNORMAL LOW (ref 22–32)
Calcium: 9.3 mg/dL (ref 8.9–10.3)
Calcium: 9.2 mg/dL (ref 8.9–10.3)
Chloride: 88 mmol/L — ABNORMAL LOW (ref 98–111)
Chloride: 99 mmol/L (ref 98–111)
Creatinine, Ser: 1.25 mg/dL — ABNORMAL HIGH (ref 0.44–1.00)
Creatinine, Ser: 0.79 mg/dL (ref 0.44–1.00)
GFR, Estimated: 49 mL/min — ABNORMAL LOW (ref >60)
GFR, Estimated: >60 mL/min (ref >60)
Glucose, Bld: 794 mg/dL (ref 70–99)
Glucose, Bld: 418 mg/dL — ABNORMAL HIGH (ref 70–99)
Potassium: 4.4 mmol/L (ref 3.5–5.1)
Potassium: 3.7 mmol/L (ref 3.5–5.1)
Sodium: 124 mmol/L — ABNORMAL LOW (ref 135–145)
Sodium: 133 mmol/L — ABNORMAL LOW (ref 135–145)

## 2024-11-27 LAB — CBG MONITORING, ED
Glucose-Capillary: 220 mg/dL — ABNORMAL HIGH (ref 70–99)
Glucose-Capillary: 336 mg/dL — ABNORMAL HIGH (ref 70–99)
Glucose-Capillary: 373 mg/dL — ABNORMAL HIGH (ref 70–99)
Glucose-Capillary: 511 mg/dL (ref 70–99)

## 2024-11-27 LAB — URINALYSIS, W/ REFLEX TO CULTURE (INFECTION SUSPECTED)
Bilirubin Urine: NEGATIVE
Glucose, UA: >=500 mg/dL — AB
Ketones, ur: NEGATIVE mg/dL
Nitrite: POSITIVE — AB
Protein, ur: NEGATIVE mg/dL
Specific Gravity, Urine: 1.022 (ref 1.005–1.030)
WBC, UA: >50 WBC/hpf (ref 0–5)
pH: 7 (ref 5.0–8.0)

## 2024-11-27 LAB — BETA-HYDROXYBUTYRIC ACID: Beta-Hydroxybutyric Acid: 0.15 mmol/L (ref 0.05–0.27)

## 2024-11-27 LAB — TROPONIN T, HIGH SENSITIVITY: Troponin T High Sensitivity: <15 ng/L (ref 0–19)

## 2024-11-27 LAB — OSMOLALITY: Osmolality: 299 mosm/kg — ABNORMAL HIGH (ref 275–295)

## 2024-11-27 MED ORDER — HYDRALAZINE HCL 20 MG/ML IJ SOLN: 10.0000 mg | Freq: Four times a day (QID) | INTRAMUSCULAR | Status: DC | PRN

## 2024-11-27 MED ORDER — LACTATED RINGERS IV BOLUS
20.0000 mL/kg | Freq: Once | INTRAVENOUS | Status: AC
  Administered 2024-11-27: 21:00:00 1360 mL via INTRAVENOUS

## 2024-11-27 MED ORDER — INSULIN REGULAR(HUMAN) IN NACL 100-0.9 UT/100ML-% IV SOLN
INTRAVENOUS | Status: DC
  Administered 2024-11-27: 22:00:00 10 [IU]/h via INTRAVENOUS
  Filled 2024-11-27: qty 100

## 2024-11-27 MED ORDER — MAGNESIUM HYDROXIDE 400 MG/5ML PO SUSP: 30.0000 mL | Freq: Every day | ORAL | Status: DC | PRN

## 2024-11-27 MED ORDER — ONDANSETRON HCL 4 MG PO TABS: 4.0000 mg | ORAL_TABLET | Freq: Four times a day (QID) | ORAL | Status: DC | PRN

## 2024-11-27 MED ORDER — LACTATED RINGERS IV SOLN: INTRAVENOUS | Status: AC

## 2024-11-27 MED ORDER — INSULIN DEGLUDEC 100 UNIT/ML ~~LOC~~ SOPN: 16.0000 [IU] | PEN_INJECTOR | Freq: Every day | SUBCUTANEOUS | Status: DC

## 2024-11-27 MED ORDER — SODIUM CHLORIDE 0.9 % IV BOLUS
1000.0000 mL | Freq: Once | INTRAVENOUS | Status: AC
  Administered 2024-11-27: 18:00:00 1000 mL via INTRAVENOUS

## 2024-11-27 MED ORDER — TRAZODONE HCL 50 MG PO TABS: 25.0000 mg | ORAL_TABLET | Freq: Every evening | ORAL | Status: DC | PRN

## 2024-11-27 MED ORDER — ACETAMINOPHEN 650 MG RE SUPP: 650.0000 mg | Freq: Four times a day (QID) | RECTAL | Status: DC | PRN

## 2024-11-27 MED ORDER — ENOXAPARIN SODIUM 40 MG/0.4ML IJ SOSY
40.0000 mg | PREFILLED_SYRINGE | INTRAMUSCULAR | Status: DC
  Administered 2024-11-27 – 2024-11-28 (×2): 40 mg via SUBCUTANEOUS
  Filled 2024-11-27 (×2): qty 0.4

## 2024-11-27 MED ORDER — DEXTROSE IN LACTATED RINGERS 5 % IV SOLN: INTRAVENOUS | Status: DC

## 2024-11-27 MED ORDER — ASPIRIN 81 MG PO CHEW
81.0000 mg | CHEWABLE_TABLET | Freq: Every day | ORAL | Status: DC
  Administered 2024-11-27 – 2024-11-29 (×3): 81 mg via ORAL
  Filled 2024-11-27 (×3): qty 1

## 2024-11-27 MED ORDER — ONDANSETRON HCL 4 MG/2ML IJ SOLN: 4.0000 mg | Freq: Four times a day (QID) | INTRAMUSCULAR | Status: DC | PRN

## 2024-11-27 MED ORDER — ACETAMINOPHEN 325 MG PO TABS
650.0000 mg | ORAL_TABLET | Freq: Four times a day (QID) | ORAL | Status: DC | PRN
  Administered 2024-11-28: 14:00:00 650 mg via ORAL
  Filled 2024-11-27: qty 2

## 2024-11-27 MED ORDER — DEXTROSE 50 % IV SOLN: 0.0000 mL | INTRAVENOUS | Status: DC | PRN

## 2024-11-27 MED ORDER — LABETALOL HCL 5 MG/ML IV SOLN: 20.0000 mg | INTRAVENOUS | Status: DC | PRN

## 2024-11-27 MED ORDER — SPIRONOLACTONE 25 MG PO TABS
25.0000 mg | ORAL_TABLET | Freq: Every day | ORAL | Status: DC
  Administered 2024-11-28 – 2024-11-29 (×2): 25 mg via ORAL
  Filled 2024-11-27 (×2): qty 1

## 2024-11-27 MED ORDER — INSULIN ASPART 100 UNIT/ML IJ SOLN
7.0000 [IU] | Freq: Once | INTRAMUSCULAR | Status: AC
  Administered 2024-11-27: 18:00:00 7 [IU] via INTRAVENOUS
  Filled 2024-11-27: qty 7

## 2024-11-27 MED ORDER — MECLIZINE HCL 25 MG PO TABS: 12.5000 mg | ORAL_TABLET | Freq: Three times a day (TID) | ORAL | Status: DC | PRN

## 2024-11-27 MED ORDER — CARVEDILOL 25 MG PO TABS
25.0000 mg | ORAL_TABLET | Freq: Two times a day (BID) | ORAL | Status: DC
  Administered 2024-11-27 – 2024-11-29 (×4): 25 mg via ORAL
  Filled 2024-11-27 (×4): qty 1

## 2024-11-27 MED ORDER — POTASSIUM CHLORIDE 10 MEQ/100ML IV SOLN
10.0000 meq | INTRAVENOUS | Status: AC
  Administered 2024-11-27 (×2): 10 meq via INTRAVENOUS
  Filled 2024-11-27 (×2): qty 100

## 2024-11-27 MED ORDER — ROSUVASTATIN CALCIUM 10 MG PO TABS
20.0000 mg | ORAL_TABLET | Freq: Every day | ORAL | Status: DC
  Administered 2024-11-28 – 2024-11-29 (×2): 20 mg via ORAL
  Filled 2024-11-27: qty 2
  Filled 2024-11-27: qty 1

## 2024-11-27 NOTE — Assessment & Plan Note (Signed)
 Will continue statin therapy

## 2024-11-27 NOTE — ED Notes (Signed)
 Patient out of room at MRI during rounding. Water/ice to family member.

## 2024-11-27 NOTE — Assessment & Plan Note (Signed)
-   Will continue Coreg  and Entresto  as well as Aldactone .

## 2024-11-27 NOTE — Assessment & Plan Note (Signed)
-   This is clearly pseudohyponatremia from hyperglycemia. - Will follow sodium level with current management.

## 2024-11-27 NOTE — ED Provider Notes (Signed)
 [facility] Provider Note    Event Date/Time   First MD Initiated Contact with Patient 11/27/24 1726     (approximate)   History   Dizziness   HPI  Jane Morrison is a 63 y.o. female with history of CHF, diabetes, hypertension, hyperlipidemia, presenting with lightheadedness and headache.  States that the headache was frontal, has improved now.  Has been noted intermittent lightheadedness/dizziness for the past month.  States that she feels off balance with walking.  Denies any focal weakness or numbness, no slurred speech or vision changes.  States that no prior history of stroke, no recent falls or trauma.  States that she ran out of her insulin  and is unable to pay for insulin  so have been off insulin  for the last month.  No nausea vomiting or diarrhea, no chest pain or shortness of breath.  On independent chart review, she was seen previously for lightheadedness and headache, this was in late November, was also found to have glucoses there are in the 700s.  She had reported chronically high levels and has been noncompliant with her medications.     Physical Exam   Triage Vital Signs: ED Triage Vitals  Encounter Vitals Group     BP 11/27/24 1557 (!) 139/102     Girls Systolic BP Percentile --      Girls Diastolic BP Percentile --      Boys Systolic BP Percentile --      Boys Diastolic BP Percentile --      Pulse Rate 11/27/24 1557 78     Resp 11/27/24 1557 17     Temp 11/27/24 1557 97.8 F (36.6 C)     Temp Source 11/27/24 1557 Oral     SpO2 11/27/24 1557 99 %     Weight 11/27/24 1601 150 lb (68 kg)     Height 11/27/24 1601 5' 8 (1.727 m)     Head Circumference --      Peak Flow --      Pain Score 11/27/24 1558 5     Pain Loc --      Pain Education --      Exclude from Growth Chart --     Most recent vital signs: Vitals:   11/27/24 1557  BP: (!) 139/102  Pulse: 78  Resp: 17  Temp: 97.8 F (36.6 C)  SpO2: 99%      General: Awake, no distress.  CV:  Good peripheral perfusion.  Resp:  Normal effort.  No tachypnea or respiratory distress Abd:  No distention.  Soft nontender Other:  Pupils are equal, no current nerve deficits, no focal weakness or numbness, no dysmetria, she does have some unsteadiness with walking.   ED Results / Procedures / Treatments   Labs (all labs ordered are listed, but only abnormal results are displayed) Labs Reviewed  BASIC METABOLIC PANEL WITH GFR - Abnormal; Notable for the following components:      Result Value   Sodium 124 (*)    Chloride 88 (*)    CO2 21 (*)    Glucose, Bld 794 (*)    Creatinine, Ser 1.25 (*)    GFR, Estimated 49 (*)    Anion gap 16 (*)    All other components within normal limits  CBG MONITORING, ED - Abnormal; Notable for the following components:   Glucose-Capillary 511 (*)    All other components within normal limits  CBC WITH DIFFERENTIAL/PLATELET  BETA-HYDROXYBUTYRIC ACID  URINALYSIS, W/  REFLEX TO CULTURE (INFECTION SUSPECTED)  TROPONIN T, HIGH SENSITIVITY     EKG  EKG shows, sinus rhythm, rate 70, normal QS, normal QTc, no obvious ischemic ST elevation, T wave flattening in 1, aVL, not significantly changed compared to prior   RADIOLOGY On my independent interpretation, MRI without acute stroke   PROCEDURES:  Critical Care performed: Yes, see critical care procedure note(s)  .Critical Care  Performed by: Waymond Lorelle Cummins, MD Authorized by: Waymond Lorelle Cummins, MD   Critical care provider statement:    Critical care time (minutes):  40   Critical care was time spent personally by me on the following activities:  Development of treatment plan with patient or surrogate, discussions with consultants, evaluation of patient's response to treatment, examination of patient, ordering and review of laboratory studies, ordering and review of radiographic studies, ordering and performing treatments and interventions, pulse oximetry,  re-evaluation of patient's condition and review of old charts    MEDICATIONS ORDERED IN ED: Medications  sodium chloride  0.9 % bolus 1,000 mL (0 mLs Intravenous Stopped 11/27/24 1944)  insulin  aspart (novoLOG ) injection 7 Units (7 Units Intravenous Given 11/27/24 1811)     IMPRESSION / MDM / ASSESSMENT AND PLAN / ED COURSE  I reviewed the triage vital signs and the nursing notes.                              Differential diagnosis includes, but is not limited to, electrolyte derangements, dehydration, hyperglycemia, DKA, atypical ACS, arrhythmia, CVA.  Stroke alert was not activated given that patient states that her off balance has been going on for months.  Will get labs, UA, MRI brain, troponin, IV fluids, insulin .  She will likely need to be admitted for further management.  Patient's presentation is most consistent with acute presentation with potential threat to life or bodily function.  Independent interpretation of labs and imaging below.  Labs notable for profound hyperglycemia, patient is not altered, doubt HHS, beta-hydroxybutyrate is not elevated, doubt DKA at this time.  Given her profound hyperglycemia and inability to access insulin , she will need to be admitted for further management.  Gave her a fluid bolus as well as insulin  bolus.  Discussed with hospitalist and they will start her on insulin  drip as well.  She is admitted.    Clinical Course as of 11/27/24 2001  Wed Nov 27, 2024  1916 Independent review of labs, no leukocytosis, hyponatremia likely secondary to elevated glucose, she is hyperglycemic, bicarb is 21, her anion gap is 16, we will send a beta hydroxybutyrate, troponins not elevated. [TT]  1927 Beta-Hydroxybutyric Acid: 0.15 Not elevated [TT]  2000 Troponin T High Sensitivity: <15 [TT]    Clinical Course User Index [TT] Waymond Lorelle Cummins, MD     FINAL CLINICAL IMPRESSION(S) / ED DIAGNOSES   Final diagnoses:  Lightheadedness  Dizziness  Hyperglycemia      Rx / DC Orders   ED Discharge Orders     None        Note:  This document was prepared using Dragon voice recognition software and may include unintentional dictation errors.    Waymond Lorelle Cummins, MD 11/27/24 [phone]

## 2024-11-27 NOTE — H&P (Signed)
 [place]   PATIENT NAME: Jane Morrison    MR#:  [id number]  DATE OF BIRTH:  12/24/1960  DATE OF ADMISSION:  11/27/2024  PRIMARY CARE PHYSICIAN: Gareth Mliss FALCON, FNP   Patient is coming from: Home  REQUESTING/REFERRING PHYSICIAN: Waymond Lorelle Cummins, MD   CHIEF COMPLAINT:   Chief Complaint  Patient presents with   Dizziness    HISTORY OF PRESENT ILLNESS:  Jane Morrison is a 63 y.o. African-American female with medical history significant for anxiety, depression, CHF, type 2 diabetes mellitus, hypertension and dyslipidemia, who presented to the emergency room with acute onset of lightheadedness, dizziness and headache mainly in the frontal area.  She has been having intermittent dizziness over the last month.  This is associated with gait abnormality with imbalance.  She denies any focal paresthesias or muscle weakness.  No dysphagia or dysarthria.  No tinnitus or vertigo.  No recent falls or trauma.  She admitted to running out of her insulin  and stated that she has been been unable to afford the last month.  No nausea or vomiting or diarrhea.  No dysuria, oliguria or hematuria or flank pain.  She admits to polyuria and polydipsia.  No bleeding diathesis.  ED Course: When the patient came to the ER, BP was 139/102 with otherwise normal vital signs.  Labs revealed hyponatremia of 124 and hypochloremia of 88 with a CO2 21, blood glucose of 794 and anion gap of 16 with creatinine 1.25.  High-sensitivity troponin was less than 15 CBC was within normal.  Beta-hydroxybutyrate was normal at 0.15. EKG as reviewed by me : 12-lead EKG revealed normal sinus rhythm at a rate of 70 with possible left atrial enlargement, left axis deviation and minimal voltage criteria for LVH. Imaging: MRI of the brain without contrast revealed moderately advanced periventricular and subcortical T2 hyperdensities for age likely the sequelae of chronic microvascular ischemia.  The patient was given 1 L bolus  of IV normal saline.  She will be admitted to a stepdown unit bed for further evaluation and management. PAST MEDICAL HISTORY:   Past Medical History:  Diagnosis Date   Anxiety    CHF (congestive heart failure) (HCC)    was taken off lasix    Depression    Diabetes mellitus without complication (HCC)    Hyperlipidemia    Hypertension     PAST SURGICAL HISTORY:   Past Surgical History:  Procedure Laterality Date   COLONOSCOPY WITH PROPOFOL  N/A 06/29/2022   Procedure: COLONOSCOPY WITH PROPOFOL ;  Surgeon: Therisa Bi, MD;  Location: [facility] ENDOSCOPY;  Service: Gastroenterology;  Laterality: N/A;   RIGHT/LEFT HEART CATH AND CORONARY ANGIOGRAPHY N/A 04/03/2023   Procedure: RIGHT/LEFT HEART CATH AND CORONARY ANGIOGRAPHY;  Surgeon: Ammon Blunt, MD;  Location: [facility] INVASIVE CV LAB;  Service: Cardiovascular;  Laterality: N/A;   TUBAL LIGATION      SOCIAL HISTORY:   Social History   Tobacco Use   Smoking status: Former    Current packs/day: 0.00    Average packs/day: 0.5 packs/day for 40.0 years (20.0 ttl pk-yrs)    Types: Cigarettes    Start date: 03/29/1983    Quit date: 03/29/2023    Years since quitting: 1.6    Passive exposure: Past   Smokeless tobacco: Never  Substance Use Topics   Alcohol use: No    FAMILY HISTORY:   Family History  Problem Relation Age of Onset   Hypertension Mother    Diabetes Mother    Hypertension Father  Diabetes Father    Breast cancer Sister 39    DRUG ALLERGIES:  Allergies[1]  REVIEW OF SYSTEMS:   ROS As per history of present illness. All pertinent systems were reviewed above. Constitutional, HEENT, cardiovascular, respiratory, GI, GU, musculoskeletal, neuro, psychiatric, endocrine, integumentary and hematologic systems were reviewed and are otherwise negative/unremarkable except for positive findings mentioned above in the HPI.   MEDICATIONS AT HOME:   Prior to Admission medications  Medication Sig Start Date End Date  Taking? Authorizing Provider  aspirin  81 MG chewable tablet Chew 1 tablet (81 mg total) by mouth daily. 04/04/23   Caleen Qualia, MD  blood glucose meter kit and supplies KIT Dispense based on patient and insurance preference. Use up to four times daily as directed. (FOR ICD-9 250.00, 250.01). 06/15/22   Gareth Mliss FALCON, FNP  Blood Glucose Monitoring Suppl (ONETOUCH VERIO FLEX SYSTEM) w/Device KIT Use to check blood sugar up to four times daily as directed 01/17/24   Pender, Julie F, FNP  carvedilol  (COREG ) 25 MG tablet Take 1 tablet (25 mg total) by mouth 2 (two) times daily. 03/13/24 07/31/24  Donette Ellouise LABOR, FNP  Dulaglutide  (TRULICITY ) 1.5 MG/0.5ML SOAJ Inject 1.5 mg into the skin once a week. Patient not taking: Reported on 07/31/2024 03/13/24   Gareth Mliss FALCON, FNP  Glucagon  (GVOKE HYPOPEN  1-PACK) 0.5 MG/0.1ML SOAJ Inject 0.5 mg into the skin once as needed for up to 1 dose (for hypoglycemia). 06/15/22   Gareth Mliss FALCON, FNP  glucose blood (ONETOUCH VERIO) test strip Use to check blood sugar up to four times daily as directed 01/17/24   Pender, Julie F, FNP  insulin  degludec (TRESIBA  FLEXTOUCH) 100 UNIT/ML FlexTouch Pen Inject 16 Units into the skin daily. 11/04/24   Gasper Devere ORN, PA-C  Insulin  Pen Needle (BD PEN NEEDLE MICRO U/F) 32G X 6 MM MISC Use to administer insulin  03/05/24   Pender, Julie F, FNP  Lancets [facility] DELICA PLUS [place]) MISC Use to check blood sugar up to four times daily as directed 01/17/24   Pender, Julie F, FNP  mebendazole  (VERMOX ) 100 MG chewable tablet Take 1 tablet and then repeat in 2 weeks 07/31/24   Pender, Julie F, FNP  meclizine  (ANTIVERT ) 25 MG tablet Take 1 tablet (25 mg total) by mouth 3 (three) times daily as needed. 08/10/24   Siadecki, Sebastian, MD  metFORMIN  (GLUCOPHAGE ) 500 MG tablet Take 1 tablet (500 mg total) by mouth 2 (two) times daily with a meal. 11/04/24   Fisher, Devere ORN, PA-C  rosuvastatin  (CRESTOR ) 20 MG tablet Take 1 tablet (20 mg total) by mouth daily.  08/06/24   Donette Ellouise LABOR, FNP  sacubitril -valsartan  (ENTRESTO ) 97-103 MG Take 1 tablet by mouth 2 (two) times daily. Patient not taking: Reported on 07/31/2024 01/17/24   Donette Ellouise LABOR, FNP  spironolactone  (ALDACTONE ) 25 MG tablet TAKE 1 TABLET (25 MG TOTAL) BY MOUTH DAILY. 09/12/24   Donette Ellouise LABOR, FNP      VITAL SIGNS:  Blood pressure (!) 139/102, pulse 78, temperature 97.8 F (36.6 C), temperature source Oral, resp. rate 17, height 5' 8 (1.727 m), weight 68 kg, SpO2 99%.  PHYSICAL EXAMINATION:  Physical Exam  GENERAL:  63 y.o.-year-old African-American female patient lying in the bed with no acute distress.  EYES: Pupils equal, round, reactive to light and accommodation. No scleral icterus. Extraocular muscles intact.  HEENT: Head atraumatic, normocephalic. Oropharynx and nasopharynx clear.  NECK:  Supple, no jugular venous distention. No thyroid  enlargement, no tenderness.  LUNGS: Normal breath sounds bilaterally, no wheezing, rales,rhonchi or crepitation. No use of accessory muscles of respiration.  CARDIOVASCULAR: Regular rate and rhythm, S1, S2 normal. No murmurs, rubs, or gallops.  ABDOMEN: Soft, nondistended, nontender. Bowel sounds present. No organomegaly or mass.  EXTREMITIES: No pedal edema, cyanosis, or clubbing.  NEUROLOGIC: Cranial nerves II through XII are intact. Muscle strength 5/5 in all extremities. Sensation intact. Gait not checked.  PSYCHIATRIC: The patient is alert and oriented x 3.  Normal affect and good eye contact. SKIN: No obvious rash, lesion, or ulcer.   LABORATORY PANEL:   CBC Recent Labs  Lab 11/27/24 1604  WBC 5.9  HGB 12.5  HCT 36.6  PLT 268   ------------------------------------------------------------------------------------------------------------------  Chemistries  Recent Labs  Lab 11/27/24 1604  NA 124*  K 4.4  CL 88*  CO2 21*  GLUCOSE 794*  BUN 13  CREATININE 1.25*  CALCIUM  9.3    ------------------------------------------------------------------------------------------------------------------  Cardiac Enzymes No results for input(s): TROPONINI in the last 168 hours. ------------------------------------------------------------------------------------------------------------------  RADIOLOGY:  MR BRAIN WO CONTRAST Result Date: 11/27/2024 EXAM: MRI BRAIN WITHOUT CONTRAST 11/27/2024 07:11:43 PM TECHNIQUE: Multiplanar multisequence MRI of the head/brain was performed without the administration of intravenous contrast. COMPARISON: None available. CLINICAL HISTORY: Neuro deficit, acute, stroke suspected. Dizziness and unsteady gait. FINDINGS: BRAIN AND VENTRICLES: No acute infarct. No intracranial hemorrhage. No mass. No midline shift. No hydrocephalus. Periventricular and subcortical T2 hyperintensities are moderately advanced for age. The sella is unremarkable. Normal flow voids. ORBITS: No acute abnormality. SINUSES AND MASTOIDS: No acute abnormality. BONES AND SOFT TISSUES: Normal marrow signal. No acute soft tissue abnormality. IMPRESSION: 1. No acute intracranial abnormality. 2. Moderately advanced periventricular and subcortical T2 hyperintensities for age. This most likely reflects the sequelae of chronic microvascular ischemia. Electronically signed by: Lonni Necessary MD 11/27/2024 07:22 PM EST RP Workstation: [id number]      IMPRESSION AND PLAN:  Assessment and Plan: * Uncontrolled type 2 diabetes mellitus with hyperosmolar nonketotic hyperglycemia (HCC) - The patient will be admitted to a stepdown bed. - We will continue the on IV insulin  drip per [facility] protocol. - The patient will be aggressively hydrated with IV LR. - Will follow serial BMPs.   Hypertensive urgency - Will continue antihypertensive therapy. - Will place the patient on as needed IV labetalol  and hydralazine .  Hyponatremia - This is clearly pseudohyponatremia from  hyperglycemia. - Will follow sodium level with current management.  Dyslipidemia - Will continue statin therapy.  Chronic HFrEF (heart failure with reduced ejection fraction) (HCC) - Will continue Coreg  and Entresto  as well as Aldactone .   DVT prophylaxis: Lovenox .  Advanced Care Planning:  Code Status: full code.  Family Communication:  The plan of care was discussed in details with the patient (and family). I answered all questions. The patient agreed to proceed with the above mentioned plan. Further management will depend upon hospital course. Disposition Plan: Back to previous home environment Consults called: none.  All the records are reviewed and case discussed with ED provider.  Status is: Inpatient.    At the time of the admission, it appears that the appropriate admission status for this patient is inpatient.  This is judged to be reasonable and necessary in order to provide the required intensity of service to ensure the patient's safety given the presenting symptoms, physical exam findings and initial radiographic and laboratory data in the context of comorbid conditions.  The patient requires inpatient status due to high intensity of service, high risk of further  deterioration and high frequency of surveillance required.  I certify that at the time of admission, it is my clinical judgment that the patient will require inpatient hospital care extending more than 2 midnights.                            Dispo: The patient is from: Home              Anticipated d/c is to: Home              Patient currently is not medically stable to d/c.              Difficult to place patient: No  Madison DELENA Peaches M.D on 11/27/2024 at 10:03 PM  [facility]   From 7 PM-7 AM, contact night-coverage [email or web address]  CC: Primary care physician; Gareth Mliss FALCON, FNP     [1] No Known Allergies

## 2024-11-27 NOTE — Assessment & Plan Note (Addendum)
-   The patient will be admitted to a stepdown bed. - We will continue the on IV insulin  drip per [facility] protocol. - The patient will be aggressively hydrated with IV LR. - Will follow serial BMPs.

## 2024-11-27 NOTE — Assessment & Plan Note (Signed)
-   Will continue antihypertensive therapy. - Will place the patient on as needed IV labetalol  and hydralazine .

## 2024-11-27 NOTE — ED Triage Notes (Signed)
 Pt to ED via POV for c/o dizziness and headache. Pt states she has felt lightheaded all day. Pt alert and oriented, NAD.

## 2024-11-28 ENCOUNTER — Other Ambulatory Visit (HOSPITAL_COMMUNITY): Payer: Self-pay

## 2024-11-28 ENCOUNTER — Other Ambulatory Visit: Payer: Self-pay

## 2024-11-28 LAB — BASIC METABOLIC PANEL WITH GFR
Anion gap: 10 (ref 5–15)
Anion gap: 10 (ref 5–15)
Anion gap: 9 (ref 5–15)
Anion gap: 9 (ref 5–15)
BUN: 8 mg/dL (ref 8–23)
BUN: 7 mg/dL — ABNORMAL LOW (ref 8–23)
BUN: 6 mg/dL — ABNORMAL LOW (ref 8–23)
BUN: <5 mg/dL — ABNORMAL LOW (ref 8–23)
CO2: 23 mmol/L (ref 22–32)
CO2: 25 mmol/L (ref 22–32)
CO2: 24 mmol/L (ref 22–32)
CO2: 24 mmol/L (ref 22–32)
Calcium: 8.9 mg/dL (ref 8.9–10.3)
Calcium: 8.6 mg/dL — ABNORMAL LOW (ref 8.9–10.3)
Calcium: 8.6 mg/dL — ABNORMAL LOW (ref 8.9–10.3)
Calcium: 8.7 mg/dL — ABNORMAL LOW (ref 8.9–10.3)
Chloride: 106 mmol/L (ref 98–111)
Chloride: 104 mmol/L (ref 98–111)
Chloride: 105 mmol/L (ref 98–111)
Chloride: 104 mmol/L (ref 98–111)
Creatinine, Ser: 0.79 mg/dL (ref 0.44–1.00)
Creatinine, Ser: 0.77 mg/dL (ref 0.44–1.00)
Creatinine, Ser: 0.77 mg/dL (ref 0.44–1.00)
Creatinine, Ser: 0.84 mg/dL (ref 0.44–1.00)
GFR, Estimated: >60 mL/min (ref >60)
GFR, Estimated: >60 mL/min (ref >60)
GFR, Estimated: >60 mL/min (ref >60)
GFR, Estimated: >60 mL/min (ref >60)
Glucose, Bld: 146 mg/dL — ABNORMAL HIGH (ref 70–99)
Glucose, Bld: 137 mg/dL — ABNORMAL HIGH (ref 70–99)
Glucose, Bld: 184 mg/dL — ABNORMAL HIGH (ref 70–99)
Glucose, Bld: 183 mg/dL — ABNORMAL HIGH (ref 70–99)
Potassium: 3.1 mmol/L — ABNORMAL LOW (ref 3.5–5.1)
Potassium: 3.4 mmol/L — ABNORMAL LOW (ref 3.5–5.1)
Potassium: 3.8 mmol/L (ref 3.5–5.1)
Potassium: 4 mmol/L (ref 3.5–5.1)
Sodium: 139 mmol/L (ref 135–145)
Sodium: 138 mmol/L (ref 135–145)
Sodium: 137 mmol/L (ref 135–145)
Sodium: 136 mmol/L (ref 135–145)

## 2024-11-28 LAB — CBG MONITORING, ED
Glucose-Capillary: 116 mg/dL — ABNORMAL HIGH (ref 70–99)
Glucose-Capillary: 130 mg/dL — ABNORMAL HIGH (ref 70–99)
Glucose-Capillary: 142 mg/dL — ABNORMAL HIGH (ref 70–99)
Glucose-Capillary: 153 mg/dL — ABNORMAL HIGH (ref 70–99)
Glucose-Capillary: 154 mg/dL — ABNORMAL HIGH (ref 70–99)
Glucose-Capillary: 180 mg/dL — ABNORMAL HIGH (ref 70–99)
Glucose-Capillary: 183 mg/dL — ABNORMAL HIGH (ref 70–99)
Glucose-Capillary: 184 mg/dL — ABNORMAL HIGH (ref 70–99)
Glucose-Capillary: 186 mg/dL — ABNORMAL HIGH (ref 70–99)
Glucose-Capillary: 189 mg/dL — ABNORMAL HIGH (ref 70–99)
Glucose-Capillary: 203 mg/dL — ABNORMAL HIGH (ref 70–99)

## 2024-11-28 LAB — CBC
HCT: 34.8 % — ABNORMAL LOW (ref 36.0–46.0)
Hemoglobin: 12.2 g/dL (ref 12.0–15.0)
MCH: 29.9 pg (ref 26.0–34.0)
MCHC: 35.1 g/dL (ref 30.0–36.0)
MCV: 85.3 fL (ref 80.0–100.0)
Platelets: 239 K/uL (ref 150–400)
RBC: 4.08 MIL/uL (ref 3.87–5.11)
RDW: 12 % (ref 11.5–15.5)
WBC: 7 K/uL (ref 4.0–10.5)
nRBC: 0 % (ref 0.0–0.2)

## 2024-11-28 LAB — GLUCOSE, CAPILLARY
Glucose-Capillary: 332 mg/dL — ABNORMAL HIGH (ref 70–99)
Glucose-Capillary: 366 mg/dL — ABNORMAL HIGH (ref 70–99)

## 2024-11-28 LAB — HEMOGLOBIN A1C: Hgb A1c MFr Bld: >18.5 % — ABNORMAL HIGH (ref 4.8–5.6)

## 2024-11-28 LAB — HIV ANTIBODY (ROUTINE TESTING W REFLEX): HIV Screen 4th Generation wRfx: NONREACTIVE

## 2024-11-28 MED ORDER — INSULIN ASPART 100 UNIT/ML IJ SOLN
0.0000 [IU] | Freq: Every day | INTRAMUSCULAR | Status: DC
  Administered 2024-11-28: 22:00:00 5 [IU] via SUBCUTANEOUS
  Filled 2024-11-28: qty 5

## 2024-11-28 MED ORDER — POTASSIUM CHLORIDE 10 MEQ/100ML IV SOLN
10.0000 meq | INTRAVENOUS | Status: AC
  Administered 2024-11-28 (×4): 10 meq via INTRAVENOUS
  Filled 2024-11-28 (×4): qty 100

## 2024-11-28 MED ORDER — INSULIN GLARGINE-YFGN 100 UNIT/ML ~~LOC~~ SOLN
10.0000 [IU] | Freq: Two times a day (BID) | SUBCUTANEOUS | Status: DC
  Administered 2024-11-28: 10:00:00 10 [IU] via SUBCUTANEOUS
  Filled 2024-11-28 (×2): qty 0.1

## 2024-11-28 MED ORDER — INSULIN GLARGINE 100 UNIT/ML ~~LOC~~ SOLN
10.0000 [IU] | Freq: Two times a day (BID) | SUBCUTANEOUS | Status: DC
  Administered 2024-11-28: 22:00:00 10 [IU] via SUBCUTANEOUS
  Filled 2024-11-28 (×2): qty 0.1

## 2024-11-28 MED ORDER — INSULIN ASPART 100 UNIT/ML IJ SOLN
0.0000 [IU] | Freq: Three times a day (TID) | INTRAMUSCULAR | Status: DC
  Administered 2024-11-28: 11:00:00 2 [IU] via SUBCUTANEOUS
  Administered 2024-11-28 – 2024-11-29 (×2): 7 [IU] via SUBCUTANEOUS
  Filled 2024-11-28 (×2): qty 7
  Filled 2024-11-28: qty 2

## 2024-11-28 NOTE — ED Notes (Signed)
 Insulin  drip d/c. Verbal confirmation from MD.

## 2024-11-28 NOTE — Progress Notes (Signed)
 Progress Note   Patient: Jane Morrison FMW:[id number] DOB: 10-Jun-1961 DOA: 11/27/2024     1 DOS: the patient was seen and examined on 11/28/2024   Brief hospital course: From HPI CHRISTOL THETFORD is a 63 y.o. African-American female with medical history significant for anxiety, depression, CHF, type 2 diabetes mellitus, hypertension and dyslipidemia, who presented to the emergency room with acute onset of lightheadedness, dizziness and headache mainly in the frontal area.  She has been having intermittent dizziness over the last month.  This is associated with gait abnormality with imbalance.  She denies any focal paresthesias or muscle weakness.  No dysphagia or dysarthria.  No tinnitus or vertigo.  No recent falls or trauma.  She admitted to running out of her insulin  and stated that she has been been unable to afford the last month.  No nausea or vomiting or diarrhea.  No dysuria, oliguria or hematuria or flank pain.  She admits to polyuria and polydipsia.  No bleeding diathesis.   ED Course: When the patient came to the ER, BP was 139/102 with otherwise normal vital signs.  Labs revealed hyponatremia of 124 and hypochloremia of 88 with a CO2 21, blood glucose of 794 and anion gap of 16 with creatinine 1.25.  High-sensitivity troponin was less than 15 CBC was within normal.  Beta-hydroxybutyrate was normal at 0.15. EKG as reviewed by me : 12-lead EKG revealed normal sinus rhythm at a rate of 70 with possible left atrial enlargement, left axis deviation and minimal voltage criteria for LVH. Imaging: MRI of the brain without contrast revealed moderately advanced periventricular and subcortical T2 hyperdensities for age likely the sequelae of chronic microvascular ischemia.   The patient was given 1 L bolus of IV normal saline.  She will be admitted to a stepdown unit bed for further evaluation and management.    Assessment and Plan: Uncontrolled type 2 diabetes mellitus with hyperosmolar  nonketotic hyperglycemia (HCC) Patient's glucose level improved IV insulin  transition to subcutaneous insulin  Dextrose  infusion discontinued Diet resumed We will monitor glucose level closely   Hypertensive urgency Continue home medication   Hyponatremia -Improved continue to monitor   Dyslipidemia - Will continue statin therapy.   Chronic HFrEF (heart failure with reduced ejection fraction) (HCC) Continue Coreg  and Entresto  as well as Aldactone .     DVT prophylaxis: Lovenox .  Advanced Care Planning:  Code Status: full code.  Disposition Plan: Back to previous home environment Consults called: none.   Subjective:  Patient seen and examined at bedside this morning Admits to improvement in her general condition Denies nausea vomiting abdominal pain chest pain cough Glucose level improved so IV insulin  been transitioned to subcutaneous insulin   Physical Exam:   GENERAL:  63 y.o.-year-old African-American female patient lying in the bed with no acute distress.  EYES: Pupils equal, round, reactive to light and accommodation. No scleral icterus. Extraocular muscles intact.  HEENT: Head atraumatic, normocephalic. Oropharynx and nasopharynx clear.  NECK:  Supple, no jugular venous distention. No thyroid  enlargement, no tenderness.  LUNGS: Normal breath sounds bilaterally, no wheezing, rales,rhonchi or crepitation. No use of accessory muscles of respiration.  CARDIOVASCULAR: Regular rate and rhythm, S1, S2 normal. No murmurs, rubs, or gallops.  ABDOMEN: Soft, nondistended, nontender. Bowel sounds present. No organomegaly or mass.  EXTREMITIES: No pedal edema, cyanosis, or clubbing.  NEUROLOGIC: Cranial nerves II through XII are intact. Muscle strength 5/5 in all extremities. Sensation intact. Gait not checked.  PSYCHIATRIC: The patient is alert and oriented x 3.  Normal affect and good eye contact. SKIN: No obvious rash, lesion, or ulcer.    Vitals:   11/28/24 0830 11/28/24  1130 11/28/24 1341 11/28/24 1455  BP: (!) 169/100 (!) 150/83  123/84  Pulse:  69  74  Resp: 15 16  17   Temp:   97.8 F (36.6 C) 98.4 F (36.9 C)  TempSrc:   Oral Oral  SpO2:  97%    Weight:      Height:        Data Reviewed: MRI of the brain did not show any acute intracranial pathology    Latest Ref Rng & Units 11/28/2024    8:23 AM 11/27/2024    4:04 PM 11/04/2024    2:11 PM  CBC  WBC 4.0 - 10.5 K/uL 7.0  5.9  5.7   Hemoglobin 12.0 - 15.0 g/dL 87.7  87.4  87.1   Hematocrit 36.0 - 46.0 % 34.8  36.6  38.3   Platelets 150 - 400 K/uL 239  268  234        Latest Ref Rng & Units 11/28/2024    1:39 PM 11/28/2024    8:59 AM 11/28/2024    3:53 AM  BMP  Glucose 70 - 99 mg/dL 816  815  862   BUN 8 - 23 mg/dL 5  6  7    Creatinine 0.44 - 1.00 mg/dL 9.15  9.22  9.22   Sodium 135 - 145 mmol/L 136  137  138   Potassium 3.5 - 5.1 mmol/L 4.0  3.8  3.4   Chloride 98 - 111 mmol/L 104  105  104   CO2 22 - 32 mmol/L 24  24  25    Calcium  8.9 - 10.3 mg/dL 8.7  8.6  8.6      Family Communication: Discussed with sister at bedside  Disposition: Status is: Inpatient   Time spent: 51 minutes  Author: Drue ONEIDA Potter, MD 11/28/2024 5:01 PM  For on call review [email or web address].

## 2024-11-28 NOTE — TOC CM/SW Note (Signed)
 Transition of Care [facility]) CM/SW Note    Transition of Care [facility]) - Inpatient Brief Assessment   Patient Details  Name: Jane Morrison MRN: [id number] Date of Birth: [birth year]  Transition of Care [facility]) CM/SW Contact:    Alfonso Rummer, LCSW Phone Number: 11/28/2024, 3:17 PM   Clinical Narrative:  Completed toc chart review. No toc needs identified please contact should needs arise.   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

## 2024-11-28 NOTE — ED Notes (Signed)
 Pt given lunch

## 2024-11-28 NOTE — ED Notes (Signed)
 Dorinda, MD notified of stable CBG. No new orders at this time

## 2024-11-28 NOTE — Inpatient Diabetes Management (Addendum)
 Inpatient Diabetes Program Recommendations  AACE/ADA: New Consensus Statement on Inpatient Glycemic Control  Target Ranges:  Prepandial:   less than 140 mg/dL      Peak postprandial:   less than 180 mg/dL (1-2 hours)      Critically ill patients:  140 - 180 mg/dL    Latest Reference Range & Units 11/28/24 01:54 11/28/24 03:01 11/28/24 04:04 11/28/24 05:05 11/28/24 06:03 11/28/24 07:15 11/28/24 08:19 11/28/24 09:28  Glucose-Capillary 70 - 99 mg/dL 857 (H) 883 (H) 810 (H) 130 (H) 153 (H) 184 (H) 180 (H) 154 (H)    Latest Reference Range & Units 11/27/24 16:04 11/27/24 21:05 11/28/24 01:59 11/28/24 03:53 11/28/24 08:59  CO2 22 - 32 mmol/L 21 (L) 21 (L) 23 25 24   Glucose 70 - 99 mg/dL 205 (HH) 581 (H) 853 (H) 137 (H) 184 (H)  Anion gap 5 - 15  16 (H) 13 10 10 9     Latest Reference Range & Units 11/27/24 21:05  Hemoglobin A1C 4.8 - 5.6 % >18.5 (H)   Review of Glycemic Control  Diabetes history: DM2 Outpatient Diabetes medications: Metformin  XR 500 mg BID (not taking due to GI upset), Tresiba  10 units daily (ran out 2 weeks ago) Current orders for Inpatient glycemic control: Semglee  10 units BID, Novolog  0-9 units TID with meals, Novolog  0-5 units QHS  Inpatient Diabetes Program Recommendations:    Insulin : Patient was initially ordered IV insulin  and has been transitioned to SQ insulin ; patient received Semglee  10 units at 9:44 am today.  Outpatient DM: Patient does not have any insurance coverage currently. Patient should be able to get insulins from [facility] pharmacy through [facility] program at discharge and they have Basaglar  and Humalog insulin  pens available. Please provide Rx for Basaglar  pens (#[id number]), Humalog pens (#[id number]), and insulin  pen needles (#[id number]).  Addendum 11/28/24@10 :25-Spoke with patient at bedside regarding DM control. Patient states that she does not have any insurance so she is not able to get DM medications filled. Patient reports she was taking Tresiba  10 units daily  up until she ran out 2 weeks ago. Patient states she is not taking Metformin  due to GI upset (even with XR version) and she does not wish to continue taking it outpatient. Patient states she has a fairly new glucometer at home but not sure how to use it. Patient has not seen her PCP in a while due to no insurance. Informed patient that TOC has been consulted and would be asked to provide list of local clinics for uninsured so she can get established with one of the clinics for consistent follow up care and in order to get medications refilled. Informed patient that at discharge, she should be able to get discharge medications from [facility] through [facility] program and they have Basaglar  and Humalog insulin  pens available. Discussed Basalgar and Humalog insulin , how to use a correction scale, and stressed importance of taking insulin  consistently.   Discussed A1C results (>18.5% on 11/27/24) and explained that current A1C indicates an average glucose of over 484 mg/dl over the past 2-3 months. Verbally explained how to use the glucometer to start doing finger stick glucose checks at home.  Discussed glucose and A1C goals. Discussed importance of checking CBGs and maintaining good CBG control to prevent long-term and short-term complications. Explained how hyperglycemia leads to damage within blood vessels which lead to the common complications seen with uncontrolled diabetes. Stressed to the patient the importance of improving glycemic control to prevent further complications from  uncontrolled diabetes. Asked patient to be sure to get established with local clinic for uninsured and consistently follow up so she can continue to get needed refills and assistance with medication if needed.   Patient verbalized understanding of information discussed and reports no further questions at this time related to diabetes.  Thanks, Earnie Gainer, RN, MSN, CDCES Diabetes Coordinator Inpatient Diabetes Program [phone]  (Team Pager from 8am to 5pm)

## 2024-11-28 NOTE — Progress Notes (Signed)
° °  Brief Progress Note   _____________________________________________________________________________________________________________  Patient Name: Jane Morrison Patient DOB: Jan 15, 1961 Date: @TODAY @      Data: Reviewed vital signs, labs, and clinical notes.    Action: No action required at this time.    Response:  On Insulin  gtt  _____________________________________________________________________________________________________________  [facility] RN Expeditor Danniel Tones S Malikye Reppond Please contact us  directly via secure chat (search for [facility]) or by calling us  at [phone] [facility]).

## 2024-11-29 ENCOUNTER — Other Ambulatory Visit: Payer: Self-pay

## 2024-11-29 LAB — GLUCOSE, CAPILLARY: Glucose-Capillary: 329 mg/dL — ABNORMAL HIGH (ref 70–99)

## 2024-11-29 MED ORDER — INSULIN ASPART 100 UNIT/ML IJ SOLN: 5.0000 [IU] | Freq: Three times a day (TID) | INTRAMUSCULAR | Status: DC

## 2024-11-29 MED ORDER — BASAGLAR KWIKPEN 100 UNIT/ML ~~LOC~~ SOPN
15.0000 [IU] | PEN_INJECTOR | Freq: Two times a day (BID) | SUBCUTANEOUS | 1 refills | Status: AC
  Filled 2024-11-29: qty 15, 50d supply, fill #0

## 2024-11-29 MED ORDER — INSULIN GLARGINE 100 UNIT/ML ~~LOC~~ SOLN
15.0000 [IU] | Freq: Two times a day (BID) | SUBCUTANEOUS | Status: DC
  Filled 2024-11-29 (×2): qty 0.15

## 2024-11-29 MED ORDER — INSULIN LISPRO (1 UNIT DIAL) 100 UNIT/ML (KWIKPEN)
5.0000 [IU] | PEN_INJECTOR | Freq: Three times a day (TID) | SUBCUTANEOUS | 1 refills | Status: AC
  Filled 2024-11-29: qty 15, 100d supply, fill #0

## 2024-11-29 MED ORDER — SACUBITRIL-VALSARTAN 97-103 MG PO TABS
1.0000 | ORAL_TABLET | Freq: Two times a day (BID) | ORAL | 1 refills | Status: AC
  Filled 2024-11-29 (×2): qty 60, 30d supply, fill #0

## 2024-11-29 MED ORDER — CARVEDILOL 25 MG PO TABS
25.0000 mg | ORAL_TABLET | Freq: Two times a day (BID) | ORAL | 1 refills | Status: AC
  Filled 2024-11-29: qty 60, 30d supply, fill #0

## 2024-11-29 MED ORDER — INSULIN PEN NEEDLE 32G X 4 MM MISC
1.0000 | Freq: Three times a day (TID) | 1 refills | Status: AC
  Filled 2024-11-29: qty 100, 33d supply, fill #0

## 2024-11-29 NOTE — Discharge Summary (Signed)
 " Physician Discharge Summary   Patient: Jane Morrison MRN: [id number] DOB: 03-Feb-1961  Admit date:     11/27/2024  Discharge date: 11/29/2024  Discharge Physician: Drue ONEIDA Potter   PCP: Gareth Mliss FALCON, FNP   Recommendations at discharge:  Follow-up with PCP  Discharge Diagnoses: Principal Problem:   Uncontrolled type 2 diabetes mellitus with hyperosmolar nonketotic hyperglycemia (HCC) Active Problems:   Hypertensive urgency   Hyponatremia   Dyslipidemia   Chronic HFrEF (heart failure with reduced ejection fraction) (HCC)  Resolved Problems:   * No resolved hospital problems. [facility] Course: From HPI Jane Morrison is a 63 y.o. African-American female with medical history significant for anxiety, depression, CHF, type 2 diabetes mellitus, hypertension and dyslipidemia, who presented to the emergency room with acute onset of lightheadedness, dizziness and headache mainly in the frontal area.  She has been having intermittent dizziness over the last month.  This is associated with gait abnormality with imbalance.  She denies any focal paresthesias or muscle weakness.  No dysphagia or dysarthria.  No tinnitus or vertigo.  No recent falls or trauma.  She admitted to running out of her insulin  and stated that she has been been unable to afford the last month.  No nausea or vomiting or diarrhea.  No dysuria, oliguria or hematuria or flank pain.  She admits to polyuria and polydipsia.  No bleeding diathesis.   ED Course: When the patient came to the ER, BP was 139/102 with otherwise normal vital signs.  Labs revealed hyponatremia of 124 and hypochloremia of 88 with a CO2 21, blood glucose of 794 and anion gap of 16 with creatinine 1.25.  High-sensitivity troponin was less than 15 CBC was within normal.  Beta-hydroxybutyrate was normal at 0.15. EKG as reviewed by me : 12-lead EKG revealed normal sinus rhythm at a rate of 70 with possible left atrial enlargement, left axis deviation and  minimal voltage criteria for LVH. Imaging: MRI of the brain without contrast revealed moderately advanced periventricular and subcortical T2 hyperdensities for age likely the sequelae of chronic microvascular ischemia.   The patient was given 1 L bolus of IV normal saline.  She will be admitted to a stepdown unit bed for further evaluation and management.     Other hospital course as noted below:  Assessment and Plan: Uncontrolled type 2 diabetes mellitus with hyperosmolar nonketotic hyperglycemia (HCC) Patient's glucose level improved Continue basal insulin  therapy Patient does not have insurance and so TOC consulted   Hypertensive urgency Continue home medication   Hyponatremia -Improved continue to monitor   Dyslipidemia - Will continue statin therapy.   Chronic HFrEF (heart failure with reduced ejection fraction) (HCC) Continue Coreg  and Entresto  as well as Aldactone .     Consultants: None Procedures performed: None Disposition: Home Diet recommendation:  Cardiac diet DISCHARGE MEDICATION: Allergies as of 11/29/2024   No Known Allergies      Medication List     STOP taking these medications    mebendazole  100 MG chewable tablet Commonly known as: VERMOX    Tresiba  FlexTouch 100 UNIT/ML FlexTouch Pen Generic drug: insulin  degludec       TAKE these medications    aspirin  81 MG chewable tablet Chew 1 tablet (81 mg total) by mouth daily.   Basaglar  KwikPen 100 UNIT/ML Inject 15 Units into the skin 2 (two) times daily. Notes to patient: Last given yesterday    BD Pen Needle Micro U/F 32G X 6 MM Misc Generic drug: Insulin  Pen  Needle Use to administer insulin  What changed: Another medication with the same name was added. Make sure you understand how and when to take each.   Insupen Pen Needles 32G X 4 MM Misc Generic drug: Insulin  Pen Needle Use 3 (three) times daily to inject insulin . What changed: You were already taking a medication with the same  name, and this prescription was added. Make sure you understand how and when to take each.   blood glucose meter kit and supplies Kit Dispense based on patient and insurance preference. Use up to four times daily as directed. (FOR ICD-9 250.00, 250.01).   carvedilol  25 MG tablet Commonly known as: COREG  Take 1 tablet (25 mg total) by mouth 2 (two) times daily. Notes to patient: Last given today at 8:52   Gvoke HypoPen  1-Pack 0.5 MG/0.1ML Soaj Generic drug: Glucagon  Inject 0.5 mg into the skin once as needed for up to 1 dose (for hypoglycemia).   HumaLOG KwikPen 100 UNIT/ML KwikPen Generic drug: insulin  lispro Inject 5 Units into the skin 3 (three) times daily.   meclizine  25 MG tablet Commonly known as: ANTIVERT  Take 1 tablet (25 mg total) by mouth 3 (three) times daily as needed.   metFORMIN  500 MG tablet Commonly known as: GLUCOPHAGE  Take 1 tablet (500 mg total) by mouth 2 (two) times daily with a meal.   OneTouch Delica Plus Lancet30G Misc Use to check blood sugar up to four times daily as directed   OneTouch Verio Flex System w/Device Kit Use to check blood sugar up to four times daily as directed   OneTouch Verio test strip Generic drug: glucose blood Use to check blood sugar up to four times daily as directed   rosuvastatin  20 MG tablet Commonly known as: CRESTOR  Take 1 tablet (20 mg total) by mouth daily. Notes to patient: Last given today at 8:52 AM   sacubitril -valsartan  97-103 MG Commonly known as: Entresto  Take 1 tablet by mouth 2 (two) times daily.   spironolactone  25 MG tablet Commonly known as: ALDACTONE  TAKE 1 TABLET (25 MG TOTAL) BY MOUTH DAILY. Notes to patient: Last given today at 8:52 AM   Trulicity  1.5 MG/0.5ML Soaj Generic drug: Dulaglutide  Inject 1.5 mg into the skin once a week.        Discharge Exam: Filed Weights   11/27/24 1601  Weight: 68 kg   GENERAL:  63 y.o.-year-old African-American female patient lying in the bed with no  acute distress.  EYES: Pupils equal, round, reactive to light and accommodation. No scleral icterus. Extraocular muscles intact.  HEENT: Head atraumatic, normocephalic. Oropharynx and nasopharynx clear.  NECK:  Supple, no jugular venous distention. No thyroid  enlargement, no tenderness.  LUNGS: Normal breath sounds bilaterally, no wheezing, rales,rhonchi or crepitation. No use of accessory muscles of respiration.  CARDIOVASCULAR: Regular rate and rhythm, S1, S2 normal. No murmurs, rubs, or gallops.  ABDOMEN: Soft, nondistended, nontender. Bowel sounds present. No organomegaly or mass.  EXTREMITIES: No pedal edema, cyanosis, or clubbing.  NEUROLOGIC: Cranial nerves II through XII are intact. Muscle strength 5/5 in all extremities. Sensation intact. Gait not checked.  PSYCHIATRIC: The patient is alert and oriented x 3.  Normal affect and good eye contact. SKIN: No obvious rash, lesion, or ulcer.  Condition at discharge: good  The results of significant diagnostics from this hospitalization (including imaging, microbiology, ancillary and laboratory) are listed below for reference.   Imaging Studies: MR BRAIN WO CONTRAST Result Date: 11/27/2024 EXAM: MRI BRAIN WITHOUT CONTRAST 11/27/2024 07:11:43 PM TECHNIQUE:  Multiplanar multisequence MRI of the head/brain was performed without the administration of intravenous contrast. COMPARISON: None available. CLINICAL HISTORY: Neuro deficit, acute, stroke suspected. Dizziness and unsteady gait. FINDINGS: BRAIN AND VENTRICLES: No acute infarct. No intracranial hemorrhage. No mass. No midline shift. No hydrocephalus. Periventricular and subcortical T2 hyperintensities are moderately advanced for age. The sella is unremarkable. Normal flow voids. ORBITS: No acute abnormality. SINUSES AND MASTOIDS: No acute abnormality. BONES AND SOFT TISSUES: Normal marrow signal. No acute soft tissue abnormality. IMPRESSION: 1. No acute intracranial abnormality. 2. Moderately  advanced periventricular and subcortical T2 hyperintensities for age. This most likely reflects the sequelae of chronic microvascular ischemia. Electronically signed by: Lonni Necessary MD 11/27/2024 07:22 PM EST RP Workstation: [id number]   CT Head Wo Contrast Result Date: 11/04/2024 EXAM: CT HEAD WITHOUT CONTRAST 11/04/2024 02:58:31 PM TECHNIQUE: CT of the head was performed without the administration of intravenous contrast. Automated exposure control, iterative reconstruction, and/or weight based adjustment of the mA/kV was utilized to reduce the radiation dose to as low as reasonably achievable. COMPARISON: Head CT 04/27/2023. CLINICAL HISTORY: Headache, increasing frequency or severity. FINDINGS: BRAIN AND VENTRICLES: There is no evidence of an acute infarct, intracranial hemorrhage, mass, midline shift, hydrocephalus, or extra-axial fluid collection. Cerebral volume is normal for age. Cerebral white matter hypodensities are unchanged and nonspecific but compatible with mild to moderate chronic small vessel ischemic disease. A partially empty sella is noted. Calcified atherosclerosis at the skull base. ORBITS: No acute abnormality. SINUSES: No acute abnormality. SOFT TISSUES AND SKULL: No acute soft tissue abnormality. No skull fracture. IMPRESSION: 1. No acute intracranial abnormality. 2. Mild to moderate chronic small vessel ischemic disease. Electronically signed by: Dasie Hamburg MD 11/04/2024 03:12 PM EST RP Workstation: [id number]    Microbiology: Results for orders placed or performed during the hospital encounter of 11/27/24  Urine Culture     Status: Abnormal (Preliminary result)   Collection Time: 11/27/24  9:42 PM   Specimen: Urine, Random  Result Value Ref Range Status   Specimen Description   Final    URINE, RANDOM Performed at [facility], [address] [place], [place] [address]    Special Requests   Final    NONE Reflexed from [id number] Performed at [facility], [address] [place], [place] [address]    Culture (A)  Final    >=100,000 COLONIES/mL ESCHERICHIA COLI SUSCEPTIBILITIES TO FOLLOW Performed at [facility], [address], [place], [place] [address]    Report Status PENDING  Incomplete    Labs: CBC: Recent Labs  Lab 11/27/24 1604 11/28/24 0823  WBC 5.9 7.0  NEUTROABS 3.0  --   HGB 12.5 12.2  HCT 36.6 34.8*  MCV 86.9 85.3  PLT 268 239   Basic Metabolic Panel: Recent Labs  Lab 11/27/24 2105 11/28/24 0159 11/28/24 0353 11/28/24 0859 11/28/24 1339  NA 133* 139 138 137 136  K 3.7 3.1* 3.4* 3.8 4.0  CL 99 106 104 105 104  CO2 21* 23 25 24 24   GLUCOSE 418* 146* 137* 184* 183*  BUN 9 8 7* 6* <5*  CREATININE 0.79 0.79 0.77 0.77 0.84  CALCIUM  9.2 8.9 8.6* 8.6* 8.7*   Liver Function Tests: No results for input(s): AST, ALT, ALKPHOS, BILITOT, PROT, ALBUMIN in the last 168 hours. CBG: Recent Labs  Lab 11/28/24 1113 11/28/24 1216 11/28/24 1726 11/28/24 2016 11/29/24 0840  GLUCAP 203* 183* 332* 366* 329*    Discharge time spent:  38 minutes.  Signed:  Drue ONEIDA Potter, MD [facility] 11/29/2024 "

## 2024-11-29 NOTE — Plan of Care (Signed)

## 2024-11-29 NOTE — Inpatient Diabetes Management (Signed)
 Inpatient Diabetes Program Recommendations  AACE/ADA: New Consensus Statement on Inpatient Glycemic Control  Target Ranges:  Prepandial:   less than 140 mg/dL      Peak postprandial:   less than 180 mg/dL (1-2 hours)      Critically ill patients:  140 - 180 mg/dL    Latest Reference Range & Units 11/28/24 07:15 11/28/24 08:19 11/28/24 09:28 11/28/24 11:13 11/28/24 12:16 11/28/24 17:26 11/28/24 20:16 11/29/24 08:40  Glucose-Capillary 70 - 99 mg/dL 815 (H) 819 (H) 845 (H) 203 (H) 183 (H) 332 (H) 366 (H) 329 (H)  (H): Data is abnormally high  Review of Glycemic Control  Diabetes history: DM2 Outpatient Diabetes medications: Metformin  XR 500 mg BID (not taking due to GI upset), Tresiba  10 units daily (ran out 2 weeks ago) Current orders for Inpatient glycemic control: Lantus  10 units BID, Novolog  0-9 units TID with meals, Novolog  0-5 units QHS   Inpatient Diabetes Program Recommendations:     Insulin : Please consider increasing Lantus  to 15 units BID (to start now) and adding Novolog  5 units TID with meals for meal coverage if patient eats at least 50% of meals.   Outpatient DM: Patient does not have any insurance coverage currently. Patient should be able to get insulins from [facility] pharmacy through [facility] program at discharge and they have Basaglar  and Humalog insulin  pens available. Please provide Rx for Basaglar  pens (#[id number]), Humalog pens (#[phone]), and insulin  pen needles (#[id number]).   Thanks, Earnie Gainer, RN, MSN, CDCES Diabetes Coordinator Inpatient Diabetes Program [phone] (Team Pager from 8am to 5pm)

## 2024-11-30 LAB — URINE CULTURE: Culture: >=100000 — AB

## 2024-12-23 ENCOUNTER — Telehealth: Payer: Self-pay | Admitting: Family

## 2024-12-23 NOTE — Telephone Encounter (Signed)
 Called to confirm/remind patient of their [facility] on [facility]24[facility].   Appointment:   [x] Confirmed  [] Left mess   [] No answer/No voice mail  [] VM Full/unable to leave message  [] Phone not in service  Patient reminded to bring all medications and/or complete list.  Confirmed patient has transportation. Gave directions, instructed to utilize valet parking.

## 2024-12-23 NOTE — Progress Notes (Unsigned)
 "  Advanced Heart Failure Clinic Note    PCP: Gareth Mliss FALCON, FNP  Cardiologist: Ammon Blunt, MD (last seen during 04/24 admission)  Chief Complaint: shortness of breath   HPI:  Jane Morrison is a 64 y/o female with a history of DM, hyperlipidemia, HTN, anxiety, depression, CAD, tobacco use & chronic heart failure (newly diagnosed)  Admitted 03/30/23 due to acute onset of worsening dyspnea, associated orthopnea, chest pressure and cough productive of clear sputum as well as wheezing. Diuresed with IV lasix . NTG drip started and cardiology consulted. Initially needed bipap due to hypoxia but able to be weaned off to room air. Echo 03/30/23: EF 25-30% with mild/ moderate LVH consistent with Grade I DD and mild MR.   RHC/LHC 04/03/23:   Prox RCA to Mid RCA lesion is 70% stenosed.   Dist RCA lesion is 50% stenosed.   Ost LAD to Prox LAD lesion is 30% stenosed.   There is moderate left ventricular systolic dysfunction.   The left ventricular ejection fraction is 35-45% by visual estimate.  1.  Normal right sided heart pressures (PA 25/7 (13), PCWP 6/4) 2.  Mild to moderate coronary artery disease with 70% stenosis mid RCA 3.  Moderately reduced left ventricular function with estimated LV ejection fraction 35-45%  Was in the ED 04/27/23 after a mechanical fall after she tripped while at work at [employer]. Hit her head on the ground but no LOC. Negative imaging.   Seen in the HF clinic 04/25 and carvedilol  was increased to 25mg  BID and jardiance  was resumed.   Seen in [facility] 05/25 and jardiance / entresto  were both resumed. Crestor  10mg  was started after getting lab results back.   Between Aug-Nov 2025, Was in the ED 5 times with varying complaints.   Admitted 11/27/24 with acute onset of lightheadedness, dizziness and headache mainly in the frontal area. She has been having intermittent dizziness over the last month. When the patient came to the ER, BP was 139/102 with otherwise normal  vital signs. Labs revealed hyponatremia of 124 and hypochloremia of 88 with a CO2 21, blood glucose of 794 and anion gap of 16 with creatinine 1.25. EKG normal. MRI of the brain without contrast revealed moderately advanced periventricular and subcortical T2 hyperdensities for age likely the sequelae of chronic microvascular ischemia. IVF given and glucose improved.   She presents today for a HF follow-up visit with a chief complaint of minimal shortness of breath. Has associated fatigue, head congestion, occasional palpitations, occasional dizziness. Decreased appetite. Hasn't smoked since 04/24. Denies chest pain, pedal edema, abdominal distention. Glucose was >700 during 12/25 admission. She checks it sometimes but can't say with certainty what her glucose is.   Saw PCP 08/25 and was treated for pinworms, however, patient said that she was unable to afford the medication.   Working at [employer] now. No medical insurance currently.   ROS: All systems negative except as listed in HPI, PMH and Problem List.  SH:  Social History   Socioeconomic History   Marital status: Married    Spouse name: Not on file   Number of children: 3   Years of education: Not on file   Highest education level: Associate degree: academic program  Occupational History   Not on file  Tobacco Use   Smoking status: Former    Current packs/day: 0.00    Average packs/day: 0.5 packs/day for 40.0 years (20.0 ttl pk-yrs)    Types: Cigarettes    Start date: 03/29/1983  Quit date: 03/29/2023    Years since quitting: 1.7    Passive exposure: Past   Smokeless tobacco: Never  Vaping Use   Vaping status: Never Used  Substance and Sexual Activity   Alcohol use: No   Drug use: No   Sexual activity: Yes  Other Topics Concern   Not on file  Social History Narrative   3 biological kids 4 stepchildren   Social Drivers of Health   Tobacco Use: Medium Risk (11/27/2024)   Patient History    Smoking Tobacco Use:  Former    Smokeless Tobacco Use: Never    Passive Exposure: Past  Physicist, Medical Strain: Low Risk (01/23/2024)   Overall Financial Resource Strain (CARDIA)    Difficulty of Paying Living Expenses: Not very hard  Food Insecurity: No Food Insecurity (11/28/2024)   Epic    Worried About Radiation Protection Practitioner of Food in the Last Year: Never true    Ran Out of Food in the Last Year: Never true  Transportation Needs: No Transportation Needs (11/28/2024)   Epic    Lack of Transportation (Medical): No    Lack of Transportation (Non-Medical): No  Physical Activity: Insufficiently Active (01/23/2024)   Exercise Vital Sign    Days of Exercise per Week: 2 days    Minutes of Exercise per Session: 10 min  Stress: No Stress Concern Present (01/23/2024)   [employer] of Occupational Health - Occupational Stress Questionnaire    Feeling of Stress : Only a little  Social Connections: Socially Integrated (01/23/2024)   Social Connection and Isolation Panel    Frequency of Communication with Friends and Family: More than three times a week    Frequency of Social Gatherings with Friends and Family: More than three times a week    Attends Religious Services: 1 to 4 times per year    Active Member of [employer] or Organizations: Yes    Attends Banker Meetings: 1 to 4 times per year    Marital Status: Married  Catering Manager Violence: Patient Declined (11/29/2024)   Epic    Fear of Current or Ex-Partner: Patient declined    Emotionally Abused: Patient declined    Physically Abused: Patient declined    Sexually Abused: Patient declined  Depression (PHQ2-9): Low Risk (04/24/2024)   Depression (PHQ2-9)    PHQ-2 Score: 0  Alcohol Screen: Low Risk (10/31/2023)   Alcohol Screen    Last Alcohol Screening Score (AUDIT): 0  Housing: Low Risk (11/28/2024)   Epic    Unable to Pay for Housing in the Last Year: No    Number of Times Moved in the Last Year: 0    Homeless in the Last Year: No   Utilities: Not At Risk (11/29/2024)   Epic    Threatened with loss of utilities: No  Health Literacy: Adequate Health Literacy (07/26/2023)   B1300 Health Literacy    Frequency of need for help with medical instructions: Never    FH:  Family History  Problem Relation Age of Onset   Hypertension Mother    Diabetes Mother    Hypertension Father    Diabetes Father    Breast cancer Sister 72    Past Medical History:  Diagnosis Date   Anxiety    CHF (congestive heart failure) (HCC)    was taken off lasix    Depression    Diabetes mellitus without complication (HCC)    Hyperlipidemia    Hypertension     Current Outpatient Medications  Medication Sig  Dispense Refill   aspirin  81 MG chewable tablet Chew 1 tablet (81 mg total) by mouth daily. 90 tablet 0   blood glucose meter kit and supplies KIT Dispense based on patient and insurance preference. Use up to four times daily as directed. (FOR ICD-9 250.00, 250.01). 1 each 0   Blood Glucose Monitoring Suppl (ONETOUCH VERIO FLEX SYSTEM) w/Device KIT Use to check blood sugar up to four times daily as directed 1 kit 0   carvedilol  (COREG ) 25 MG tablet Take 1 tablet (25 mg total) by mouth 2 (two) times daily. 60 tablet 1   Dulaglutide  (TRULICITY ) 1.5 MG/0.5ML SOAJ Inject 1.5 mg into the skin once a week. (Patient not taking: Reported on 07/31/2024) 6 mL 0   Glucagon  (GVOKE HYPOPEN  1-PACK) 0.5 MG/0.1ML SOAJ Inject 0.5 mg into the skin once as needed for up to 1 dose (for hypoglycemia). 0.1 mL 3   glucose blood (ONETOUCH VERIO) test strip Use to check blood sugar up to four times daily as directed 400 each 3   Insulin  Glargine (BASAGLAR  KWIKPEN) 100 UNIT/ML Inject 15 Units into the skin 2 (two) times daily. 15 mL 1   insulin  lispro (HUMALOG  KWIKPEN) 100 UNIT/ML KwikPen Inject 5 Units into the skin 3 (three) times daily. 15 mL 1   Insulin  Pen Needle (BD PEN NEEDLE MICRO U/F) 32G X 6 MM MISC Use to administer insulin  100 each 5   Insulin  Pen  Needle 32G X 4 MM MISC Use 3 (three) times daily to inject insulin . 100 each 1   Lancets (ONETOUCH DELICA PLUS LANCET30G) MISC Use to check blood sugar up to four times daily as directed 400 each 3   meclizine  (ANTIVERT ) 25 MG tablet Take 1 tablet (25 mg total) by mouth 3 (three) times daily as needed. (Patient not taking: Reported on 11/27/2024) 15 tablet 0   metFORMIN  (GLUCOPHAGE ) 500 MG tablet Take 1 tablet (500 mg total) by mouth 2 (two) times daily with a meal. 180 tablet 0   rosuvastatin  (CRESTOR ) 20 MG tablet Take 1 tablet (20 mg total) by mouth daily. 90 tablet 1   sacubitril -valsartan  (ENTRESTO ) 97-103 MG Take 1 tablet by mouth 2 (two) times daily. 60 tablet 1   spironolactone  (ALDACTONE ) 25 MG tablet TAKE 1 TABLET (25 MG TOTAL) BY MOUTH DAILY. 30 tablet 11   No current facility-administered medications for this visit.   Vitals:   12/24/24 0943  BP: 110/78  Pulse: 72  SpO2: 100%  Weight: 147 lb (66.7 kg)   Wt Readings from Last 3 Encounters:  12/24/24 147 lb (66.7 kg)  11/27/24 150 lb (68 kg)  11/04/24 160 lb 15 oz (73 kg)   Lab Results  Component Value Date   CREATININE 0.84 11/28/2024   CREATININE 0.77 11/28/2024   CREATININE 0.77 11/28/2024    PHYSICAL EXAM:  General: Tired, thin appearing female. NAD  Cor: No JVD. Regular rhythm, rate.  Lungs: clear Abdomen: soft, nontender, nondistended. Extremities: no edema Neuro:. Affect pleasant   ECG: not done   ASSESSMENT & PLAN:  1: NICM with reduced ejection fraction- - etiology likely HTN/ uncontrolled DM - NYHA Morrison II - euvolemic - weight down 16 pounds from last visit here 5 months ago. ? Uncontrolled DM and / or untreated parasites - Echo 03/30/23: EF 25-30% with mild/ moderate LVH consistent with Grade I DD and mild MR.  - wanted to get echo updated but she's currently uninsured so will defer for now.  - continue carvedilol  25mg  BID -  continue entresto  97/103mg  BID - continue spironolactone  25mg   daily - unable to afford SGLT2 so it was stopped - BNP 08/10/24 was 25.8  2: HTN- - BP 110/78 - saw PCP [place]) 08/25. I reached out to PCP to get an appointment scheduled.  - BMP 11/28/24 reviewed: sodium 136, potassium 4.0, creatinine 0.84 & GFR >60 - BMET today  3: CAD- - saw cardiology (Paraschos) during admission 04/24. Will reach out to provider's office, again, to get appt scheduled - RHC/LHC 04/03/23:   Prox RCA to Mid RCA lesion is 70% stenosed.   Dist RCA lesion is 50% stenosed.   Ost LAD to Prox LAD lesion is 30% stenosed.   There is moderate left ventricular systolic dysfunction.   The left ventricular ejection fraction is 35-45% by visual estimate.  1.  Normal right sided heart pressures (PA 25/7 (13), PCWP 6/4) 2.  Mild to moderate coronary artery disease with 70% stenosis mid RCA 3.  Moderately reduced left ventricular function with estimated LV ejection fraction 35-45%  4: DM (managed by PCP)- - A1c 11/1724 was >18.5% - continue metformin , weekly trulicity  and insulin  - awaiting endocrinology appt (referral made by PCP)  5: Hyperlipidemia- - LDL 07/31/24 was 101 - continue rosuvastatin  20mg  daily. (Increased 08/25) - lipid panel today  6: Fatigue- - will check iron, ferritin today. If low, will order IV iron   While patient was still in clinic, PCP office reached out and scheduled an appointment for later this week. Emphasized the importance of keeping this appointment to discuss weight loss, uncontrolled DM and untreated pinworms. Information on [facility] provided today since she's currently uninsured.   Return here in 3 months, sooner if needed.   I spent 40 minutes reviewing records, interviewing/ examing patient and managing plan/ orders.   Jane DELENA Class, FNP 12/23/2024  "

## 2024-12-24 ENCOUNTER — Ambulatory Visit: Payer: Self-pay | Admitting: Family

## 2024-12-24 ENCOUNTER — Other Ambulatory Visit: Payer: Self-pay | Admitting: Family

## 2024-12-24 ENCOUNTER — Ambulatory Visit: Payer: MEDICAID | Admitting: Family

## 2024-12-24 ENCOUNTER — Encounter: Payer: Self-pay | Admitting: Family

## 2024-12-24 ENCOUNTER — Other Ambulatory Visit
Admission: RE | Admit: 2024-12-24 | Discharge: 2024-12-24 | Disposition: A | Discharge status: Home / Self Care | Payer: MEDICAID | Source: Ambulatory Visit | Attending: Family | Admitting: Family

## 2024-12-24 VITALS — BP 110/78 | HR 72 | Wt 147.0 lb

## 2024-12-24 DIAGNOSIS — R5383 Other fatigue: Secondary | ICD-10-CM

## 2024-12-24 DIAGNOSIS — E1165 Type 2 diabetes mellitus with hyperglycemia: Secondary | ICD-10-CM

## 2024-12-24 DIAGNOSIS — E782 Mixed hyperlipidemia: Secondary | ICD-10-CM

## 2024-12-24 DIAGNOSIS — I5022 Chronic systolic (congestive) heart failure: Secondary | ICD-10-CM | POA: Insufficient documentation

## 2024-12-24 DIAGNOSIS — I2510 Atherosclerotic heart disease of native coronary artery without angina pectoris: Secondary | ICD-10-CM

## 2024-12-24 DIAGNOSIS — I10 Essential (primary) hypertension: Secondary | ICD-10-CM

## 2024-12-24 DIAGNOSIS — R5381 Other malaise: Secondary | ICD-10-CM | POA: Insufficient documentation

## 2024-12-24 LAB — LIPID PANEL
Cholesterol: 152 mg/dL (ref 0–200)
HDL: 33 mg/dL — ABNORMAL LOW
LDL Cholesterol: 93 mg/dL (ref 0–99)
Total CHOL/HDL Ratio: 4.6 ratio
Triglycerides: 130 mg/dL
VLDL: 26 mg/dL (ref 0–40)

## 2024-12-24 LAB — BASIC METABOLIC PANEL WITH GFR
Anion gap: 10 (ref 5–15)
BUN: 9 mg/dL (ref 8–23)
CO2: 24 mmol/L (ref 22–32)
Calcium: 9.2 mg/dL (ref 8.9–10.3)
Chloride: 97 mmol/L — ABNORMAL LOW (ref 98–111)
Creatinine, Ser: 1.12 mg/dL — ABNORMAL HIGH (ref 0.44–1.00)
GFR, Estimated: 56 mL/min — ABNORMAL LOW
Glucose, Bld: 403 mg/dL — ABNORMAL HIGH (ref 70–99)
Potassium: 4.1 mmol/L (ref 3.5–5.1)
Sodium: 131 mmol/L — ABNORMAL LOW (ref 135–145)

## 2024-12-24 LAB — IRON AND TIBC
Iron: 31 ug/dL (ref 28–170)
Saturation Ratios: 10 % — ABNORMAL LOW (ref 10.4–31.8)
TIBC: 301 ug/dL (ref 250–450)
UIBC: 270 ug/dL

## 2024-12-24 LAB — FERRITIN: Ferritin: 252 ng/mL (ref 11–307)

## 2024-12-24 MED ORDER — ROSUVASTATIN CALCIUM 20 MG PO TABS: 20.0000 mg | ORAL_TABLET | Freq: Every day | ORAL | 3 refills | Status: DC

## 2024-12-24 NOTE — Patient Instructions (Addendum)
 I've reached out to your primary care provider about getting an appointment.   Medication Changes:  No medication changes today!  Lab Work:  Go over to the [facility]. Go pass the gift shop and have your blood work completed.  We will only call you if the results are abnormal or if the provider would like to make medication changes.  No news is good news.    Follow-Up in: Please follow up with the Advanced Heart Failure Clinic in 3 months with Ellouise Class, FNP.   Thank you for choosing [facility] Advanced [facility] Failure [facility].    At the Advanced Heart Failure Clinic, you and your health needs are our priority. We have a designated team specialized in the treatment of Heart Failure. This Care Team includes your primary Heart Failure Specialized Cardiologist (physician), Advanced Practice Providers (APPs- Physician Assistants and Nurse Practitioners), and Pharmacist who all work together to provide you with the care you need, when you need it.   You may see any of the following providers on your designated Care Team at your next follow up:  Dr. Toribio Fuel Dr. Ezra Shuck Dr. Ria Commander Dr. Morene Brownie Ellouise Class, FNP Jaun Bash, RPH-CPP  Please be sure to bring in all your medications bottles to every appointment.   Need to Contact Us :  If you have any questions or concerns before your next appointment please send us  a message through [place] or call our office at [phone].    TO LEAVE A MESSAGE FOR THE NURSE SELECT OPTION 2, PLEASE LEAVE A MESSAGE INCLUDING: YOUR NAME DATE OF BIRTH CALL BACK NUMBER REASON FOR CALL**this is important as we prioritize the call backs  YOU WILL RECEIVE A CALL BACK THE SAME DAY AS LONG AS YOU CALL BEFORE 4:00 PM

## 2024-12-26 ENCOUNTER — Ambulatory Visit: Payer: Self-pay

## 2024-12-26 ENCOUNTER — Ambulatory Visit (INDEPENDENT_AMBULATORY_CARE_PROVIDER_SITE_OTHER): Payer: Self-pay | Admitting: Nurse Practitioner

## 2024-12-26 ENCOUNTER — Other Ambulatory Visit: Payer: Self-pay

## 2024-12-26 ENCOUNTER — Encounter: Payer: Self-pay | Admitting: Nurse Practitioner

## 2024-12-26 ENCOUNTER — Other Ambulatory Visit (HOSPITAL_COMMUNITY)
Admission: RE | Admit: 2024-12-26 | Discharge: 2024-12-26 | Disposition: A | Discharge status: Home / Self Care | Payer: MEDICAID | Source: Ambulatory Visit | Attending: Nurse Practitioner | Admitting: Nurse Practitioner

## 2024-12-26 VITALS — BP 132/88 | HR 71 | Temp 97.5°F | Ht 68.0 in | Wt 152.0 lb

## 2024-12-26 DIAGNOSIS — N898 Other specified noninflammatory disorders of vagina: Secondary | ICD-10-CM

## 2024-12-26 DIAGNOSIS — Z794 Long term (current) use of insulin: Secondary | ICD-10-CM

## 2024-12-26 DIAGNOSIS — Z09 Encounter for follow-up examination after completed treatment for conditions other than malignant neoplasm: Secondary | ICD-10-CM

## 2024-12-26 DIAGNOSIS — N3000 Acute cystitis without hematuria: Secondary | ICD-10-CM

## 2024-12-26 DIAGNOSIS — I10 Essential (primary) hypertension: Secondary | ICD-10-CM

## 2024-12-26 DIAGNOSIS — E1165 Type 2 diabetes mellitus with hyperglycemia: Secondary | ICD-10-CM

## 2024-12-26 DIAGNOSIS — E782 Mixed hyperlipidemia: Secondary | ICD-10-CM

## 2024-12-26 DIAGNOSIS — R350 Frequency of micturition: Secondary | ICD-10-CM

## 2024-12-26 DIAGNOSIS — I5022 Chronic systolic (congestive) heart failure: Secondary | ICD-10-CM

## 2024-12-26 LAB — POCT URINALYSIS DIPSTICK
Bilirubin, UA: NEGATIVE
Blood, UA: NEGATIVE
Glucose, UA: POSITIVE — AB
Ketones, UA: NEGATIVE
Leukocytes, UA: NEGATIVE
Nitrite, UA: NEGATIVE
Protein, UA: NEGATIVE
Spec Grav, UA: 1.015 — AB
Urobilinogen, UA: 0.2 U/dL
pH, UA: 5

## 2024-12-26 MED ORDER — VALSARTAN 40 MG PO TABS
40.0000 mg | ORAL_TABLET | Freq: Every day | ORAL | 0 refills | Status: AC
  Filled 2024-12-26: qty 30, 30d supply, fill #0

## 2024-12-26 MED ORDER — NITROFURANTOIN MONOHYD MACRO 100 MG PO CAPS
100.0000 mg | ORAL_CAPSULE | Freq: Two times a day (BID) | ORAL | 0 refills | Status: AC
  Filled 2024-12-26: qty 10, 5d supply, fill #0

## 2024-12-26 MED ORDER — ROSUVASTATIN CALCIUM 20 MG PO TABS
20.0000 mg | ORAL_TABLET | Freq: Every day | ORAL | 3 refills | Status: AC
  Filled 2024-12-26: qty 90, 90d supply, fill #0

## 2024-12-26 MED ORDER — TRULICITY 0.75 MG/0.5ML ~~LOC~~ SOAJ
0.7500 mg | SUBCUTANEOUS | 1 refills | Status: AC
  Filled 2024-12-26: qty 2, 28d supply, fill #0

## 2024-12-26 NOTE — Progress Notes (Signed)
 "  S:     Reason for visit: ?  Jane Morrison is a 64 y.o. female with a history of diabetes (type 2), who presents today for an initial diabetes Face to Face pharmacotherapy visit.? Pertinent PMH also includes HTN, HFrEF, HLD.  They were referred to the pharmacist by their PCP for assistance in managing diabetes, hypertension, hyperlipidemia/cardiovascular risk reduction, and medication access.  Care Team: Primary Care Provider: Gareth Mliss FALCON, FNP  Patient was admitted to [facility] for treatment of HHS. Patient had been out of her Tresiba  insulin  for about 2 weeks prior to that point.   Today, patient reports she has not been taking metformin  d/t GI upset. She ran out of both rosuvastatin  and Entresto  a few months ago.   Current diabetes medications include: Basaglar  15 units BID, Humalog  5 units TID, metformin  500 mg BID (not taking) Previous diabetes medications include: Farxiga , Jardiance , Mounjaro , Tresiba  Current hypertension medications include: carvedilol  25 mg BID, spironolactone  25 mg daily, Entresto  97/103 mg BID (not taking) Current hyperlipidemia medications include: rosuvastatin  20 mg daily (not taking)  Patient reports adherence with all medications, aside from those noted above.  Have you been experiencing any side effects to the medications prescribed? Yes - diarrhea with metformin  Do you have any problems obtaining medications due to transportation or finances? yes Insurance coverage: self-pay  Current medication access support: [facility] via [facility]  Patient denies hypoglycemic events.  Reported home fasting blood sugars: 140-150 mg/dL (did not bring log & reports infrequent testing)   Patient reports nocturia (nighttime urination) 3 times a night, however, states this has improved from before her hospitalization Patient reports improvement in polydipsia Patient denies neuropathy (nerve pain). Patient denies visual changes. Patient denies self foot exams.   Patient  reported dietary habits: Eats 1 meals/day Dinner: fried chicken, potatoes, biscuit, fresh fruit, broccoli Snacks: peanut butter crackers, cheese & cracker, tuna salad & crackers Drinks: lemonade, sweet tea, coffee   Patient-reported exercise habits: none reported DM Prevention:  Statin: not currently taking ACE/ARB: not currently taking Last urinary albumin/creatinine ratio:  Lab Results  Component Value Date   MICRALBCREAT 12 04/24/2024   MICRALBCREAT 21 03/22/2023   MICRALBCREAT NOTE 06/13/2022   Last eye exam:  Lab Results  Component Value Date   HMDIABEYEEXA Retinopathy (A) 01/31/2024   Lab Results  Component Value Date   HMDIABEYEEXA Retinopathy (A) 01/31/2024   Last foot exam: 07/26/2023 Tobacco Use:  Tobacco Use: Medium Risk (12/24/2024)   Patient History    Smoking Tobacco Use: Former    Smokeless Tobacco Use: Never    Passive Exposure: Past   O:  Vitals:  Wt Readings from Last 3 Encounters:  12/24/24 147 lb (66.7 kg)  11/27/24 150 lb (68 kg)  11/04/24 160 lb 15 oz (73 kg)   BP Readings from Last 3 Encounters:  12/24/24 110/78  11/29/24 (!) 151/83  11/04/24 (!) 207/101   Pulse Readings from Last 3 Encounters:  12/24/24 72  11/29/24 71  11/04/24 63     Labs:?  Lab Results  Component Value Date   HGBA1C >18.5 (H) 11/27/2024   HGBA1C 10.2 (A) 04/24/2024   HGBA1C 10.8 (A) 01/24/2024   GLUCOSE 403 (H) 12/24/2024   MICRALBCREAT 12 04/24/2024   MICRALBCREAT 21 03/22/2023   MICRALBCREAT NOTE 06/13/2022   CREATININE 1.12 (H) 12/24/2024   CREATININE 0.84 11/28/2024   CREATININE 0.77 11/28/2024    Lab Results  Component Value Date   CHOL 152 12/24/2024   LDLCALC 93  12/24/2024   LDLCALC 101 (H) 07/31/2024   LDLCALC 124 (H) 04/17/2024   HDL 33 (L) 12/24/2024   TRIG 130 12/24/2024   TRIG 101 07/31/2024   TRIG 107 04/17/2024   ALT 15 08/10/2024   ALT 131 (H) 03/30/2023   AST 27 08/10/2024   AST 221 (H) 03/30/2023      Chemistry       Component Value Date/Time   NA 131 (L) 12/24/2024 1057   NA 138 01/17/2024 0939   NA 135 (L) 11/05/2012 0811   K 4.1 12/24/2024 1057   K 3.7 11/05/2012 0811   CL 97 (L) 12/24/2024 1057   CL 101 11/05/2012 0811   CO2 24 12/24/2024 1057   CO2 27 11/05/2012 0811   BUN 9 12/24/2024 1057   BUN 14 01/17/2024 0939   BUN 16 11/05/2012 0811   CREATININE 1.12 (H) 12/24/2024 1057   CREATININE 1.18 (H) 07/26/2023 1021      Component Value Date/Time   CALCIUM  9.2 12/24/2024 1057   CALCIUM  9.3 11/05/2012 0811   ALKPHOS 94 08/10/2024 1554   ALKPHOS 82 11/05/2012 0811   AST 27 08/10/2024 1554   AST 28 11/05/2012 0811   ALT 15 08/10/2024 1554   ALT 28 11/05/2012 0811   BILITOT 0.7 08/10/2024 1554   BILITOT 0.4 11/05/2012 0811       The 10-year ASCVD risk score (Arnett DK, et al., 2019) is: 11.1%  Lab Results  Component Value Date   MICRALBCREAT 12 04/24/2024   MICRALBCREAT 21 03/22/2023   MICRALBCREAT NOTE 06/13/2022    A/P: Diabetes currently uncontrolled with a most recent A1c of >18.5% on 11/27/24. Patient endorses inconsistent BG testing. Reported home BG readings are close to goal, but I worry that these are likely inaccurate. Patient is able to verbalize appropriate hypoglycemia management plan. Medication adherence appears suboptimal d/t recent loss of insurance. Patient reports she has been consistently taking insulin  therapy, but not metformin  d/t GI upset. She was historically managed on GLP1, however, has been out of this d/t cost. Will discontinue metformin  and initiate GLP1 therapy at this time.  -Continued basal insulin  Basaglar  (insulin  glargine)  15 units BID -Continued rapid insulin  Humalog  (insulin  lispro) 5 units TID.  -Restarted GLP-1 Trulicity  (dulaglutide ) 0.75 mg weekly via [facility] at [facility] -Discontinued metformin .  -Patient educated on purpose, proper use, and potential adverse effects of Trulicity .  -Extensively discussed pathophysiology of diabetes, recommended  lifestyle interventions, dietary effects on blood sugar control.  -Counseled on s/sx of and management of hypoglycemia.  -Next A1c anticipated 02/2025. -Placed and educated on Freestyle Libre 3+ in clinic.     ASCVD risk - primary prevention in patient with diabetes. Last LDL is 93 mg/dL, not at goal of <29 mg/dL. Will restart statin therapy at this time  -Restarted rosuvastatin  20 mg daily.    Patient verbalized understanding of treatment plan. Total time patient counseling 45 minutes.  Follow-up:  Pharmacist on 01/21/25 PCP clinic visit in 3 months  Jane Morrison, PharmD, BCACP, CPP Clinical Pharmacist [facility] [phone]   "

## 2024-12-26 NOTE — Progress Notes (Signed)
 "  BP 132/88   Pulse 71   Temp (!) 97.5 F (36.4 C)   Ht 5' 8 (1.727 m)   Wt 152 lb (68.9 kg)   SpO2 97%   BMI 23.11 kg/m    Subjective:    Patient ID: Jane Morrison, female    DOB: Nov 21, 1961, 64 y.o.   MRN: [id number]  HPI: Jane Morrison is a 64 y.o. female  Chief Complaint  Patient presents with   Medical Management of Chronic Issues    Pt c/o cloudy urine and itchiness that started while in the hospital.    Discussed the use of AI scribe software for clinical note transcription with the patient, who gave verbal consent to proceed.  History of Present Illness Jane Morrison is a 64 year old female with uncontrolled type 2 diabetes who presents for a hospital follow-up.  Hyperglycemia and diabetes management - Recent hospitalization from December 17 to November 29, 2024, for uncontrolled type 2 diabetes with hyperglycemia. - Blood glucose in the ER was 794 mg/dL with an anion gap of 16. - Hemoglobin A1c on November 27, 2024, was greater than 18.5%. - Discharged with instructions for metformin  500 mg twice daily, Humalog  5 units three times daily with meals, and Basaglar  15 units twice daily. - Tresiba  was discontinued at discharge. - Stopped taking metformin  due to personal preference. - Not taking Trulicity , although a new prescription for Trulicity  0.75 mg weekly was sent today. - Continues Basaglar  15 units twice daily and Humalog  5 units three times daily with meals. - Met with pharmacist today and started continuous glucose monitoring. - Downloaded glucose monitoring app on her phone. - Actively trying to manage blood sugar levels.  Genitourinary symptoms - Cloudy urine and vaginal itching. - Frequent emergency room visits for these symptoms. - Advised to drink water and cranberry juice. - Diagnosed with urinary tract infection on November 27, 2024, but did not receive antibiotics. - Mild vaginal discharge without odor. - Husband desires  intimacy.  Cardiovascular and metabolic conditions - History of hyperlipidemia, heart failure, and hypertension. - Currently taking rosuvastatin  20 mg daily. - Out of Entresto  and unable to afford it due to lack of insurance.  Neurological and constitutional symptoms - Episode of lightheadedness and syncope at [facility] the week before Christmas. - Associated symptoms included tightness in the head, excessive thirst, and frequent urination.    Admit date:     11/27/2024  Discharge date: 11/29/2024  Discharge Physician: Drue ONEIDA Potter    PCP: Jane Mliss FALCON, FNP    Recommendations at discharge:  Follow-up with PCP   Discharge Diagnoses: Principal Problem:   Uncontrolled type 2 diabetes mellitus with hyperosmolar nonketotic hyperglycemia (HCC) Active Problems:   Hypertensive urgency   Hyponatremia   Dyslipidemia   Chronic HFrEF (heart failure with reduced ejection fraction) (HCC)   Resolved Problems:   * No resolved hospital problems. [facility] Course: From HPI Jane Morrison is a 64 y.o. African-American female with medical history significant for anxiety, depression, CHF, type 2 diabetes mellitus, hypertension and dyslipidemia, who presented to the emergency room with acute onset of lightheadedness, dizziness and headache mainly in the frontal area.  She has been having intermittent dizziness over the last month.  This is associated with gait abnormality with imbalance.  She denies any focal paresthesias or muscle weakness.  No dysphagia or dysarthria.  No tinnitus or vertigo.  No recent falls or trauma.  She admitted to running out  of her insulin  and stated that she has been been unable to afford the last month.  No nausea or vomiting or diarrhea.  No dysuria, oliguria or hematuria or flank pain.  She admits to polyuria and polydipsia.  No bleeding diathesis.   ED Course: When the patient came to the ER, BP was 139/102 with otherwise normal vital signs.  Labs revealed  hyponatremia of 124 and hypochloremia of 88 with a CO2 21, blood glucose of 794 and anion gap of 16 with creatinine 1.25.  High-sensitivity troponin was less than 15 CBC was within normal.  Beta-hydroxybutyrate was normal at 0.15. EKG as reviewed by me : 12-lead EKG revealed normal sinus rhythm at a rate of 70 with possible left atrial enlargement, left axis deviation and minimal voltage criteria for LVH. Imaging: MRI of the brain without contrast revealed moderately advanced periventricular and subcortical T2 hyperdensities for age likely the sequelae of chronic microvascular ischemia.   The patient was given 1 L bolus of IV normal saline.  She will be admitted to a stepdown unit bed for further evaluation and management.     Other hospital course as noted below:   Assessment and Plan: Uncontrolled type 2 diabetes mellitus with hyperosmolar nonketotic hyperglycemia (HCC) Patient's glucose level improved Continue basal insulin  therapy Patient does not have insurance and so TOC consulted   Hypertensive urgency Continue home medication   Hyponatremia -Improved continue to monitor   Dyslipidemia - Will continue statin therapy.   Chronic HFrEF (heart failure with reduced ejection fraction) (HCC) Continue Coreg  and Entresto  as well as Aldactone .       Consultants: None Procedures performed: None Disposition: Home Diet recommendation:  Cardiac diet DISCHARGE MEDICATION: Allergies as of 11/29/2024   No Known Allergies         Medication List       STOP taking these medications     mebendazole  100 MG chewable tablet Commonly known as: VERMOX     Tresiba  FlexTouch 100 UNIT/ML FlexTouch Pen Generic drug: insulin  degludec           TAKE these medications     aspirin  81 MG chewable tablet Chew 1 tablet (81 mg total) by mouth daily.    Basaglar  KwikPen 100 UNIT/ML Inject 15 Units into the skin 2 (two) times daily. Notes to patient: Last given yesterday     BD Pen  Needle Micro U/F 32G X 6 MM Misc Generic drug: Insulin  Pen Needle Use to administer insulin  What changed: Another medication with the same name was added. Make sure you understand how and when to take each.    Insupen Pen Needles 32G X 4 MM Misc Generic drug: Insulin  Pen Needle Use 3 (three) times daily to inject insulin . What changed: You were already taking a medication with the same name, and this prescription was added. Make sure you understand how and when to take each.    blood glucose meter kit and supplies Kit Dispense based on patient and insurance preference. Use up to four times daily as directed. (FOR ICD-9 250.00, 250.01).    carvedilol  25 MG tablet Commonly known as: COREG  Take 1 tablet (25 mg total) by mouth 2 (two) times daily. Notes to patient: Last given today at 8:52    Gvoke HypoPen  1-Pack 0.5 MG/0.1ML Soaj Generic drug: Glucagon  Inject 0.5 mg into the skin once as needed for up to 1 dose (for hypoglycemia).    HumaLOG  KwikPen 100 UNIT/ML KwikPen Generic drug: insulin  lispro Inject 5 Units into  the skin 3 (three) times daily.    meclizine  25 MG tablet Commonly known as: ANTIVERT  Take 1 tablet (25 mg total) by mouth 3 (three) times daily as needed.    metFORMIN  500 MG tablet Commonly known as: GLUCOPHAGE  Take 1 tablet (500 mg total) by mouth 2 (two) times daily with a meal.    OneTouch Delica Plus Lancet30G Misc Use to check blood sugar up to four times daily as directed    OneTouch Verio Flex System w/Device Kit Use to check blood sugar up to four times daily as directed    OneTouch Verio test strip Generic drug: glucose blood Use to check blood sugar up to four times daily as directed    rosuvastatin  20 MG tablet Commonly known as: CRESTOR  Take 1 tablet (20 mg total) by mouth daily. Notes to patient: Last given today at 8:52 AM    sacubitril -valsartan  97-103 MG Commonly known as: Entresto  Take 1 tablet by mouth 2 (two) times daily.     spironolactone  25 MG tablet Commonly known as: ALDACTONE  TAKE 1 TABLET (25 MG TOTAL) BY MOUTH DAILY. Notes to patient: Last given today at 8:52 AM    Trulicity  1.5 MG/0.5ML Soaj Generic drug: Dulaglutide  Inject 1.5 mg into the skin once a week.               04/24/2024   10:00 AM 01/03/2024    9:57 AM 10/31/2023    7:48 AM  Depression screen PHQ 2/9  Decreased Interest 0 0 0  Down, Depressed, Hopeless 0 0 0  PHQ - 2 Score 0 0 0  Altered sleeping 0    Tired, decreased energy 0    Change in appetite 0    Feeling bad or failure about yourself  0    Trouble concentrating 0    Moving slowly or fidgety/restless 0    Suicidal thoughts 0    PHQ-9 Score 0     Difficult doing work/chores Not difficult at all       Data saved with a previous flowsheet row definition    Relevant past medical, surgical, family and social history reviewed and updated as indicated. Interim medical history since our last visit reviewed. Allergies and medications reviewed and updated.  Review of Systems  Ten systems reviewed and is negative except as mentioned in HPI      Objective:      BP 132/88   Pulse 71   Temp (!) 97.5 F (36.4 C)   Ht 5' 8 (1.727 m)   Wt 152 lb (68.9 kg)   SpO2 97%   BMI 23.11 kg/m    Wt Readings from Last 3 Encounters:  12/26/24 152 lb (68.9 kg)  12/24/24 147 lb (66.7 kg)  11/27/24 150 lb (68 kg)    Physical Exam GENERAL: Alert, cooperative, well developed, no acute distress HEENT: Normocephalic, normal oropharynx, moist mucous membranes CHEST: Clear to auscultation bilaterally, No wheezes, rhonchi, or crackles CARDIOVASCULAR: Normal heart rate and rhythm, S1 and S2 normal without murmurs ABDOMEN: Soft, non-tender, non-distended, without organomegaly, Normal bowel sounds EXTREMITIES: No cyanosis or edema NEUROLOGICAL: Cranial nerves grossly intact, Moves all extremities without gross motor or sensory deficit  Results for orders placed or performed in visit  on 12/26/24  POCT urinalysis dipstick   Collection Time: 12/26/24 11:28 AM  Result Value Ref Range   Color, UA yellow    Clarity, UA clear    Glucose, UA Positive (A) Negative   Bilirubin, UA negative  Ketones, UA negative    Spec Grav, UA 1.015 (A) 1.010 - 1.025   Blood, UA negative    pH, UA 5.0 5.0 - 8.0   Protein, UA Negative Negative   Urobilinogen, UA 0.2 0.2 or 1.0 E.U./dL   Nitrite, UA negative    Leukocytes, UA Negative Negative   Appearance     Odor            Assessment & Plan:   Problem List Items Addressed This Visit       Cardiovascular and Mediastinum   Essential hypertension   Relevant Medications   valsartan  (DIOVAN ) 40 MG tablet   Chronic HFrEF (heart failure with reduced ejection fraction) (HCC)   Relevant Medications   valsartan  (DIOVAN ) 40 MG tablet     Endocrine   Uncontrolled type 2 diabetes mellitus with hyperglycemia, with long-term current use of insulin  (HCC) - Primary   Relevant Medications   valsartan  (DIOVAN ) 40 MG tablet     Other   Mixed hyperlipidemia   Relevant Medications   valsartan  (DIOVAN ) 40 MG tablet   Other Visit Diagnoses       Hospital discharge follow-up         Urinary frequency       Relevant Medications   nitrofurantoin , macrocrystal-monohydrate, (MACROBID ) 100 MG capsule   Other Relevant Orders   Urine Culture   POCT urinalysis dipstick (Completed)     Vaginal itching       Relevant Orders   Cervicovaginal ancillary only     Acute cystitis without hematuria            Assessment and Plan Assessment & Plan Uncontrolled type 2 diabetes mellitus with hyperglycemia Recent hospitalization for uncontrolled type 2 diabetes with hyperglycemia. Blood glucose was 794 mg/dL in the ER with an J8r >81.4%. Discharged on Basaglar  15 units twice daily, Humalog  5 units three times daily with meals, and Trulicity  1.5 mg weekly. Metformin  was discontinued due to intolerance. Currently not taking Trulicity  but has been  prescribed Trulicity  0.75 mg weekly. Continuous glucose monitoring has been initiated. Untreated infections can exacerbate hyperglycemia. - Continue Basaglar  15 units twice daily. - Continue Humalog  5 units three times daily with meals. - Prescribed Trulicity  0.75 mg weekly. - Continue continuous glucose monitoring. - Follow up with pharmacist in one month. - Follow up with provider in three months.  Chronic heart failure with reduced ejection fraction Currently not taking Entresto  due to financial constraints and lack of insurance. Awaiting confirmation on insurance coverage for Entresto  or alternative medication. - Await confirmation on insurance coverage for Entresto  or alternative medication.  Essential hypertension Hypertension management is complicated by financial constraints and lack of insurance. Currently not taking Entresto . - will send in valsartan  for now till we can get her back on entresto    Mixed hyperlipidemia Currently taking rosuvastatin  20 mg daily. - Continue rosuvastatin  20 mg daily.  Urinary tract infection Previous UTI on December 17th, 2025, not treated with antibiotics. Symptoms include cloudy urine and recurrent UTI symptoms. Untreated UTI can contribute to hyperglycemia. - Obtained urine sample for culture. - Prescribed antibiotics based on previous  urine culture results.  Vaginal itching Reports vaginal itching with no odor and minimal discharge. Previous treatment with vaginal cream was ineffective. - Obtained vaginal swab for culture. - Prescribed antibiotics based on culture results.        Follow up plan: Return in about 3 months (around 03/26/2025) for follow up. "

## 2024-12-27 ENCOUNTER — Other Ambulatory Visit: Payer: Self-pay

## 2024-12-27 ENCOUNTER — Ambulatory Visit: Payer: Self-pay | Admitting: Nurse Practitioner

## 2024-12-27 DIAGNOSIS — B379 Candidiasis, unspecified: Secondary | ICD-10-CM

## 2024-12-27 DIAGNOSIS — B9689 Other specified bacterial agents as the cause of diseases classified elsewhere: Secondary | ICD-10-CM

## 2024-12-27 LAB — CERVICOVAGINAL ANCILLARY ONLY
Bacterial Vaginitis (gardnerella): POSITIVE — AB
Candida Glabrata: POSITIVE — AB
Candida Vaginitis: POSITIVE — AB
Chlamydia: NEGATIVE
Comment: NEGATIVE
Comment: NEGATIVE
Comment: NEGATIVE
Comment: NEGATIVE
Comment: NEGATIVE
Comment: NORMAL
Neisseria Gonorrhea: NEGATIVE
Trichomonas: NEGATIVE

## 2024-12-27 LAB — URINE CULTURE
MICRO NUMBER:: 17474319
Result:: NO GROWTH
SPECIMEN QUALITY:: ADEQUATE

## 2024-12-27 MED ORDER — FLUCONAZOLE 150 MG PO TABS
150.0000 mg | ORAL_TABLET | ORAL | 0 refills | Status: AC | PRN
  Filled 2024-12-27: qty 2, 6d supply, fill #0

## 2024-12-27 MED ORDER — METRONIDAZOLE 500 MG PO TABS
500.0000 mg | ORAL_TABLET | Freq: Two times a day (BID) | ORAL | 0 refills | Status: AC
  Filled 2024-12-27: qty 14, 7d supply, fill #0

## 2025-01-21 ENCOUNTER — Ambulatory Visit: Payer: Self-pay

## 2025-03-26 ENCOUNTER — Ambulatory Visit: Payer: Self-pay | Admitting: Family
